# Patient Record
Sex: Male | Born: 1955
Health system: Southern US, Community
[De-identification: ages and names within clinical notes are randomized; demographics above are authoritative.]

## PROBLEM LIST (undated history)

## (undated) DIAGNOSIS — Z720 Tobacco use: Secondary | ICD-10-CM

## (undated) DIAGNOSIS — H409 Unspecified glaucoma: Secondary | ICD-10-CM

## (undated) DIAGNOSIS — K56609 Unspecified intestinal obstruction, unspecified as to partial versus complete obstruction: Secondary | ICD-10-CM

## (undated) DIAGNOSIS — R55 Syncope and collapse: Secondary | ICD-10-CM

## (undated) DIAGNOSIS — E785 Hyperlipidemia, unspecified: Secondary | ICD-10-CM

## (undated) DIAGNOSIS — I2781 Cor pulmonale (chronic): Secondary | ICD-10-CM

## (undated) DIAGNOSIS — F1010 Alcohol abuse, uncomplicated: Secondary | ICD-10-CM

## (undated) DIAGNOSIS — I10 Essential (primary) hypertension: Secondary | ICD-10-CM

## (undated) DIAGNOSIS — T7840XA Allergy, unspecified, initial encounter: Secondary | ICD-10-CM

## (undated) HISTORY — DX: Allergy, unspecified, initial encounter: T78.40XA

## (undated) HISTORY — DX: Essential (primary) hypertension: I10

## (undated) HISTORY — DX: Hyperlipidemia, unspecified: E78.5

## (undated) HISTORY — DX: Syncope and collapse: R55

## (undated) HISTORY — DX: Unspecified glaucoma: H40.9

## (undated) HISTORY — DX: Tobacco use: Z72.0

## (undated) HISTORY — DX: Alcohol abuse, uncomplicated: F10.10

## (undated) HISTORY — DX: Unspecified intestinal obstruction, unspecified as to partial versus complete obstruction: K56.609

## (undated) HISTORY — DX: Cor pulmonale (chronic): I27.81

## (undated) HISTORY — PX: TOOTH EXTRACTION: SUR596

## (undated) HISTORY — PX: ABDOMINAL SURGERY: SHX537

---

## 2001-08-14 ENCOUNTER — Emergency Department (HOSPITAL_COMMUNITY): Admission: EM | Admit: 2001-08-14 | Discharge: 2001-08-14 | Payer: Self-pay | Admitting: Emergency Medicine

## 2003-06-16 ENCOUNTER — Ambulatory Visit (HOSPITAL_COMMUNITY): Admission: RE | Admit: 2003-06-16 | Discharge: 2003-06-16 | Payer: Self-pay | Admitting: General Surgery

## 2003-08-27 HISTORY — PX: PILONIDAL CYST EXCISION: SHX744

## 2003-10-19 ENCOUNTER — Inpatient Hospital Stay (HOSPITAL_COMMUNITY): Admission: AD | Admit: 2003-10-19 | Discharge: 2003-10-24 | Payer: Self-pay | Admitting: General Surgery

## 2004-07-25 ENCOUNTER — Ambulatory Visit: Payer: Self-pay | Admitting: Family Medicine

## 2004-10-23 ENCOUNTER — Ambulatory Visit: Payer: Self-pay | Admitting: Family Medicine

## 2005-03-04 ENCOUNTER — Ambulatory Visit: Payer: Self-pay | Admitting: Family Medicine

## 2005-05-13 ENCOUNTER — Emergency Department (HOSPITAL_COMMUNITY): Admission: EM | Admit: 2005-05-13 | Discharge: 2005-05-13 | Payer: Self-pay | Admitting: Emergency Medicine

## 2005-07-25 ENCOUNTER — Ambulatory Visit: Payer: Self-pay | Admitting: Family Medicine

## 2005-10-03 ENCOUNTER — Ambulatory Visit: Payer: Self-pay | Admitting: Family Medicine

## 2006-02-19 ENCOUNTER — Ambulatory Visit: Payer: Self-pay | Admitting: Family Medicine

## 2006-06-19 ENCOUNTER — Ambulatory Visit: Payer: Self-pay | Admitting: Family Medicine

## 2006-09-02 ENCOUNTER — Emergency Department (HOSPITAL_COMMUNITY): Admission: EM | Admit: 2006-09-02 | Discharge: 2006-09-02 | Payer: Self-pay | Admitting: Emergency Medicine

## 2006-09-05 ENCOUNTER — Emergency Department (HOSPITAL_COMMUNITY): Admission: EM | Admit: 2006-09-05 | Discharge: 2006-09-05 | Payer: Self-pay | Admitting: Emergency Medicine

## 2006-09-25 ENCOUNTER — Ambulatory Visit: Payer: Self-pay | Admitting: Family Medicine

## 2006-10-01 ENCOUNTER — Encounter: Payer: Self-pay | Admitting: Family Medicine

## 2006-10-01 ENCOUNTER — Encounter (INDEPENDENT_AMBULATORY_CARE_PROVIDER_SITE_OTHER): Payer: Self-pay | Admitting: *Deleted

## 2006-10-01 LAB — CONVERTED CEMR LAB
ALT: 13 units/L (ref 0–53)
AST: 17 units/L (ref 0–37)
Albumin: 4.1 g/dL (ref 3.5–5.2)
Alkaline Phosphatase: 101 units/L (ref 39–117)
BUN: 15 mg/dL (ref 6–23)
Basophils Absolute: 0 10*3/uL (ref 0.0–0.1)
Basophils Relative: 1 % (ref 0–1)
Bilirubin, Direct: 0.1 mg/dL (ref 0.0–0.3)
CO2: 26 meq/L (ref 19–32)
Chloride: 102 meq/L (ref 96–112)
Cholesterol: 202 mg/dL — ABNORMAL HIGH (ref 0–200)
Creatinine, Ser: 1.08 mg/dL (ref 0.40–1.50)
Eosinophils Absolute: 0.1 10*3/uL (ref 0.0–0.7)
Eosinophils Relative: 2 % (ref 0–5)
Glucose, Bld: 82 mg/dL (ref 70–99)
HCT: 45 % (ref 39.0–52.0)
HDL: 52 mg/dL (ref >39)
Hemoglobin: 14.5 g/dL (ref 13.0–17.0)
Indirect Bilirubin: 0.5 mg/dL (ref 0.0–0.9)
LDL Cholesterol: 70 mg/dL (ref 0–99)
Lymphocytes Relative: 21 % (ref 12–46)
Lymphs Abs: 1.3 10*3/uL (ref 0.7–3.3)
MCHC: 32.2 g/dL (ref 30.0–36.0)
MCV: 100.9 fL — ABNORMAL HIGH (ref 78.0–100.0)
Monocytes Absolute: 0.7 10*3/uL (ref 0.2–0.7)
Monocytes Relative: 12 % — ABNORMAL HIGH (ref 3–11)
Neutro Abs: 4 10*3/uL (ref 1.7–7.7)
Neutrophils Relative %: 65 % (ref 43–77)
PSA: 1.02 ng/mL (ref 0.10–4.00)
Platelets: 346 10*3/uL (ref 150–400)
Potassium: 4.4 meq/L (ref 3.5–5.3)
RBC: 4.46 M/uL (ref 4.22–5.81)
RDW: 13.4 % (ref 11.5–14.0)
Sodium: 139 meq/L (ref 135–145)
Total Bilirubin: 0.6 mg/dL (ref 0.3–1.2)
Total CHOL/HDL Ratio: 3.9
Total Protein: 7.4 g/dL (ref 6.0–8.3)
Triglycerides: 399 mg/dL — ABNORMAL HIGH (ref <150)
VLDL: 80 mg/dL — ABNORMAL HIGH (ref 0–40)
WBC: 6.1 10*3/uL (ref 4.0–10.5)

## 2006-11-06 ENCOUNTER — Ambulatory Visit: Payer: Self-pay | Admitting: Family Medicine

## 2007-08-19 ENCOUNTER — Ambulatory Visit: Payer: Self-pay | Admitting: Family Medicine

## 2007-08-26 ENCOUNTER — Encounter (INDEPENDENT_AMBULATORY_CARE_PROVIDER_SITE_OTHER): Payer: Self-pay | Admitting: *Deleted

## 2008-01-07 ENCOUNTER — Ambulatory Visit: Payer: Self-pay | Admitting: Family Medicine

## 2008-01-12 DIAGNOSIS — F70 Mild intellectual disabilities: Secondary | ICD-10-CM | POA: Insufficient documentation

## 2008-01-12 DIAGNOSIS — J301 Allergic rhinitis due to pollen: Secondary | ICD-10-CM

## 2008-02-17 ENCOUNTER — Encounter: Payer: Self-pay | Admitting: Family Medicine

## 2008-02-17 LAB — CONVERTED CEMR LAB
BUN: 13 mg/dL (ref 6–23)
CO2: 23 meq/L (ref 19–32)
Calcium: 9.4 mg/dL (ref 8.4–10.5)
Chloride: 107 meq/L (ref 96–112)
Cholesterol: 164 mg/dL (ref 0–200)
Creatinine, Ser: 1.12 mg/dL (ref 0.40–1.50)
Glucose, Bld: 89 mg/dL (ref 70–99)
HDL: 58 mg/dL (ref >39)
LDL Cholesterol: 63 mg/dL (ref 0–99)
PSA: 1.29 ng/mL (ref 0.10–4.00)
Potassium: 4 meq/L (ref 3.5–5.3)
Sodium: 141 meq/L (ref 135–145)
Total CHOL/HDL Ratio: 2.8
Triglycerides: 213 mg/dL — ABNORMAL HIGH (ref <150)
VLDL: 43 mg/dL — ABNORMAL HIGH (ref 0–40)

## 2008-04-01 ENCOUNTER — Encounter: Payer: Self-pay | Admitting: Family Medicine

## 2008-10-19 ENCOUNTER — Ambulatory Visit: Payer: Self-pay | Admitting: Family Medicine

## 2008-10-19 DIAGNOSIS — R079 Chest pain, unspecified: Secondary | ICD-10-CM | POA: Insufficient documentation

## 2009-12-24 DIAGNOSIS — R55 Syncope and collapse: Secondary | ICD-10-CM

## 2009-12-24 HISTORY — DX: Syncope and collapse: R55

## 2010-01-09 ENCOUNTER — Encounter (INDEPENDENT_AMBULATORY_CARE_PROVIDER_SITE_OTHER): Payer: Self-pay | Admitting: Internal Medicine

## 2010-01-09 ENCOUNTER — Inpatient Hospital Stay (HOSPITAL_COMMUNITY): Admission: EM | Admit: 2010-01-09 | Discharge: 2010-01-10 | Payer: Self-pay | Admitting: Emergency Medicine

## 2010-01-12 ENCOUNTER — Encounter: Payer: Self-pay | Admitting: Family Medicine

## 2010-01-15 ENCOUNTER — Ambulatory Visit: Payer: Self-pay | Admitting: Family Medicine

## 2010-01-15 DIAGNOSIS — Z8669 Personal history of other diseases of the nervous system and sense organs: Secondary | ICD-10-CM

## 2010-01-15 DIAGNOSIS — E876 Hypokalemia: Secondary | ICD-10-CM | POA: Insufficient documentation

## 2010-02-01 ENCOUNTER — Encounter: Payer: Self-pay | Admitting: Physician Assistant

## 2010-02-06 ENCOUNTER — Encounter: Payer: Self-pay | Admitting: Physician Assistant

## 2010-02-06 LAB — CONVERTED CEMR LAB
ALT: 11 units/L (ref 0–53)
Albumin: 4 g/dL (ref 3.5–5.2)
Alkaline Phosphatase: 68 units/L (ref 39–117)
BUN: 13 mg/dL (ref 6–23)
Bilirubin, Direct: 0.1 mg/dL (ref 0.0–0.3)
CO2: 19 meq/L (ref 19–32)
Calcium: 9.2 mg/dL (ref 8.4–10.5)
Chloride: 104 meq/L (ref 96–112)
Cholesterol: 199 mg/dL (ref 0–200)
Creatinine, Ser: 1.01 mg/dL (ref 0.40–1.50)
Glucose, Bld: 95 mg/dL (ref 70–99)
HDL: 50 mg/dL (ref >39)
Indirect Bilirubin: 0.6 mg/dL (ref 0.0–0.9)
PSA: 1.46 ng/mL (ref 0.10–4.00)
Potassium: 3.8 meq/L (ref 3.5–5.3)
Sodium: 138 meq/L (ref 135–145)
TSH: 1.549 microintl units/mL (ref 0.350–4.500)
Total Bilirubin: 0.7 mg/dL (ref 0.3–1.2)
Total CHOL/HDL Ratio: 4
Total Protein: 7 g/dL (ref 6.0–8.3)
Triglycerides: 507 mg/dL — ABNORMAL HIGH (ref <150)
Vit D, 25-Hydroxy: 21 ng/mL — ABNORMAL LOW (ref 30–89)

## 2010-02-15 ENCOUNTER — Ambulatory Visit: Payer: Self-pay | Admitting: Family Medicine

## 2010-02-21 DIAGNOSIS — F1020 Alcohol dependence, uncomplicated: Secondary | ICD-10-CM | POA: Insufficient documentation

## 2010-06-05 ENCOUNTER — Ambulatory Visit: Payer: Self-pay | Admitting: Family Medicine

## 2010-06-05 DIAGNOSIS — M545 Low back pain, unspecified: Secondary | ICD-10-CM | POA: Insufficient documentation

## 2010-06-05 LAB — CONVERTED CEMR LAB
Bilirubin Urine: NEGATIVE
Glucose, Urine, Semiquant: NEGATIVE
Ketones, urine, test strip: NEGATIVE
Nitrite: NEGATIVE
Protein, U semiquant: NEGATIVE
Specific Gravity, Urine: >=1.03
Urobilinogen, UA: 0.2
WBC Urine, dipstick: NEGATIVE
pH: 6

## 2010-06-18 ENCOUNTER — Ambulatory Visit: Payer: Self-pay | Admitting: Family Medicine

## 2010-09-25 NOTE — Assessment & Plan Note (Signed)
Summary: BACK   Vital Signs:  Patient profile:   55 year old male Height:      65 inches Weight:      154 pounds BMI:     25.72 O2 Sat:      100 % Pulse rate:   86 / minute Resp:     16 per minute BP sitting:   124 / 80  (left arm)  Vitals Entered By: Everitt Amber LPN (June 05, 2010 1:34 PM) CC: c/o back and both sides hurting, started last week    CC:  c/o back and both sides hurting and started last week .  History of Present Illness: Pt states he noticed lower back and lower abd discomfort since last week.  Started after he was working mowing grass and lifted some tin.  Pain is intermittent.  Pain has improved from last week.  Pt unable to give a number on scale of 1-10. "not that bad" He denies fever, nausea, vomiting, diarrhea, constipaiton, or dysuria.    Current Medications (verified): 1)  None  Allergies (verified): No Known Drug Allergies  Past History:  Past medical history reviewed for relevance to current acute and chronic problems.  Past Medical History: Reviewed history from 01/15/2010 and no changes required. Current Problems:  ALLERGIC RHINITIS, SEASONAL (ICD-477.0) MENTAL RETARDATION, MILD (ICD-317) Syncopal episode 5/11, admitted Alcohol Abuse  Review of Systems General:  Denies chills and fever. ENT:  Denies earache, nasal congestion, and sore throat. CV:  Denies chest pain or discomfort and palpitations. Resp:  Denies cough and shortness of breath. GI:  Complains of abdominal pain; denies bloody stools, change in bowel habits, constipation, dark tarry stools, indigestion, nausea, and vomiting. GU:  Denies dysuria, hematuria, and urinary frequency. MS:  Complains of low back pain; denies joint pain, joint swelling, and mid back pain.  Physical Exam  General:  Well-developed,well-nourished,in no acute distress; alert,appropriate and cooperative throughout examination Head:  Normocephalic and atraumatic without obvious abnormalities. No  apparent alopecia or balding. Lungs:  Normal respiratory effort, chest expands symmetrically. Lungs are clear to auscultation, no crackles or wheezes. Heart:  Normal rate and regular rhythm. S1 and S2 normal without gallop, murmur, click, rub or other extra sounds. Abdomen:  soft, normal bowel sounds, no masses, no guarding, no rigidity, and no rebound tenderness.  Mild TTP across lower abd, but will be tender one time and not the next time that the same area is palpated.  Pt does c/o pain with flexion of abd muscles, and states that this is the same pain he has been getting off & on. Msk:  LS spine:  Bilat lumbar paraspinal muscle TTP, mild.  Nontender spinous processes, and SI joints.  Neg SLR Extremities:  No clubbing, cyanosis, edema, or deformity noted with normal full range of motion of all joints.   Cervical Nodes:  No lymphadenopathy noted Psych:  Cognition and judgment appear intact. Alert and cooperative with normal attention span and concentration. No apparent delusions, illusions, hallucinations   Impression & Recommendations:  Problem # 1:  BACK PAIN, LUMBAR (ICD-724.2) Assessment New  His updated medication list for this problem includes:    Ibuprofen 800 Mg Tabs (Ibuprofen) .Marland Kitchen... Take 1 three times a day as needed back pain  Orders: UA Dipstick w/o Micro (automated)  (81003) Depo- Medrol 80mg  (J1040) Ketorolac-Toradol 15mg  (W2376) Admin of Therapeutic Inj  intramuscular or subcutaneous (28315)  Problem # 2:  MENTAL RETARDATION, MILD (ICD-317) Assessment: Comment Only  Complete Medication List: 1)  Ibuprofen 800 Mg Tabs (Ibuprofen) .... Take 1 three times a day as needed back pain  Other Orders: Influenza Vaccine MCR (27253)  Patient Instructions: 1)  Keep your next scheduled appt. 2)  You have received a flu shot, and 2 shots for your back today. 3)  I have prescribed Ibuprofen to help with your back. 4)  Avoid heavy lifting. 5)  You may use some heat on your back  also as needed for discomfort. Prescriptions: IBUPROFEN 800 MG TABS (IBUPROFEN) take 1 three times a day as needed back pain  #30 x 0   Entered and Authorized by:   Esperanza Sheets PA   Signed by:   Esperanza Sheets PA on 06/05/2010   Method used:   Electronically to        [facility]. [address] [phone]* (retail)       [address] [place], [place]  [address]       Ph: [phone]       Fax: [phone]   RxID:   [id number]     Influenza Vaccine    Vaccine Type: Fluvax MCR    Site: left deltoid    Mfr: [email or web address]    Dose: 0.5 ml    Route: IM    Given by: Everitt Amber LPN    Exp. Date: 12/2010    Lot #: [id number]    Laboratory Results   Urine Tests    Routine Urinalysis   Color: yellow Appearance: Clear Glucose: negative   (Normal Range: Negative) Bilirubin: negative   (Normal Range: Negative) Ketone: negative   (Normal Range: Negative) Spec. Gravity: >=1.030   (Normal Range: 1.003-1.035) Blood: negative   (Normal Range: Negative) pH: 6.0   (Normal Range: 5.0-8.0) Protein: negative   (Normal Range: Negative) Urobilinogen: 0.2   (Normal Range: 0-1) Nitrite: negative   (Normal Range: Negative) Leukocyte Esterace: negative   (Normal Range: Negative)         Medication Administration  Injection # 1:    Medication: Depo- Medrol 80mg     Diagnosis: BACK PAIN, LUMBAR (ICD-724.2)    Route: IM    Site: RUOQ gluteus    Exp Date: 11/2010    Lot #: [id number]     Mfr: Pharmacia    Comments: 80mg  given     Patient tolerated injection without complications    Given by: Everitt Amber LPN (June 05, 2010 2:31 PM)  Injection # 2:    Medication: Ketorolac-Toradol 15mg     Diagnosis: BACK PAIN, LUMBAR (ICD-724.2)    Route: IM    Site: LUOQ gluteus    Exp Date: 06/2011    Lot #: [id number]     Mfr: novaplus    Comments: 60mg  given     Patient tolerated injection without complications    Given by: Everitt Amber LPN (June 05, 2010 2:32 PM)  Orders Added: 1)  Influenza  Vaccine MCR [00025] 2)  UA Dipstick w/o Micro (automated)  [81003] 3)  Depo- Medrol 80mg  [J1040] 4)  Ketorolac-Toradol 15mg  [J1885] 5)  Admin of Therapeutic Inj  intramuscular or subcutaneous [96372] 6)  Est. Patient Level III [57322]

## 2010-09-25 NOTE — Assessment & Plan Note (Signed)
Summary: hospital follow up - room 1   Vital Signs:  Patient profile:   55 year old male Height:      65 inches Weight:      151.75 pounds BMI:     25.34 O2 Sat:      100 % on Room air Pulse rate:   68 / minute Resp:     16 per minute BP sitting:   120 / 28  (left arm)  Vitals Entered By: Adella Hare LPN (Jan 15, 2010 1:48 PM) CC: hospital follow up- room 1 Is Patient Diabetic? No Pain Assessment Patient in pain? no        CC:  hospital follow up- room 1.  History of Present Illness: Pt is here today for hosp f/u.  He was discharged 01/10/10 after a syncopal episode.  It is felt that this occured secondary to his mixing alcohol and vicoden.  He had an EEG which is negative.  He has an appt with the cardiologist on 01-29-10.  He states he has been feeling well since out of the hosp.  No dizziness, lightheadedness or syncope.  No chest pain, palp or difficulty breathing.  He has been having some sneezing and nasal congestion due to his allergies.    Pt states he has resumed drinking ETOH.  He has 3-4 drinks per day of liquer.  I am unable to pinpoint any further with pt or his caregiver exactly how much he is drinking.  When I ask if it is a fifth a day they say no.  When I ask if it's a pint a day, they are uncertain.  Current Medications (verified): 1)  Aspir-Low 81 Mg Tbec (Aspirin) .... One Tab By Mouth Once Daily  Allergies (verified): No Known Drug Allergies  Past History:  Past medical, surgical, family and social histories (including risk factors) reviewed, and no changes noted (except as noted below).  Past Medical History: Current Problems:  ALLERGIC RHINITIS, SEASONAL (ICD-477.0) MENTAL RETARDATION, MILD (ICD-317) Syncopal episode 5/11, admitted Alcohol Abuse  Past Surgical History: Reviewed history from 08/26/2007 and no changes required. Left Inguinal herniorrhaphy I&D rectal abcess 2005  Family History: Reviewed history from 08/26/2007 and no changes  required. FATHER DECEASED - HEART DZ MOTHER DECEASED - DIABETES 0 SIBLINGS  Social History: Reviewed history from 08/26/2007 and no changes required. DISABLED SINGLE 0 CHILDREN Current Smoker Alcohol use-yes,  Drug use-no  Review of Systems CV:  Denies chest pain or discomfort, lightheadness, and palpitations. Resp:  Denies shortness of breath. GI:  Denies abdominal pain, nausea, and vomiting. Neuro:  Denies headaches, numbness, tingling, and weakness.  Physical Exam  General:  Well-developed,well-nourished,in no acute distress; alert,appropriate and cooperative throughout examination Head:  Normocephalic and atraumatic without obvious abnormalities. No apparent alopecia or balding. Ears:  External ear exam shows no significant lesions or deformities.  Otoscopic examination reveals clear canals, tympanic membranes are intact bilaterally without bulging, retraction, inflammation or discharge. Hearing is grossly normal bilaterally. Nose:  External nasal examination shows no deformity or inflammation. Nasal mucosa are pink and moist without lesions or exudates. Mouth:  Oral mucosa and oropharynx without lesions or exudates.  poor dentition and teeth missing.   Neck:  No deformities, masses, or tenderness noted. Lungs:  Normal respiratory effort, chest expands symmetrically. Lungs are clear to auscultation, no crackles or wheezes. Heart:  Normal rate and regular rhythm. S1 and S2 normal without gallop, murmur, click, rub or other extra sounds. Abdomen:  soft, non-tender, no masses, no  hepatomegaly, and no splenomegaly.   Cervical Nodes:  No lymphadenopathy noted Psych:  normally interactive, good eye contact, and not anxious appearing.     Impression & Recommendations:  Problem # 1:  SYNCOPE, HX OF (ICD-V12.49) Assessment Comment Only  Orders: T-Basic Metabolic Panel [id number]) T-TSH [phone])  Problem # 2:  HYPOKALEMIA (ICD-276.8) Assessment: Comment Only Hypokalemic  at admission.  Needs f/u labs.  Orders: T-Electrolytes [id number])  Problem # 3:  ALLERGIC RHINITIS, SEASONAL (ICD-477.0) Assessment: Deteriorated  Complete Medication List: 1)  Aspir-low 81 Mg Tbec (Aspirin) .... One tab by mouth once daily 2)  Fexofenadine Hcl 180 Mg Tabs (Fexofenadine hcl) .... Take 1 daily for allergies  Other Orders: T-Lipid Profile [id number]) T-Vitamin D (25-Hydroxy) [id number]) T-PSA [phone])  Patient Instructions: 1)  Please schedule a follow-up appointment in 1 month wit Dr Lodema Hong. 2)  Keep your appt June 6th with the cardiologist. 3)  I have ordered blood work for you.  This needs to be drawn fasting. 4)  It is not healthy  for men to drink more than 2-3 drinks per day or for women to drink more than 1-2 drinks per day. Prescriptions: FEXOFENADINE HCL 180 MG TABS (FEXOFENADINE HCL) take 1 daily for allergies  #30 x 3   Entered and Authorized by:   Esperanza Sheets PA   Signed by:   Esperanza Sheets PA on 01/15/2010   Method used:   Electronically to        [facility]. [address] [phone]* (retail)       [address] [place], [place]  [address]       Ph: [phone]       Fax: [phone]   RxID:   [id number]

## 2010-09-25 NOTE — Letter (Signed)
Summary: Letter  Letter   Imported By: Lind Guest 01/12/2010 10:18:40  _____________________________________________________________________  External Attachment:    Type:   Image     Comment:   External Document

## 2010-09-25 NOTE — Assessment & Plan Note (Signed)
Summary: F UP   Vital Signs:  Patient profile:   55 year old male Height:      65 inches Weight:      149 pounds BMI:     24.88 O2 Sat:      98 % Pulse rate:   77 / minute Pulse rhythm:   irregular Resp:     16 per minute BP sitting:   124 / 82  (left arm) Cuff size:   regular  Vitals Entered By: Everitt Amber LPN (February 15, 2010 3:25 PM) CC: Follow up chronic problems   CC:  Follow up chronic problems.  History of Present Illness: Pt in for f/u from recent hospitalisation for alcohol intoxication. He remains in denia about this problem unfoertunately, however i stroongly encourged him to join an AA program.  Denies recent fever or chills. Denies sinus pressure, nasal congestion , ear pain or sore throat. Denies chest congestion, or cough productive of sputum. Denies chest pain, palpitations, PND, orthopnea or leg swelling. Denies abdominal pain, nausea, vomitting, diarrhea or constipation. Denies change in bowel movements or bloody stool. Denies dysuria , frequency, incontinence or hesitancy. Denies  joint pain, swelling, or reduced mobility. Denies headaches, vertigo, seizures. Denies depression, anxiety or insomnia. Denies  rash, lesions, or itch.     Preventive Screening-Counseling & Management  Alcohol-Tobacco     Smoking Cessation Counseling: yes  Current Medications (verified): 1)  None  Allergies (verified): No Known Drug Allergies  Review of Systems      See HPI Eyes:  Denies discharge and red eye. Neuro:  Complains of memory loss; mild mntal retardation. Endo:  Denies cold intolerance, excessive hunger, excessive thirst, excessive urination, heat intolerance, polyuria, and weight change. Heme:  Denies abnormal bruising and bleeding. Allergy:  Complains of seasonal allergies; mild.  Physical Exam  General:  Well-developed,well-nourished,in no acute distress; alert,appropriate and cooperative throughout examination HEENT: No facial asymmetry,  EOMI,  No sinus tenderness, TM's Clear, oropharynx  pink and moist. Poor dentition  Chest: Clear to auscultation bilaterally.  CVS: S1, S2, No murmurs, No S3.   Abd: Soft, Nontender.  MS: Adequate ROM spine, hips, shoulders and knees.  Ext: No edema.   CNS: CN 2-12 intact, power tone and sensation normal throughout.   Skin: Intact, no visible lesions or rashes.  Psych: Good eye contact, normal affect.  Memory impairment, not anxious or depressed appearing.    Impression & Recommendations:  Problem # 1:  ALLERGIC RHINITIS, SEASONAL (ICD-477.0) Assessment Improved  Problem # 2:  ALCOHOLISM (ICD-303.90) Assessment: Comment Only counselled re need to quit and join a saupport gp, insight seems to be poor  Problem # 3:  HYPERLIPIDEMIA (ICD-272.4) Assessment: Deteriorated  Labs Reviewed: SGOT: 21 (02/06/2010)   SGPT: 11 (02/06/2010)   HDL:50 (02/01/2010), 58 (02/17/2008)  LDL:See Comment mg/dL (16/05/9603), 63 (54/04/8118)  Chol:199 (02/01/2010), 164 (02/17/2008)  Trig:507 (02/01/2010), 213 (02/17/2008) low fat diet discussed and encouraged  Problem # 4:  NICOTINE ADDICTION (ICD-305.1) Assessment: Unchanged  Encouraged smoking cessation and discussed different methods for smoking cessation.   Patient Instructions: 1)  Please schedule a follow-up appointment in 4 months. 2)  It is not healthy  for men to drink more than 2-3 drinks per day or for women to drink more than 1-2 drinks per day. 3)  Tobacco is very bad for your health and your loved ones! You Should stop smoking!. 4)  Stop Smoking Tips: Choose a Quit date. Cut down before the Quit date. decide what  you will do as a substitute when you feel the urge to smoke(gum,toothpick,exercise). 5)  continue your allergy pills. 6)  You need to go to AA.

## 2010-11-12 LAB — RAPID URINE DRUG SCREEN, HOSP PERFORMED
Amphetamines: NOT DETECTED
Barbiturates: NOT DETECTED
Benzodiazepines: NOT DETECTED
Opiates: POSITIVE — AB
Tetrahydrocannabinol: NOT DETECTED

## 2010-11-12 LAB — BASIC METABOLIC PANEL
BUN: 7 mg/dL (ref 6–23)
BUN: 7 mg/dL (ref 6–23)
CO2: 27 mEq/L (ref 19–32)
Calcium: 9.1 mg/dL (ref 8.4–10.5)
Chloride: 108 mEq/L (ref 96–112)
GFR calc non Af Amer: >60 mL/min (ref >60)
Glucose, Bld: 113 mg/dL — ABNORMAL HIGH (ref 70–99)
Potassium: 3.5 mEq/L (ref 3.5–5.1)
Sodium: 138 mEq/L (ref 135–145)
Sodium: 139 mEq/L (ref 135–145)

## 2010-11-12 LAB — CBC
HCT: 32.4 % — ABNORMAL LOW (ref 39.0–52.0)
Hemoglobin: 11.6 g/dL — ABNORMAL LOW (ref 13.0–17.0)
MCHC: 35.3 g/dL (ref 30.0–36.0)
MCV: 98.1 fL (ref 78.0–100.0)
MCV: 99.7 fL (ref 78.0–100.0)
Platelets: 248 10*3/uL (ref 150–400)
Platelets: 259 10*3/uL (ref 150–400)
RBC: 3.3 MIL/uL — ABNORMAL LOW (ref 4.22–5.81)
RBC: 3.8 MIL/uL — ABNORMAL LOW (ref 4.22–5.81)
RDW: 14.8 % (ref 11.5–15.5)
WBC: 6.6 10*3/uL (ref 4.0–10.5)

## 2010-11-12 LAB — COMPREHENSIVE METABOLIC PANEL
AST: 19 U/L (ref 0–37)
CO2: 25 mEq/L (ref 19–32)
Calcium: 9 mg/dL (ref 8.4–10.5)
Creatinine, Ser: 1.47 mg/dL (ref 0.4–1.5)
GFR calc Af Amer: >60 mL/min (ref >60)
GFR calc non Af Amer: 50 mL/min — ABNORMAL LOW (ref >60)
Sodium: 138 mEq/L (ref 135–145)
Total Protein: 6.8 g/dL (ref 6.0–8.3)

## 2010-11-12 LAB — DIFFERENTIAL
Eosinophils Absolute: 0.1 10*3/uL (ref 0.0–0.7)
Eosinophils Relative: 1 % (ref 0–5)
Eosinophils Relative: 1 % (ref 0–5)
Lymphocytes Relative: 15 % (ref 12–46)
Lymphocytes Relative: 18 % (ref 12–46)
Lymphs Abs: 1.2 10*3/uL (ref 0.7–4.0)
Lymphs Abs: 1.3 10*3/uL (ref 0.7–4.0)
Monocytes Absolute: 0.6 10*3/uL (ref 0.1–1.0)
Monocytes Relative: 5 % (ref 3–12)
Monocytes Relative: 9 % (ref 3–12)

## 2010-11-12 LAB — CARDIAC PANEL(CRET KIN+CKTOT+MB+TROPI)
CK, MB: 1.1 ng/mL (ref 0.3–4.0)
CK, MB: 1.2 ng/mL (ref 0.3–4.0)
Relative Index: 1 (ref 0.0–2.5)
Total CK: 107 U/L (ref 7–232)
Total CK: 113 U/L (ref 7–232)

## 2010-11-12 LAB — TSH: TSH: 2.584 u[IU]/mL (ref 0.350–4.500)

## 2010-11-12 LAB — CK TOTAL AND CKMB (NOT AT ARMC): Total CK: 141 U/L (ref 7–232)

## 2010-11-12 LAB — POCT CARDIAC MARKERS
CKMB, poc: <1 ng/mL — ABNORMAL LOW (ref 1.0–8.0)
Myoglobin, poc: 77.2 ng/mL (ref 12–200)

## 2010-11-12 LAB — ACETAMINOPHEN LEVEL: Acetaminophen (Tylenol), Serum: 10.5 ug/mL (ref 10–30)

## 2010-11-12 LAB — GLUCOSE, CAPILLARY: Glucose-Capillary: 190 mg/dL — ABNORMAL HIGH (ref 70–99)

## 2010-11-12 LAB — HEMOGLOBIN A1C: Hgb A1c MFr Bld: 5.9 % — ABNORMAL HIGH (ref <5.7)

## 2010-11-12 LAB — MAGNESIUM: Magnesium: 1.8 mg/dL (ref 1.5–2.5)

## 2011-01-11 NOTE — H&P (Signed)
NAME:  Alexander Simmons, Alexander Simmons                          ACCOUNT NO.:  [id number]   MEDICAL RECORD NO.:  [id number]                   PATIENT TYPE:  INP   LOCATION:  A325                                 FACILITY:  [facility]   PHYSICIAN:  Dirk Dress. Katrinka Blazing, M.D.                DATE OF BIRTH:  Aug 25, 1956   DATE OF ADMISSION:  10/19/2003  DATE OF DISCHARGE:                                HISTORY & PHYSICAL   This is a 55 year old male with a history of a painful mass of his left  buttock for 2 weeks.  The mass is becoming increasingly painful.  He noticed  some drainage on the evening prior to admission.  He was seen in the office  where he was noted to have a large bulging abscess of the left buttock with  extension down to the perirectal space.  It is felt to probably be a large  pilonidal, but it may be a primary perirectal abscess with extension to the  buttock.  The patient is admitted.  Will have IV antibiotics and will be  scheduled for wide excision under anesthesia.   PAST HISTORY:  He has no history of similar inflammatory disease of the  perianal area.  He had a colonoscopy in October 2004, with a single polyp  noted.  He uses no medications except for eyedrops for glaucoma.  He does  not admit to any medical problems though there is a prolonged history of  alcohol and drug abuse.   SURGERY:  None.   SOCIAL HISTORY:  He is single, disabled, with a history of alcohol and  tobacco abuse.  He drinks a 6-pack of beer a day.  Smokes about 3 cigars per  day.  He chews tobacco daily.  The source of his disability is not known.  He does not know why he has been declared disabled.   FAMILY HISTORY:  Not known.   ALLERGIES:  He has no known allergies.   REVIEW OF SYSTEMS:  Very difficult to obtain.  He does not admit to any  problems.   PHYSICAL EXAMINATION:  GENERAL:  Middle-aged male who appears in no acute  distress though he walks bent over.  VITAL SIGNS:  Blood pressure 126/84, pulse 84,  respirations 20.  Weight 140-  1/2 pounds.  HEENT:  Unremarkable except for inflammation of the sclerae and  conjunctivae.  NECK:  Supple.  No JVD or bruit.  No adenopathy or thyromegaly.  CHEST:  Clear to auscultation.  No rales, rubs, rhonchi, or wheezes.  HEART:  Regular rate and rhythm.  Without murmur, gallop, or rub.  ABDOMEN:  Soft and nontender.  No masses.  Normoactive bowel sounds.  RECTAL:  In the perirectal area he has some inflammation extending from the  medial aspect of the left buttock down to the posterior perirectal space  with a pointing draining mass with surrounding inflammation along the medial  aspect of the buttock.  EXTREMITIES:  No cyanosis, clubbing, or edema.  NEUROLOGIC:  No focal motor, sensory, or cerebellar deficit.   IMPRESSION:  Perianal inflammatory disease, probably due to pilonidal  abscess but possibly a perirectal abscess with posterior extension.   PLAN:  The patient will receive IV antibiotics.  Will get baseline  laboratories.  He will receive IV analgesics, and he will have wide excision  of the area under anesthesia on the morning of October 20, 2003.     ___________________________________________                                         Dirk Dress. Katrinka Blazing, M.D.   LCS/MEDQ  D:  10/19/2003  T:  10/20/2003  Job:  [id number]

## 2011-01-11 NOTE — Discharge Summary (Signed)
NAME:  Alexander Simmons, Alexander Simmons                          ACCOUNT NO.:  [id number]   MEDICAL RECORD NO.:  [id number]                   PATIENT TYPE:  INP   LOCATION:  A325                                 FACILITY:  [facility]   PHYSICIAN:  Dirk Dress. Katrinka Blazing, M.D.                DATE OF BIRTH:  Feb 04, 1956   DATE OF ADMISSION:  10/19/2003  DATE OF DISCHARGE:  10/24/2003                                 DISCHARGE SUMMARY   DISCHARGE DIAGNOSES:  1. Pilonidal abscess.  2. Chronic alcohol abuse.   SPECIAL PROCEDURE:  Wide excision of pilonidal abscess on February 24.   DISPOSITION:  The patient is discharged home in stable, satisfactory  condition.   DISCHARGE MEDICATIONS:  Doxycycline 100 mg b.i.d.   DISCHARGE INSTRUCTIONS:  1. He was instructed in wound care.  2. He was scheduled to be followed up in the office in two weeks.  3. He is to continue the pain medicine that he had preoperatively.   SUMMARY:  A 55 year old male with a history of a painful mass of his left  buttocks for two weeks.  He noted draining on the evening prior to  admission.  He was seen in the office and was noted to have a large bulging  abscess of the left buttocks with extension down to the perirectal space.  It was felt that he had a large pilonidal with perirectal extension.  It was  felt this needed to be approached in the OR.  He was admitted and started on  IV antibiotics.  He was scheduled for Wide excision.   General exam was otherwise unremarkable.  The patient underwent Wide  excision of a pilonidal abscess on February 24.  The wound was left open,  and local wound care was carried out.  He was given daily wound care.  He  had no problems while hospitalized.  The wound showed evidence of healing.   DISCHARGE CONDITION:  He was discharged home in satisfactory condition on  February 28.     ___________________________________________                                         Dirk Dress. Katrinka Blazing, M.D.   LCS/MEDQ  D:   11/19/2003  T:  11/20/2003  Job:  [id number]

## 2011-01-11 NOTE — Op Note (Signed)
NAME:  Alexander Simmons, Alexander Simmons                          ACCOUNT NO.:  [id number]   MEDICAL RECORD NO.:  [id number]                   PATIENT TYPE:  INP   LOCATION:  A325                                 FACILITY:  [facility]   PHYSICIAN:  Dirk Dress. Katrinka Blazing, M.D.                DATE OF BIRTH:  06/19/1956   DATE OF PROCEDURE:  10/20/2003  DATE OF DISCHARGE:                                 OPERATIVE REPORT   PREOPERATIVE DIAGNOSIS:  Pilonidal abscess.   POSTOPERATIVE DIAGNOSIS:  Pilonidal abscess.   PROCEDURE:  Wide excision of pilonidal abscess.   SURGEON:  Dirk Dress. Katrinka Blazing, M.D.   DESCRIPTION OF PROCEDURE:  Under spinal anesthesia, the presacral left  medial buttock was prepped and draped in a sterile field.   Wide excision of the roof of the bulging cavity was carried out.  Cultures  of the large volume of purulent material was removed.  Irrigation was  carried out.  Fractionation of loculations of pus were carried out.  Further  irrigation was carried out.  The walls of the abscess cavity were curetted  with a standard medium-size curette.  Hemostasis was achieved.  Further  irrigation was carried out.  The pocket was dissected down to the midline,  but the actual opening from the midline was never found.  The widely opened  area was packed with iodoform gauze.  It was covered with an ABD pad and 4 x  4s.   The patient tolerated the procedure well.  He was transferred to a bed and  taken to the postanesthetic care unit for further monitoring.      ___________________________________________                                            Dirk Dress. Katrinka Blazing, M.D.   LCS/MEDQ  D:  10/20/2003  T:  10/20/2003  Job:  [id number]

## 2011-01-11 NOTE — H&P (Signed)
   NAME:  Alexander Simmons, Alexander Simmons.                         ACCOUNT NO.:  [id number]   MEDICAL RECORD NO.:  [id number]                  PATIENT TYPE:   LOCATION:                                       FACILITY:   PHYSICIAN:  Jerolyn Shin C. Katrinka Blazing, M.D.                DATE OF BIRTH:   DATE OF ADMISSION:  DATE OF DISCHARGE:                                HISTORY & PHYSICAL   HISTORY OF PRESENT ILLNESS:  A 55 year old __________ referred by Dr.  Lodema Hong for screen colonoscopy. He had some weight loss but there has been  no history of rectal bleeding, no abdominal pain, and there is a negative  family history of colon cancer.   PAST MEDICAL HISTORY:  He has no known medical illness, though he is  disabled.   MEDICATIONS:  He takes no chronic medications.   PAST SURGICAL HISTORY:  None.   SOCIAL HISTORY:  He smokes cigarettes and uses alcohol.   PHYSICAL EXAMINATION:  VITAL SIGNS:  Blood pressure 108/68, pulse 76,  respiratory rate 20. Weight 144 pounds.  HEENT:  Unremarkable.  NECK:  Supple.  CHEST:  Clear.  HEART:  Regular rate and rhythm. Without murmur, rub, or gallop.  ABDOMEN:  Soft, nontender, no masses.  RECTAL:  Normal. Stool guaiac negative.  EXTREMITIES:  No clubbing, cyanosis, or edema.  NEUROLOGIC:  No focal motor, sensory, or cerebellar deficits.   IMPRESSION:  Anorexia and weight loss with need for a screening colonoscopy.   PLAN:  Colonoscopy.     ___________________________________________                                         Dirk Dress. Katrinka Blazing, M.D.   LCS/MEDQ  D:  06/15/2003  T:  06/16/2003  Job:  [id number]

## 2013-01-05 ENCOUNTER — Encounter: Payer: Self-pay | Admitting: Family Medicine

## 2013-01-05 ENCOUNTER — Encounter (INDEPENDENT_AMBULATORY_CARE_PROVIDER_SITE_OTHER): Payer: Self-pay | Admitting: *Deleted

## 2013-01-05 ENCOUNTER — Ambulatory Visit (INDEPENDENT_AMBULATORY_CARE_PROVIDER_SITE_OTHER): Payer: Medicare Other | Admitting: Family Medicine

## 2013-01-05 VITALS — BP 140/82 | HR 86 | Resp 18 | Ht 64.0 in | Wt 154.0 lb

## 2013-01-05 DIAGNOSIS — E785 Hyperlipidemia, unspecified: Secondary | ICD-10-CM

## 2013-01-05 DIAGNOSIS — R0789 Other chest pain: Secondary | ICD-10-CM

## 2013-01-05 DIAGNOSIS — J301 Allergic rhinitis due to pollen: Secondary | ICD-10-CM

## 2013-01-05 DIAGNOSIS — R9431 Abnormal electrocardiogram [ECG] [EKG]: Secondary | ICD-10-CM

## 2013-01-05 DIAGNOSIS — Z1211 Encounter for screening for malignant neoplasm of colon: Secondary | ICD-10-CM

## 2013-01-05 DIAGNOSIS — Z139 Encounter for screening, unspecified: Secondary | ICD-10-CM

## 2013-01-05 DIAGNOSIS — Z720 Tobacco use: Secondary | ICD-10-CM

## 2013-01-05 DIAGNOSIS — F1020 Alcohol dependence, uncomplicated: Secondary | ICD-10-CM

## 2013-01-05 DIAGNOSIS — F17200 Nicotine dependence, unspecified, uncomplicated: Secondary | ICD-10-CM

## 2013-01-05 DIAGNOSIS — R002 Palpitations: Secondary | ICD-10-CM | POA: Insufficient documentation

## 2013-01-05 DIAGNOSIS — Z125 Encounter for screening for malignant neoplasm of prostate: Secondary | ICD-10-CM

## 2013-01-05 MED ORDER — FLUTICASONE PROPIONATE 50 MCG/ACT NA SUSP: 1.0000 | Freq: Every day | NASAL | Status: DC

## 2013-01-05 MED ORDER — ASPIRIN EC 81 MG PO TBEC: 81.0000 mg | DELAYED_RELEASE_TABLET | Freq: Every day | ORAL | Status: AC

## 2013-01-05 NOTE — Patient Instructions (Addendum)
Annual wellness in October.  EKG today due to c/o palpitations at times You need to stop snuff and reduce alcohol.  Keep active, weight loss needed   You are referred for colonoscopy  Start aspirin 81 mg one daily  Fasting lipid, cmp, cbc. TSH, PSA as soon as possible  You are referred for cardiology evaluation due to abnormal EKG and your symptoms

## 2013-01-06 DIAGNOSIS — R9431 Abnormal electrocardiogram [ECG] [EKG]: Secondary | ICD-10-CM | POA: Insufficient documentation

## 2013-01-08 LAB — CBC
HCT: 41.9 % (ref 39.0–52.0)
Hemoglobin: 14.2 g/dL (ref 13.0–17.0)
MCH: 32.4 pg (ref 26.0–34.0)
MCV: 95.7 fL (ref 78.0–100.0)
Platelets: 294 10*3/uL (ref 150–400)
WBC: 3.5 10*3/uL — ABNORMAL LOW (ref 4.0–10.5)

## 2013-01-08 LAB — PSA, MEDICARE: PSA: 1.18 ng/mL (ref <=4.00)

## 2013-01-08 LAB — LIPID PANEL
HDL: 43 mg/dL (ref >39)
LDL Cholesterol: 86 mg/dL (ref 0–99)
Total CHOL/HDL Ratio: 4.8 Ratio

## 2013-01-13 ENCOUNTER — Encounter (INDEPENDENT_AMBULATORY_CARE_PROVIDER_SITE_OTHER): Payer: Self-pay | Admitting: *Deleted

## 2013-01-13 ENCOUNTER — Ambulatory Visit (INDEPENDENT_AMBULATORY_CARE_PROVIDER_SITE_OTHER): Payer: Medicare Other | Admitting: Internal Medicine

## 2013-01-13 ENCOUNTER — Other Ambulatory Visit (INDEPENDENT_AMBULATORY_CARE_PROVIDER_SITE_OTHER): Payer: Self-pay | Admitting: *Deleted

## 2013-01-13 ENCOUNTER — Encounter (INDEPENDENT_AMBULATORY_CARE_PROVIDER_SITE_OTHER): Payer: Self-pay | Admitting: Internal Medicine

## 2013-01-13 VITALS — BP 124/78 | HR 76 | Ht 64.0 in | Wt 155.9 lb

## 2013-01-13 DIAGNOSIS — Z1211 Encounter for screening for malignant neoplasm of colon: Secondary | ICD-10-CM

## 2013-01-13 NOTE — Patient Instructions (Addendum)
Screening colonoscopy.The risks and benefits such as perforation, bleeding, and infection were reviewed with the patient and is agreeable. 

## 2013-01-13 NOTE — Telephone Encounter (Signed)
This encounter was created in error - please disregard.

## 2013-01-13 NOTE — Progress Notes (Signed)
Subjective:     Patient ID: Alexander Simmons, male   DOB: 1956-08-03, 57 y.o.   MRN: [id number]  HPI Referred to our office for a colonoscopy. He has never undergone a colonoscopy in the past. Appetite is good. No dysphagia. No acid reflux.  NO abdominal pain. He has a BM daily. No melena or bright red rectal bleeding.  No hematemesis.  No GI problems. He feels good. He is unemployed. He has been  disabled since age 14. 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   CBC    Component Value Date/Time   WBC 3.5* 01/08/2013 1045   RBC 4.38 01/08/2013 1045   HGB 14.2 01/08/2013 1045   HCT 41.9 01/08/2013 1045   PLT 294 01/08/2013 1045   MCV 95.7 01/08/2013 1045   MCH 32.4 01/08/2013 1045   MCHC 33.9 01/08/2013 1045   RDW 14.0 01/08/2013 1045   LYMPHSABS 1.2 01/09/2010 0558   MONOABS 0.6 01/09/2010 0558   EOSABS 0.1 01/09/2010 0558   BASOSABS 0.1 01/09/2010 0558     Review of Systems see hpi Current Outpatient Prescriptions  Medication Sig Dispense Refill  . aspirin EC 81 MG tablet Take 1 tablet (81 mg total) by mouth daily.  150 tablet  2   No current facility-administered medications for this visit.   Past Medical History  Diagnosis Date  . Hyperlipidemia   . Allergy    Past Surgical History  Procedure Laterality Date  . Tooth extraction    . Cyst excision      buttocks   No Known Allergies       Objective:   Physical Exam  Filed Vitals:   01/13/13 1521  BP: 124/78  Pulse: 76  Height: 5\' 4"  (1.626 m)  Weight: 155 lb 14.4 oz (70.716 kg)   Alert and oriented. Skin warm and dry. Oral mucosa is moist.   . Sclera anicteric, conjunctivae is pink. Thyroid not enlarged. No cervical lymphadenopathy. Lungs clear. Heart regular rate and rhythm.  Abdomen is soft. Bowel sounds are positive. No hepatomegaly. No abdominal masses felt. No tenderness.  No edema to lower extremities.       Assessment:    Normal exam. Screening colonoscopy Alcohol abuse    Plan:    Screening  colonoscopy. The risks and benefits such as perforation, bleeding, and infection were reviewed with the patient and is agreeable.   Patient advised he needs to stop drinking.

## 2013-01-20 ENCOUNTER — Encounter: Payer: Self-pay | Admitting: Cardiology

## 2013-01-20 ENCOUNTER — Ambulatory Visit (INDEPENDENT_AMBULATORY_CARE_PROVIDER_SITE_OTHER): Payer: Medicare Other | Admitting: Cardiology

## 2013-01-20 ENCOUNTER — Encounter: Payer: Self-pay | Admitting: *Deleted

## 2013-01-20 VITALS — BP 150/93 | HR 75 | Ht 64.0 in | Wt 153.8 lb

## 2013-01-20 DIAGNOSIS — Z9289 Personal history of other medical treatment: Secondary | ICD-10-CM | POA: Insufficient documentation

## 2013-01-20 DIAGNOSIS — E785 Hyperlipidemia, unspecified: Secondary | ICD-10-CM | POA: Insufficient documentation

## 2013-01-20 DIAGNOSIS — Z720 Tobacco use: Secondary | ICD-10-CM | POA: Insufficient documentation

## 2013-01-20 DIAGNOSIS — R9431 Abnormal electrocardiogram [ECG] [EKG]: Secondary | ICD-10-CM

## 2013-01-20 DIAGNOSIS — R079 Chest pain, unspecified: Secondary | ICD-10-CM

## 2013-01-20 DIAGNOSIS — R002 Palpitations: Secondary | ICD-10-CM

## 2013-01-20 DIAGNOSIS — Z9189 Other specified personal risk factors, not elsewhere classified: Secondary | ICD-10-CM

## 2013-01-20 NOTE — Progress Notes (Deleted)
Name: Alexander Simmons    DOB: 14-Nov-1955  Age: 57 y.o.  MR#: [id number]       PCP:  Syliva Overman, MD      Insurance: Payor: MEDICARE / Plan: MEDICARE PART A AND B / Product Type: *No Product type* /   CC:   No chief complaint on file. NO LIST  VS Filed Vitals:   01/20/13 1443  BP: 150/93  Pulse: 75  Height: 5\' 4"  (1.626 m)  Weight: 153 lb 12 oz (69.741 kg)    Weights Current Weight  01/20/13 153 lb 12 oz (69.741 kg)  01/13/13 155 lb 14.4 oz (70.716 kg)  01/05/13 154 lb 0.6 oz (69.872 kg)    Blood Pressure  BP Readings from Last 3 Encounters:  01/20/13 150/93  01/13/13 124/78  01/05/13 140/82     Admit date:  (Not on file) Last encounter with RMR:  Visit date not found   Allergy Review of patient's allergies indicates no known allergies.  Current Outpatient Prescriptions  Medication Sig Dispense Refill  . aspirin EC 81 MG tablet Take 1 tablet (81 mg total) by mouth daily.  150 tablet  2   No current facility-administered medications for this visit.    Discontinued Meds:   There are no discontinued medications.  Patient Active Problem List   Diagnosis Date Noted  . Hyperlipidemia   . Tobacco abuse   . Palpitations 01/05/2013  . BACK PAIN, LUMBAR 06/05/2010  . ALCOHOLISM 02/21/2010  . MENTAL RETARDATION, MILD 01/12/2008  . ALLERGIC RHINITIS, SEASONAL 01/12/2008    LABS    Component Value Date/Time   NA 138 02/01/2010 1845   NA 139 01/10/2010 0420   NA 138 01/09/2010 0558   K 3.8 02/01/2010 1845   K 4.6 DELTA CHECK NOTED 01/10/2010 0420   K 3.5 01/09/2010 0558   CL 104 02/01/2010 1845   CL 108 01/10/2010 0420   CL 108 01/09/2010 0558   CO2 19 02/01/2010 1845   CO2 27 01/10/2010 0420   CO2 24 01/09/2010 0558   GLUCOSE 95 02/01/2010 1845   GLUCOSE 113* 01/10/2010 0420   GLUCOSE 109* 01/09/2010 0558   BUN 13 02/01/2010 1845   BUN 7 01/10/2010 0420   BUN 7 01/09/2010 0558   CREATININE 1.01 02/01/2010 1845   CREATININE 1.06 01/10/2010 0420   CREATININE 1.07 01/09/2010 0558   CALCIUM 9.2 02/01/2010 1845   CALCIUM 9.1 01/10/2010 0420   CALCIUM 8.4 01/09/2010 0558   GFRNONAA >60 01/10/2010 0420   GFRNONAA >60 01/09/2010 0558   GFRNONAA 50* 01/08/2010 1934   GFRAA  Value: >60        The eGFR has been calculated using the MDRD equation. This calculation has not been validated in all clinical situations. eGFR's persistently <60 mL/min signify possible Chronic Kidney Disease. 01/10/2010 0420   GFRAA  Value: >60        The eGFR has been calculated using the MDRD equation. This calculation has not been validated in all clinical situations. eGFR's persistently <60 mL/min signify possible Chronic Kidney Disease. 01/09/2010 0558   GFRAA  Value: >60        The eGFR has been calculated using the MDRD equation. This calculation has not been validated in all clinical situations. eGFR's persistently <60 mL/min signify possible Chronic Kidney Disease. 01/08/2010 1934   CMP     Component Value Date/Time   NA 138 02/01/2010 1845   K 3.8 02/01/2010 1845   CL 104 02/01/2010 1845  CO2 19 02/01/2010 1845   GLUCOSE 95 02/01/2010 1845   BUN 13 02/01/2010 1845   CREATININE 1.01 02/01/2010 1845   CALCIUM 9.2 02/01/2010 1845   PROT 7.0 02/06/2010 1305   ALBUMIN 4.0 02/06/2010 1305   AST 21 02/06/2010 1305   ALT 11 02/06/2010 1305   ALKPHOS 68 02/06/2010 1305   BILITOT 0.7 02/06/2010 1305   GFRNONAA >60 01/10/2010 0420   GFRAA  Value: >60        The eGFR has been calculated using the MDRD equation. This calculation has not been validated in all clinical situations. eGFR's persistently <60 mL/min signify possible Chronic Kidney Disease. 01/10/2010 0420       Component Value Date/Time   WBC 3.5* 01/08/2013 1045   WBC 6.6 01/09/2010 0558   WBC 8.9 01/08/2010 1934   HGB 14.2 01/08/2013 1045   HGB 11.6* 01/09/2010 0558   HGB 13.4 01/08/2010 1934   HCT 41.9 01/08/2013 1045   HCT 32.4* 01/09/2010 0558   HCT 37.8* 01/08/2010 1934   MCV 95.7 01/08/2013 1045   MCV 98.1 01/09/2010 0558   MCV 99.7 01/08/2010 1934    Lipid  Panel     Component Value Date/Time   CHOL 208* 01/08/2013 1045   TRIG 394* 01/08/2013 1045   HDL 43 01/08/2013 1045   CHOLHDL 4.8 01/08/2013 1045   VLDL 79* 01/08/2013 1045   LDLCALC 86 01/08/2013 1045    ABG No results found for this basename: phart, pco2, pco2art, po2, po2art, hco3, tco2, acidbasedef, o2sat     Lab Results  Component Value Date   TSH 1.454 01/08/2013   BNP (last 3 results) No results found for this basename: PROBNP,  in the last 8760 hours Cardiac Panel (last 3 results) No results found for this basename: CKTOTAL, CKMB, TROPONINI, RELINDX,  in the last 72 hours  Iron/TIBC/Ferritin No results found for this basename: iron, tibc, ferritin     EKG No orders found for this or any previous visit.   Prior Assessment and Plan Problem List as of 01/20/2013   ALCOHOLISM   MENTAL RETARDATION, MILD   ALLERGIC RHINITIS, SEASONAL   BACK PAIN, LUMBAR   Palpitations   Hyperlipidemia   Tobacco abuse       Imaging: No results found.

## 2013-01-20 NOTE — Progress Notes (Signed)
Patient ID: Alexander Simmons, male   DOB: [birth year], 57 y.o.   MRN: [id number] HPI: Initial Cardiology evaluation for this very pleasant gentleman referred by Kerri Perches, MD for assessment of palpitations.  Patient has a history of intellectual impairment and he is a man of few words. He describes intermittent palpitations, the time course of which is difficult to define. He denies chest discomfort. He notes minimal dyspnea, probably related to moderate to strenuous activity. Lifestyle is relatively sedentary, but he mows grass without difficulty.  Current Outpatient Prescriptions on File Prior to Visit  Medication Sig Dispense Refill  . aspirin EC 81 MG tablet Take 1 tablet (81 mg total) by mouth daily.  150 tablet  2   No current facility-administered medications on file prior to visit.   No Known Allergies  Past Medical History  Diagnosis Date  . Hyperlipidemia   . Allergy   . Syncope 12/2009    Attributed to use of narcotics plus alcohol  . Cor pulmonale     By echocardiography  . Alcohol abuse   . Small bowel obstruction   . Glaucoma   . Tobacco abuse     Past Surgical History  Procedure Laterality Date  . Tooth extraction    . Pilonidal cyst excision  2005    Dr. Elpidio Anis    Family History  Problem Relation Age of Onset  . Other      FH unknown-patient raised in foster care system    History   Social History  . Marital Status: Single    Spouse Name: N/A    Number of Children: N/A  . Years of Education: N/A   Occupational History  . Not on file.   Social History Main Topics  . Smoking status: Current Every Day Smoker -- 53 years  . Smokeless tobacco: Current User    Types: Snuff, Chew     Comment: chews tobacco since age 27.  . Alcohol Use: 12.0 oz/week    20 Shots of liquor per week     Comment: 1 pint of gin daily (4cups daily)  . Drug Use: No  . Sexually Active: Not on file   Other Topics Concern  . Not on file   Social History Narrative  . No  narrative on file    ROS: Denies chest pain, pedal edema, orthopnea, PND, lightheadedness or syncope..  All other systems reviewed and are negative.  PHYSICAL EXAM: BP 150/93  Pulse 75  Ht 5\' 4"  (1.626 m)  Wt 69.741 kg (153 lb 12 oz)  BMI 26.38 kg/m2;  Body mass index is 26.38 kg/(m^2).   General-Well-developed; no acute distress Body Habitus-mildly overweight HEENT-[place]/AT; PERRL; EOM intact; conjunctiva and lids nl; multiple dental extractions Neck-No JVD; no carotid bruits Endocrine-No thyromegaly Lungs-Clear lung fields; resonant percussion; normal I-to-E ratio Cardiovascular- normal PMI; split S1 and normal S2 Abdomen-BS normal; soft and non-tender without masses or organomegaly Musculoskeletal-No deformities, cyanosis or clubbing Neurologic-Nl cranial nerves; symmetric strength and tone Skin- Warm, no significant lesions Extremities-Nl distal pulses; no edema  EKG:  Normal sinus rhythm with frequent PVCs; left atrial abnormality; diffuse T-wave inversions suggesting apical ischemia or LVH. No previous tracing for comparison.  [place] Bing, MD 01/20/2013  3:01 PM  ASSESSMENT AND PLAN

## 2013-01-20 NOTE — Patient Instructions (Addendum)
Your physician recommends that you schedule a follow-up appointment in: POST TEST  Your physician has requested that you have a stress echocardiogram. For further information please visit [email or web address] Please follow instruction sheet as given.  Your physician has recommended that you wear a holter monitor. Holter monitors are medical devices that record the heart's electrical activity. Doctors most often use these monitors to diagnose arrhythmias. Arrhythmias are problems with the speed or rhythm of the heartbeat. The monitor is a small, portable device. You can wear one while you do your normal daily activities. This is usually used to diagnose what is causing palpitations/syncope (passing out).FOR 48 HOURS A staff member from our office will alert you the with appointment date and time, once available  Your physician recommends that you return for lab work in: TODAY (SLIPS GIVEN-CMET,MAG)

## 2013-01-21 ENCOUNTER — Encounter: Payer: Self-pay | Admitting: Cardiology

## 2013-01-21 ENCOUNTER — Encounter (HOSPITAL_COMMUNITY): Payer: Self-pay | Admitting: Pharmacy Technician

## 2013-01-21 LAB — COMPREHENSIVE METABOLIC PANEL
AST: 20 U/L (ref 0–37)
Albumin: 4.1 g/dL (ref 3.5–5.2)
BUN: 9 mg/dL (ref 6–23)
CO2: 25 mEq/L (ref 19–32)
Calcium: 9.8 mg/dL (ref 8.4–10.5)
Chloride: 102 mEq/L (ref 96–112)
Creat: 0.9 mg/dL (ref 0.50–1.35)
Glucose, Bld: 100 mg/dL — ABNORMAL HIGH (ref 70–99)

## 2013-01-21 LAB — MAGNESIUM: Magnesium: 1.8 mg/dL (ref 1.5–2.5)

## 2013-01-22 ENCOUNTER — Encounter: Payer: Self-pay | Admitting: Cardiology

## 2013-01-22 ENCOUNTER — Encounter: Payer: Self-pay | Admitting: *Deleted

## 2013-01-26 NOTE — Assessment & Plan Note (Addendum)
Since symptoms are frequent, our initial study will be a 48 hour Holter monitor.  A stress echocardiogram has also been requested to assess exercise tolerance, LV systolic function and to exclude evidence for myocardial ischemia.

## 2013-01-28 ENCOUNTER — Encounter (HOSPITAL_COMMUNITY)
Admission: RE | Disposition: A | Discharge status: Home / Self Care | Payer: Self-pay | Source: Ambulatory Visit | Attending: Internal Medicine

## 2013-01-28 ENCOUNTER — Encounter (HOSPITAL_COMMUNITY): Payer: Self-pay | Admitting: *Deleted

## 2013-01-28 ENCOUNTER — Ambulatory Visit (HOSPITAL_COMMUNITY)
Admission: RE | Admit: 2013-01-28 | Discharge: 2013-01-28 | Disposition: A | Discharge status: Home / Self Care | Payer: Medicare Other | Source: Ambulatory Visit | Attending: Internal Medicine | Admitting: Internal Medicine

## 2013-01-28 DIAGNOSIS — Z1211 Encounter for screening for malignant neoplasm of colon: Secondary | ICD-10-CM

## 2013-01-28 DIAGNOSIS — K644 Residual hemorrhoidal skin tags: Secondary | ICD-10-CM | POA: Insufficient documentation

## 2013-01-28 DIAGNOSIS — D126 Benign neoplasm of colon, unspecified: Secondary | ICD-10-CM

## 2013-01-28 HISTORY — PX: COLONOSCOPY: SHX5424

## 2013-01-28 SURGERY — COLONOSCOPY
Anesthesia: Moderate Sedation

## 2013-01-28 MED ORDER — MEPERIDINE HCL 50 MG/ML IJ SOLN
INTRAMUSCULAR | Status: AC
  Filled 2013-01-28: qty 1

## 2013-01-28 MED ORDER — STERILE WATER FOR IRRIGATION IR SOLN
Status: DC | PRN
  Administered 2013-01-28: 11:00:00

## 2013-01-28 MED ORDER — SODIUM CHLORIDE 0.9 % IV SOLN
INTRAVENOUS | Status: DC
  Administered 2013-01-28: 11:00:00 via INTRAVENOUS

## 2013-01-28 MED ORDER — MEPERIDINE HCL 50 MG/ML IJ SOLN
INTRAMUSCULAR | Status: DC | PRN
  Administered 2013-01-28 (×2): 25 mg via INTRAVENOUS

## 2013-01-28 MED ORDER — MIDAZOLAM HCL 5 MG/5ML IJ SOLN
INTRAMUSCULAR | Status: DC | PRN
  Administered 2013-01-28 (×3): 2 mg via INTRAVENOUS

## 2013-01-28 MED ORDER — MIDAZOLAM HCL 5 MG/5ML IJ SOLN
INTRAMUSCULAR | Status: AC
  Filled 2013-01-28: qty 10

## 2013-01-28 NOTE — H&P (Signed)
Alexander Simmons is an 57 y.o. male.   Chief Complaint: Patient's here for colonoscopy. HPI: Patient is 57 year old African male who is here for screening colonoscopy. He denies abdominal pain change in his bowel habits or rectal bleeding. This is patient's first exam. Family history is negative for colorectal carcinoma.  Past Medical History  Diagnosis Date  . Hyperlipidemia   . Allergy   . Syncope 12/2009    Attributed to use of narcotics plus alcohol  . Cor pulmonale     By echocardiography  . Alcohol abuse   . Small bowel obstruction   . Glaucoma   . Tobacco abuse     Past Surgical History  Procedure Laterality Date  . Tooth extraction    . Pilonidal cyst excision  2005    Dr. Elpidio Anis    Family History  Problem Relation Age of Onset  . Other      FH unknown-patient raised in foster care system  . Colon cancer Neg Hx    Social History:  reports that he has been smoking.  His smokeless tobacco use includes Snuff and Chew. He reports that he drinks about 12.0 ounces of alcohol per week. He reports that he does not use illicit drugs.  Allergies: No Known Allergies  Medications Prior to Admission  Medication Sig Dispense Refill  . aspirin EC 81 MG tablet Take 1 tablet (81 mg total) by mouth daily.  150 tablet  2    No results found for this or any previous visit (from the past 48 hour(s)). No results found.  ROS  Blood pressure 150/91, temperature 97.3 F (36.3 C), temperature source Oral, resp. rate 25, height 5\' 4"  (1.626 m), weight 156 lb (70.761 kg), SpO2 96.00%. Physical Exam  Constitutional: He appears well-developed and well-nourished.  HENT:  Mouth/Throat: Oropharynx is clear and moist.  Eyes: Conjunctivae are normal. No scleral icterus.  Neck: No thyromegaly present.  Cardiovascular: Normal rate, regular rhythm and normal heart sounds.   No murmur heard. Respiratory: Effort normal and breath sounds normal.  GI: He exhibits no distension and no mass.  There is no tenderness.  Musculoskeletal: He exhibits no edema.  Lymphadenopathy:    He has no cervical adenopathy.  Neurological: He is alert.  Skin: Skin is warm and dry.     Assessment/Plan Average risk screening colonoscopy.  Alexander Simmons U 01/28/2013, 11:08 AM

## 2013-01-28 NOTE — Op Note (Signed)
COLONOSCOPY PROCEDURE REPORT  PATIENT:  Alexander Simmons  MR#:  [id number] Birthdate:  11-Jun-1956, 57 y.o., male Endoscopist:  Dr. Malissa Hippo, MD Referred By:  Dr. Syliva Overman, MD Procedure Date: 01/28/2013  Procedure:   Colonoscopy  Indications:  Patient is 57 year old African male who is undergoing average risk screening colonoscopy.  Informed Consent:  The procedure and risks were reviewed with the patient and informed consent was obtained.  Medications:  Demerol 50 mg IV Versed 6 mg IV  Description of procedure:  After a digital rectal exam was performed, that colonoscope was advanced from the anus through the rectum and colon to the area of the cecum, ileocecal valve and appendiceal orifice. The cecum was deeply intubated. These structures were well-seen and photographed for the record. From the level of the cecum and ileocecal valve, the scope was slowly and cautiously withdrawn. The mucosal surfaces were carefully surveyed utilizing scope tip to flexion to facilitate fold flattening as needed. The scope was pulled down into the rectum where a thorough exam including retroflexion was performed.  Findings:   Prep satisfactory. 3 mm polyp ablated via cold biopsy from transverse colon. Normal rectal mucosa. Small hemorrhoids below the dentate line.   Therapeutic/Diagnostic Maneuvers Performed:  See above  Complications:  None  Cecal Withdrawal Time:  15 minutes  Impression:  Examination performed to cecum. 3 mm polyp ablated via cold biopsy from transverse colon. Small external hemorrhoids.  Recommendations:  Standard instructions given. I will contact patient with biopsy results and further recommendations.  Tyeson Tanimoto U  01/28/2013 11:44 AM  CC: Dr. Syliva Overman, MD & Dr. Bonnetta Barry ref. provider found

## 2013-01-29 ENCOUNTER — Encounter (HOSPITAL_COMMUNITY): Payer: Self-pay | Admitting: Internal Medicine

## 2013-01-31 NOTE — Assessment & Plan Note (Signed)
Abnormal EKG referred to cardiology for further eval

## 2013-01-31 NOTE — Assessment & Plan Note (Signed)

## 2013-01-31 NOTE — Progress Notes (Signed)
  Subjective:    Patient ID: Alexander Simmons, male    DOB: [birth year], 57 y.o.   MRN: [id number]  HPI Pt here to re establish care. Till smokes and drinks regularly, no commitment to changing either at this time Denies recent fever or chills. Denies head or chest congestion. Denies depression or anxiety C/o occasional palpitations and intermittent chest discomfort. No aggravating or relieving factor, no radiation of pain, no associated nausea or diaphoresis   Review of Systems See HPI Denies recent fever or chills. Denies sinus pressure, nasal congestion, ear pain or sore throat. Denies chest congestion, productive cough or wheezing.  Denies abdominal pain, nausea, vomiting,diarrhea or constipation.   Denies dysuria, frequency, hesitancy or incontinence. Denies joint pain, swelling and limitation in mobility. Denies headaches, seizures, numbness, or tingling. Denies depression, anxiety or insomnia. Denies skin break down or rash.        Objective:   Physical Exam Patient alert and oriented and in no cardiopulmonary distress.Mild to moderate MR  HEENT: No facial asymmetry, EOMI, no sinus tenderness,  oropharynx pink and moist.  Neck supple no adenopathy.Poor dentition, TM clear  Chest: Clear to auscultation bilaterally.decreased air entry, no reproducible chest wall tenderness  CVS: S1, S2 no murmurs, no S3.  ABD: Soft non tender. Bowel sounds normal.  Ext: No edema  MS: Adequate ROM spine, shoulders, hips and knees.  Skin: Intact, no ulcerations or rash noted.  Psych: Good eye contact, normal affect. Memory impaired not anxious or depressed appearing.  CNS: CN 2-12 intact, power, tone and sensation normal throughout.        Assessment & Plan:

## 2013-01-31 NOTE — Assessment & Plan Note (Signed)
-   asymptomatic currently

## 2013-01-31 NOTE — Assessment & Plan Note (Signed)
Hyperlipidemia:Low fat diet discussed and encouraged.  Currently on no med, will re address at f/u

## 2013-01-31 NOTE — Assessment & Plan Note (Signed)
Importance of reducing alcohol intake discussed, pt unwilling to commit at this time

## 2013-02-02 ENCOUNTER — Ambulatory Visit (HOSPITAL_COMMUNITY): Payer: Medicare Other | Attending: Cardiology

## 2013-02-02 ENCOUNTER — Ambulatory Visit (HOSPITAL_COMMUNITY): Payer: Medicare Other

## 2013-02-09 ENCOUNTER — Ambulatory Visit: Payer: Medicare Other | Admitting: Cardiology

## 2013-02-17 ENCOUNTER — Encounter (HOSPITAL_COMMUNITY): Payer: Medicare Other

## 2013-02-18 ENCOUNTER — Encounter (HOSPITAL_COMMUNITY): Payer: Self-pay

## 2013-02-18 ENCOUNTER — Ambulatory Visit (HOSPITAL_COMMUNITY)
Admission: RE | Admit: 2013-02-18 | Discharge: 2013-02-18 | Disposition: A | Discharge status: Home / Self Care | Payer: Medicare Other | Source: Ambulatory Visit | Attending: Cardiology | Admitting: Cardiology

## 2013-02-18 DIAGNOSIS — R072 Precordial pain: Secondary | ICD-10-CM

## 2013-02-18 DIAGNOSIS — R002 Palpitations: Secondary | ICD-10-CM | POA: Insufficient documentation

## 2013-02-18 DIAGNOSIS — R079 Chest pain, unspecified: Secondary | ICD-10-CM | POA: Insufficient documentation

## 2013-02-18 NOTE — Progress Notes (Signed)
*  PRELIMINARY RESULTS* Echocardiogram 48H Holter monitor has been performed.  Alexander Simmons 02/18/2013, 11:26 AM

## 2013-02-18 NOTE — Progress Notes (Signed)
*  PRELIMINARY RESULTS* Echocardiogram Echocardiogram Stress Test has been performed.  Conrad Albion 02/18/2013, 11:12 AM

## 2013-02-18 NOTE — Progress Notes (Signed)
Stress Lab Nurses Notes - Alexander Simmons  Alexander Simmons 02/18/2013 Reason for doing test: Chest Pain and Palpitations Type of test: Stress Echo Nurse performing test: Parke Poisson, RN Nuclear Medicine Tech: Not Applicable Echo Tech: Karrie Doffing MD performing test: R. Dietrich Pates Family MD: Lodema Hong Test explained and consent signed: yes IV started: No IV started Symptoms: SOB & Fatigue Treatment/Intervention: None Reason test stopped: fatigue and SOB After recovery IV was: NA Patient to return to Nuc. Med at :NA Patient discharged: Home Patient's Condition upon discharge was: stable Comments: During test peak BP 187/86 & HR 151.  Recovery BP 137/93 & HR 92.  Symptoms resolved in recovery. Erskine Speed T

## 2013-02-21 ENCOUNTER — Encounter (HOSPITAL_COMMUNITY): Payer: Self-pay | Admitting: *Deleted

## 2013-02-21 ENCOUNTER — Emergency Department (HOSPITAL_COMMUNITY)
Admission: EM | Admit: 2013-02-21 | Discharge: 2013-02-21 | Disposition: A | Discharge status: Home / Self Care | Payer: Medicare Other | Attending: Emergency Medicine | Admitting: Emergency Medicine

## 2013-02-21 ENCOUNTER — Emergency Department (HOSPITAL_COMMUNITY): Payer: Medicare Other

## 2013-02-21 DIAGNOSIS — Z8659 Personal history of other mental and behavioral disorders: Secondary | ICD-10-CM | POA: Insufficient documentation

## 2013-02-21 DIAGNOSIS — Z8679 Personal history of other diseases of the circulatory system: Secondary | ICD-10-CM | POA: Insufficient documentation

## 2013-02-21 DIAGNOSIS — Y929 Unspecified place or not applicable: Secondary | ICD-10-CM | POA: Insufficient documentation

## 2013-02-21 DIAGNOSIS — W010XXA Fall on same level from slipping, tripping and stumbling without subsequent striking against object, initial encounter: Secondary | ICD-10-CM | POA: Insufficient documentation

## 2013-02-21 DIAGNOSIS — Z8639 Personal history of other endocrine, nutritional and metabolic disease: Secondary | ICD-10-CM | POA: Insufficient documentation

## 2013-02-21 DIAGNOSIS — Z7982 Long term (current) use of aspirin: Secondary | ICD-10-CM | POA: Insufficient documentation

## 2013-02-21 DIAGNOSIS — Z862 Personal history of diseases of the blood and blood-forming organs and certain disorders involving the immune mechanism: Secondary | ICD-10-CM | POA: Insufficient documentation

## 2013-02-21 DIAGNOSIS — Y9389 Activity, other specified: Secondary | ICD-10-CM | POA: Insufficient documentation

## 2013-02-21 DIAGNOSIS — Z79899 Other long term (current) drug therapy: Secondary | ICD-10-CM | POA: Insufficient documentation

## 2013-02-21 DIAGNOSIS — S7001XA Contusion of right hip, initial encounter: Secondary | ICD-10-CM

## 2013-02-21 DIAGNOSIS — S7000XA Contusion of unspecified hip, initial encounter: Secondary | ICD-10-CM | POA: Insufficient documentation

## 2013-02-21 DIAGNOSIS — Z8669 Personal history of other diseases of the nervous system and sense organs: Secondary | ICD-10-CM | POA: Insufficient documentation

## 2013-02-21 DIAGNOSIS — F17200 Nicotine dependence, unspecified, uncomplicated: Secondary | ICD-10-CM | POA: Insufficient documentation

## 2013-02-21 DIAGNOSIS — Z8719 Personal history of other diseases of the digestive system: Secondary | ICD-10-CM | POA: Insufficient documentation

## 2013-02-21 MED ORDER — HYDROCODONE-ACETAMINOPHEN 5-325 MG PO TABS: ORAL_TABLET | ORAL | Status: DC

## 2013-02-21 NOTE — ED Notes (Signed)
Pt was weed eating slipped causing him to fall on ground on right hip and right arm area. Pt has abrasion noted to right elbow, c/o pain to right hip area that is worse with walking.

## 2013-02-21 NOTE — ED Notes (Signed)
Pt states fell while using a weed eater on Thursday, injuring his rt hip. Pain increases with weight bearing. Pt also has a moderate size abrasion to his rt elbow. Bleeding controlled, no s/s of infection at this time. NAD noted.

## 2013-02-22 NOTE — ED Provider Notes (Signed)
History    CSN: [id number] Arrival date & time 02/21/13  1156  First MD Initiated Contact with Patient 02/21/13 1239     Chief Complaint  Patient presents with  . Hip Pain   (Consider location/radiation/quality/duration/timing/severity/associated sxs/prior Treatment) HPI Comments: Alexander Simmons is a 57 y.o. male who presents to the Emergency Department complaining of right hip pain after a fall onto dirt.  states the pain to the hip is worse with weight bearing and improves with rest.  He denies swelling, abrasion, numbness, back pain, head injury or neck pain.  He also denies pain into his groin or scrotum  Patient is a 57 y.o. male presenting with hip pain. The history is provided by the patient.  Hip Pain This is a new problem. The current episode started today. The problem occurs constantly. The problem has been unchanged. Associated symptoms include arthralgias. Pertinent negatives include no abdominal pain, chest pain, chills, congestion, fever, headaches, joint swelling, myalgias, nausea, neck pain, numbness, urinary symptoms, vertigo, visual change, vomiting or weakness. The symptoms are aggravated by standing, twisting, bending and walking. He has tried nothing for the symptoms. The treatment provided no relief.   Past Medical History  Diagnosis Date  . Hyperlipidemia   . Allergy   . Syncope 12/2009    Attributed to use of narcotics plus alcohol  . Cor pulmonale     By echocardiography  . Alcohol abuse   . Small bowel obstruction   . Glaucoma   . Tobacco abuse    Past Surgical History  Procedure Laterality Date  . Tooth extraction    . Pilonidal cyst excision  2005    Dr. Elpidio Anis  . Colonoscopy N/A 01/28/2013    Procedure: COLONOSCOPY;  Surgeon: Malissa Hippo, MD;  Location: [facility];  Service: Endoscopy;  Laterality: N/A;  1225   Family History  Problem Relation Age of Onset  . Other      FH unknown-patient raised in foster care system  . Colon cancer  Neg Hx    History  Substance Use Topics  . Smoking status: Current Every Day Smoker -- 53 years  . Smokeless tobacco: Current User    Types: Snuff, Chew     Comment: chews tobacco since age 53.  . Alcohol Use: 12.0 oz/week    20 Shots of liquor per week     Comment: 1 pint of gin daily (4cups daily)    Review of Systems  Constitutional: Negative for fever and chills.  HENT: Negative for congestion and neck pain.   Cardiovascular: Negative for chest pain.  Gastrointestinal: Negative for nausea, vomiting and abdominal pain.  Genitourinary: Negative for dysuria and difficulty urinating.  Musculoskeletal: Positive for arthralgias. Negative for myalgias and joint swelling.  Skin: Negative for color change and wound.       Abrasion elbow  Neurological: Negative for vertigo, syncope, weakness, numbness and headaches.  All other systems reviewed and are negative.    Allergies  Review of patient's allergies indicates no known allergies.  Home Medications   Current Outpatient Rx  Name  Route  Sig  Dispense  Refill  . aspirin EC 81 MG tablet   Oral   Take 1 tablet (81 mg total) by mouth daily.   150 tablet   2   . oxymetazoline (AFRIN) 0.05 % nasal spray   Nasal   Place 2 sprays into the nose 2 (two) times daily.         Marland Kitchen  HYDROcodone-acetaminophen (NORCO/VICODIN) 5-325 MG per tablet      Take one-two tabs po q 4-6 hrs prn pain   15 tablet   0    BP 150/80  Pulse 80  Temp(Src) 98.2 F (36.8 C) (Oral)  Resp 20  SpO2 100% Physical Exam  Nursing note and vitals reviewed. Constitutional: He is oriented to person, place, and time. He appears well-developed and well-nourished. No distress.  HENT:  Head: Normocephalic and atraumatic.  Neck: Normal range of motion. Neck supple.  Cardiovascular: Normal rate, regular rhythm, normal heart sounds and intact distal pulses.   No murmur heard. Pulmonary/Chest: Effort normal and breath sounds normal. No respiratory distress. He  exhibits no tenderness.  Abdominal: Soft. He exhibits no distension. There is no tenderness. There is no rebound and no guarding.  Musculoskeletal: He exhibits tenderness. He exhibits no edema.       Lumbar back: He exhibits tenderness and pain. He exhibits normal range of motion, no swelling, no deformity, no laceration and normal pulse.  ttp of the right lateral hip.  No spinal tenderness.  DP pulses are brisk and symmetrical.  Distal sensation intact.  Hip Flexors/Extensors are intact with minimal discomfort on internal rotation of the hip.  No bruising or edema  Neurological: He is alert and oriented to person, place, and time. No cranial nerve deficit or sensory deficit. He exhibits normal muscle tone. Coordination and gait normal.  Reflex Scores:      Patellar reflexes are 2+ on the right side and 2+ on the left side.      Achilles reflexes are 2+ on the right side and 2+ on the left side. Skin: Skin is warm and dry.    ED Course  Procedures (including critical care time) Labs Reviewed - No data to display Dg Hip Complete Right  02/21/2013   *RADIOLOGY REPORT*  Clinical Data: Injury  RIGHT HIP - COMPLETE 2+ VIEW  Comparison: None.  Findings: No acute fracture and no dislocation.  IMPRESSION: No acute bony pathology.   Original Report Authenticated By: Jolaine Click, M.D.   1. Contusion, hip, right, initial encounter     MDM    Patient has ttp of the left lateral hip.  No focal neuro deficits on exam, no bruising or edema.  Ambulates with a steady gait.  Likely musculoskeletal injury although discussed possibility of occult fx and pt understands that close f/u with his PMD is needed if pain is not improving or worsens.  Pt verbalized understanding and agrees to care plan.   Elaine Middleton L. Trisha Mangle, PA-C 02/22/13 2154

## 2013-02-22 NOTE — ED Provider Notes (Signed)
Medical screening examination/treatment/procedure(s) were performed by non-physician practitioner and as supervising physician I was immediately available for consultation/collaboration.   Tiyana Galla L Cia Garretson, MD 02/22/13 2247 

## 2013-02-26 ENCOUNTER — Encounter: Payer: Self-pay | Admitting: Cardiology

## 2013-03-01 ENCOUNTER — Other Ambulatory Visit: Payer: Self-pay | Admitting: *Deleted

## 2013-03-01 MED ORDER — METOPROLOL TARTRATE 25 MG PO TABS: 25.0000 mg | ORAL_TABLET | Freq: Two times a day (BID) | ORAL | Status: DC

## 2013-03-02 ENCOUNTER — Telehealth: Payer: Self-pay | Admitting: *Deleted

## 2013-03-02 NOTE — Telephone Encounter (Signed)
PT is calling to let us know that he had a drink of alcohol on his last day of wearing monitor, plus he wants to know if it is okay to drink while being on metoprolol

## 2013-03-02 NOTE — Telephone Encounter (Signed)
Please advise 

## 2013-03-09 NOTE — Telephone Encounter (Signed)
Pt made aware of results/instructions.

## 2013-03-09 NOTE — Telephone Encounter (Signed)
No problem if he consumed alcohol while wearing the monitor.  Continuing metoprolol during monitoring is appropriate. There is no problem with small amounts of alcohol under treatment with metoprolol.

## 2013-03-11 ENCOUNTER — Ambulatory Visit (INDEPENDENT_AMBULATORY_CARE_PROVIDER_SITE_OTHER): Payer: Medicare Other | Admitting: Adult Health

## 2013-03-11 ENCOUNTER — Encounter: Payer: Self-pay | Admitting: Adult Health

## 2013-03-11 VITALS — BP 118/82 | HR 86 | Ht 64.0 in | Wt 153.0 lb

## 2013-03-11 DIAGNOSIS — I426 Alcoholic cardiomyopathy: Secondary | ICD-10-CM

## 2013-03-11 DIAGNOSIS — R002 Palpitations: Secondary | ICD-10-CM

## 2013-03-11 MED ORDER — LISINOPRIL 2.5 MG PO TABS: 2.5000 mg | ORAL_TABLET | Freq: Every day | ORAL | Status: DC

## 2013-03-11 MED ORDER — METOPROLOL TARTRATE 25 MG PO TABS: 25.0000 mg | ORAL_TABLET | Freq: Two times a day (BID) | ORAL | Status: DC

## 2013-03-11 NOTE — Progress Notes (Signed)
Name: Alexander Simmons    DOB: [birth year]  Age: 57 y.o.  MR#: [id number]       PCP:  Syliva Overman, MD      Insurance: Payor: MEDICARE / Plan: MEDICARE PART A AND B / Product Type: *No Product type* /   CC:    Chief Complaint  Patient presents with  . Palpitations    VS Filed Vitals:   03/11/13 1405  BP: 118/82  Pulse: 86  Height: 5\' 4"  (1.626 m)  Weight: 153 lb (69.4 kg)  SpO2: 98%    Weights Current Weight  03/11/13 153 lb (69.4 kg)  01/28/13 156 lb (70.761 kg)  01/28/13 156 lb (70.761 kg)    Blood Pressure  BP Readings from Last 3 Encounters:  03/11/13 118/82  02/21/13 150/80  01/28/13 125/82     Admit date:  (Not on file) Last encounter with RMR:  Visit date not found   Allergy Review of patient's allergies indicates no known allergies.  Current Outpatient Prescriptions  Medication Sig Dispense Refill  . aspirin EC 81 MG tablet Take 1 tablet (81 mg total) by mouth daily.  150 tablet  2  . HYDROcodone-acetaminophen (NORCO/VICODIN) 5-325 MG per tablet Take one-two tabs po q 4-6 hrs prn pain  15 tablet  0  . metoprolol tartrate (LOPRESSOR) 25 MG tablet Take 1 tablet (25 mg total) by mouth 2 (two) times daily.  60 tablet  6  . oxymetazoline (AFRIN) 0.05 % nasal spray Place 2 sprays into the nose 2 (two) times daily.       No current facility-administered medications for this visit.    Discontinued Meds:   There are no discontinued medications.  Patient Active Problem List   Diagnosis Date Noted  . History of diagnostic tests 01/20/2013  . Hyperlipidemia   . Tobacco abuse   . Palpitations 01/05/2013  . BACK PAIN, LUMBAR 06/05/2010  . ALCOHOLISM 02/21/2010  . MENTAL RETARDATION, MILD 01/12/2008  . ALLERGIC RHINITIS, SEASONAL 01/12/2008    LABS    Component Value Date/Time   NA 137 01/20/2013 1507   NA 138 02/01/2010 1845   NA 139 01/10/2010 0420   K 3.8 01/20/2013 1507   K 3.8 02/01/2010 1845   K 4.6 DELTA CHECK NOTED 01/10/2010 0420   CL 102 01/20/2013 1507    CL 104 02/01/2010 1845   CL 108 01/10/2010 0420   CO2 25 01/20/2013 1507   CO2 19 02/01/2010 1845   CO2 27 01/10/2010 0420   GLUCOSE 100* 01/20/2013 1507   GLUCOSE 95 02/01/2010 1845   GLUCOSE 113* 01/10/2010 0420   BUN 9 01/20/2013 1507   BUN 13 02/01/2010 1845   BUN 7 01/10/2010 0420   CREATININE 0.90 01/20/2013 1507   CREATININE 1.01 02/01/2010 1845   CREATININE 1.06 01/10/2010 0420   CREATININE 1.07 01/09/2010 0558   CALCIUM 9.8 01/20/2013 1507   CALCIUM 9.2 02/01/2010 1845   CALCIUM 9.1 01/10/2010 0420   GFRNONAA >60 01/10/2010 0420   GFRNONAA >60 01/09/2010 0558   GFRNONAA 50* 01/08/2010 1934   GFRAA  Value: >60        The eGFR has been calculated using the MDRD equation. This calculation has not been validated in all clinical situations. eGFR's persistently <60 mL/min signify possible Chronic Kidney Disease. 01/10/2010 0420   GFRAA  Value: >60        The eGFR has been calculated using the MDRD equation. This calculation has not been validated in all clinical situations. eGFR's  persistently <60 mL/min signify possible Chronic Kidney Disease. 01/09/2010 0558   GFRAA  Value: >60        The eGFR has been calculated using the MDRD equation. This calculation has not been validated in all clinical situations. eGFR's persistently <60 mL/min signify possible Chronic Kidney Disease. 01/08/2010 1934   CMP     Component Value Date/Time   NA 137 01/20/2013 1507   K 3.8 01/20/2013 1507   CL 102 01/20/2013 1507   CO2 25 01/20/2013 1507   GLUCOSE 100* 01/20/2013 1507   BUN 9 01/20/2013 1507   CREATININE 0.90 01/20/2013 1507   CREATININE 1.01 02/01/2010 1845   CALCIUM 9.8 01/20/2013 1507   PROT 7.1 01/20/2013 1507   ALBUMIN 4.1 01/20/2013 1507   AST 20 01/20/2013 1507   ALT 11 01/20/2013 1507   ALKPHOS 72 01/20/2013 1507   BILITOT 0.5 01/20/2013 1507   GFRNONAA >60 01/10/2010 0420   GFRAA  Value: >60        The eGFR has been calculated using the MDRD equation. This calculation has not been validated in all clinical  situations. eGFR's persistently <60 mL/min signify possible Chronic Kidney Disease. 01/10/2010 0420       Component Value Date/Time   WBC 3.5* 01/08/2013 1045   WBC 6.6 01/09/2010 0558   WBC 8.9 01/08/2010 1934   HGB 14.2 01/08/2013 1045   HGB 11.6* 01/09/2010 0558   HGB 13.4 01/08/2010 1934   HCT 41.9 01/08/2013 1045   HCT 32.4* 01/09/2010 0558   HCT 37.8* 01/08/2010 1934   MCV 95.7 01/08/2013 1045   MCV 98.1 01/09/2010 0558   MCV 99.7 01/08/2010 1934    Lipid Panel     Component Value Date/Time   CHOL 208* 01/08/2013 1045   TRIG 394* 01/08/2013 1045   HDL 43 01/08/2013 1045   CHOLHDL 4.8 01/08/2013 1045   VLDL 79* 01/08/2013 1045   LDLCALC 86 01/08/2013 1045    ABG No results found for this basename: phart, pco2, pco2art, po2, po2art, hco3, tco2, acidbasedef, o2sat     Lab Results  Component Value Date   TSH 1.454 01/08/2013   BNP (last 3 results) No results found for this basename: PROBNP,  in the last 8760 hours Cardiac Panel (last 3 results) No results found for this basename: CKTOTAL, CKMB, TROPONINI, RELINDX,  in the last 72 hours  Iron/TIBC/Ferritin No results found for this basename: iron, tibc, ferritin     EKG Orders placed during the hospital encounter of 02/18/13  . HOLTER MONITOR - 48 HOUR  . HOLTER MONITOR - 48 HOUR     Prior Assessment and Plan Problem List as of 03/11/2013     Respiratory   ALLERGIC RHINITIS, SEASONAL   Last Assessment & Plan   01/05/2013 Office Visit Written 01/31/2013 11:58 PM by Kerri Perches, MD     asymptomatic currently      Other   ALCOHOLISM   Last Assessment & Plan   01/05/2013 Office Visit Written 01/31/2013 11:57 PM by Kerri Perches, MD     Importance of reducing alcohol intake discussed, pt unwilling to commit at this time    MENTAL RETARDATION, MILD   BACK PAIN, LUMBAR   Palpitations   Last Assessment & Plan   01/05/2013 Office Visit Written 01/31/2013 11:58 PM by Kerri Perches, MD     Abnormal EKG referred to  cardiology for further eval    Hyperlipidemia   Last Assessment & Plan   01/05/2013 Office  Visit Written 01/31/2013 11:59 PM by Kerri Perches, MD     Hyperlipidemia:Low fat diet discussed and encouraged.  Currently on no med, will re address at f/u    Tobacco abuse   Last Assessment & Plan   01/05/2013 Office Visit Written 01/31/2013 11:57 PM by Kerri Perches, MD     Patient counseled for approximately 5 minutes regarding the health risks of ongoing nicotine use, specifically all types of cancer, heart disease, stroke and respiratory failure. The options available for help with cessation ,the behavioral changes to assist the process, and the option to either gradully reduce usage  Or abruptly stop.is also discussed. Pt is also encouraged to set specific goals in number of cigarettes used daily, as well as to set a quit date.     History of diagnostic tests       Imaging: Dg Hip Complete Right  02/21/2013   *RADIOLOGY REPORT*  Clinical Data: Injury  RIGHT HIP - COMPLETE 2+ VIEW  Comparison: None.  Findings: No acute fracture and no dislocation.  IMPRESSION: No acute bony pathology.   Original Report Authenticated By: Jolaine Click, M.D.

## 2013-03-11 NOTE — Patient Instructions (Addendum)
Your physician recommends that you schedule a follow-up appointment in: 1 month  Your physician has requested that you have an echocardiogram. Echocardiography is a painless test that uses sound waves to create images of your heart. It provides your doctor with information about the size and shape of your heart and how well your heart's chambers and valves are working. This procedure takes approximately one hour. There are no restrictions for this procedure.   Your physician has recommended you make the following change in your medication:  1. Lopressor 25 mg Twice a day 2.Lisinopril 2.5 mg daily.

## 2013-03-11 NOTE — Progress Notes (Signed)
   HPI: [name] is a 57 year old patient of Dr. Dietrich Pates we are following for ongoing assessment and management of palpitations. He was originally seen by Dr. Dietrich Pates on 01/26/2013. The patient was scheduled for 48 hour Holter monitor, and a stress echocardiogram. He is here for followup to discuss test results.    Echocardiogram revealed no evidence of stress-induced ischemia, LVEF 65-70% with no wall motion abnormalities. Holter monitor room we will predominantly rhythm is sinus rhythm, occasional PVCs less than 1% of the time with PACs. He did have an episode of sustained SVT with a heart rate greater than 139 and 60 beats per minute. 1 and then on 626 at 4:50 PM looked like possible atrial flutter. Other documented SVT that could be atypical atrial flutter or ectopy. There is no definite atrial fibrillation. This was read by Dr. Simona Huh on 02/23/2013.  No Known Allergies  Current Outpatient Prescriptions  Medication Sig Dispense Refill  . aspirin EC 81 MG tablet Take 1 tablet (81 mg total) by mouth daily.  150 tablet  2  . HYDROcodone-acetaminophen (NORCO/VICODIN) 5-325 MG per tablet Take one-two tabs po q 4-6 hrs prn pain  15 tablet  0  . metoprolol tartrate (LOPRESSOR) 25 MG tablet Take 1 tablet (25 mg total) by mouth 2 (two) times daily.  60 tablet  6  . oxymetazoline (AFRIN) 0.05 % nasal spray Place 2 sprays into the nose 2 (two) times daily.       No current facility-administered medications for this visit.    Past Medical History  Diagnosis Date  . Hyperlipidemia   . Allergy   . Syncope 12/2009    Attributed to use of narcotics plus alcohol  . Cor pulmonale     By echocardiography  . Alcohol abuse   . Small bowel obstruction   . Glaucoma   . Tobacco abuse     Past Surgical History  Procedure Laterality Date  . Tooth extraction    . Pilonidal cyst excision  2005    Dr. Elpidio Anis  . Colonoscopy N/A 01/28/2013    Procedure: COLONOSCOPY;  Surgeon: Malissa Hippo,  MD;  Location: [facility];  Service: Endoscopy;  Laterality: N/A;  1225    ROS: PHYSICAL EXAM There were no vitals taken for this visit.  EKG:  ASSESSMENT AND PLAN

## 2013-03-12 ENCOUNTER — Telehealth: Payer: Self-pay | Admitting: *Deleted

## 2013-03-12 NOTE — Telephone Encounter (Signed)
Pt is calling to see what is wrong with his heart. Pt was just seen 03/11/13. He also wants to know about them medicine changes that were done yesterday.

## 2013-03-12 NOTE — Telephone Encounter (Signed)
Called pt back and he said he had no questions.

## 2013-03-22 ENCOUNTER — Ambulatory Visit (HOSPITAL_COMMUNITY)
Admission: RE | Admit: 2013-03-22 | Discharge: 2013-03-22 | Disposition: A | Discharge status: Home / Self Care | Payer: Medicare Other | Source: Ambulatory Visit | Attending: Adult Health | Admitting: Adult Health

## 2013-03-22 DIAGNOSIS — F1010 Alcohol abuse, uncomplicated: Secondary | ICD-10-CM | POA: Insufficient documentation

## 2013-03-22 DIAGNOSIS — I369 Nonrheumatic tricuspid valve disorder, unspecified: Secondary | ICD-10-CM

## 2013-03-22 DIAGNOSIS — R079 Chest pain, unspecified: Secondary | ICD-10-CM | POA: Insufficient documentation

## 2013-03-22 DIAGNOSIS — I426 Alcoholic cardiomyopathy: Secondary | ICD-10-CM

## 2013-03-22 DIAGNOSIS — R002 Palpitations: Secondary | ICD-10-CM | POA: Insufficient documentation

## 2013-03-22 DIAGNOSIS — F17200 Nicotine dependence, unspecified, uncomplicated: Secondary | ICD-10-CM | POA: Insufficient documentation

## 2013-03-22 NOTE — Progress Notes (Signed)
*  PRELIMINARY RESULTS* Echocardiogram 2D Echocardiogram has been performed.  Alexander Simmons 03/22/2013, 2:21 PM 

## 2013-04-13 ENCOUNTER — Ambulatory Visit (INDEPENDENT_AMBULATORY_CARE_PROVIDER_SITE_OTHER): Payer: Medicare Other | Admitting: Adult Health

## 2013-04-13 ENCOUNTER — Encounter: Payer: Self-pay | Admitting: Adult Health

## 2013-04-13 VITALS — BP 122/76 | HR 79 | Ht 64.0 in | Wt 146.4 lb

## 2013-04-13 DIAGNOSIS — I426 Alcoholic cardiomyopathy: Secondary | ICD-10-CM

## 2013-04-13 DIAGNOSIS — F1020 Alcohol dependence, uncomplicated: Secondary | ICD-10-CM

## 2013-04-13 DIAGNOSIS — R002 Palpitations: Secondary | ICD-10-CM

## 2013-04-13 MED ORDER — LISINOPRIL 2.5 MG PO TABS: 2.5000 mg | ORAL_TABLET | Freq: Every day | ORAL | Status: DC

## 2013-04-13 MED ORDER — METOPROLOL TARTRATE 25 MG PO TABS: 25.0000 mg | ORAL_TABLET | Freq: Two times a day (BID) | ORAL | Status: DC

## 2013-04-13 NOTE — Assessment & Plan Note (Signed)
They have improved with use of metoprolol. He is advised to take the medication daily as directed at the same time of day. He verbalizes understanding. Refills are provided.

## 2013-04-13 NOTE — Progress Notes (Signed)
   HPI: Alexander Simmons is a 57 year old patient of Dr. Dietrich Pates we are following for ongoing assessment and management of palpitations. Has a history of alcoholic cardiomyopathy. Most recent echocardiogram in June, demonstrated LVEF of 65-70% with no wall motion abnormalities. He had complaints of palpitations, with a Holter monitor completed in June demonstrating commonly sinus rhythm. He did have one episode of sustained SVT with heart rate greater than 139 beats per minute. This looked like possible atrial flutter. There is no definite atrial fibrillation.   He is medically compliant, but doesn't normally take his medication at the same time everyday. He has had some complaints of palpitations, but nothing sustained. He unfortunately continues to drink, but states he has cut "way down."  No Known Allergies  Current Outpatient Prescriptions  Medication Sig Dispense Refill  . aspirin EC 81 MG tablet Take 1 tablet (81 mg total) by mouth daily.  150 tablet  2  . HYDROcodone-acetaminophen (NORCO/VICODIN) 5-325 MG per tablet Take one-two tabs po q 4-6 hrs prn pain  15 tablet  0  . lisinopril (PRINIVIL,ZESTRIL) 2.5 MG tablet Take 1 tablet (2.5 mg total) by mouth daily.  90 tablet  3  . metoprolol tartrate (LOPRESSOR) 25 MG tablet Take 1 tablet (25 mg total) by mouth 2 (two) times daily.  60 tablet  6  . oxymetazoline (AFRIN) 0.05 % nasal spray Place 2 sprays into the nose 2 (two) times daily.       No current facility-administered medications for this visit.    Past Medical History  Diagnosis Date  . Hyperlipidemia   . Allergy   . Syncope 12/2009    Attributed to use of narcotics plus alcohol  . Cor pulmonale     By echocardiography  . Alcohol abuse   . Small bowel obstruction   . Glaucoma   . Tobacco abuse     Past Surgical History  Procedure Laterality Date  . Tooth extraction    . Pilonidal cyst excision  2005    Dr. Elpidio Anis  . Colonoscopy N/A 01/28/2013    Procedure: COLONOSCOPY;   Surgeon: Malissa Hippo, MD;  Location: [facility];  Service: Endoscopy;  Laterality: N/A;  1225    ZOX:WRUEAV of systems complete and found to be negative unless listed above  PHYSICAL EXAM BP 122/76  Pulse 79  Ht 5\' 4"  (1.626 m)  Wt 146 lb 6.4 oz (66.407 kg)  BMI 25.12 kg/m2  General: Well developed, well nourished, in no acute distress, poor dentation Head: Eyes PERRLA, No xanthomas.   Normal cephalic and atramatic  Lungs: Clear bilaterally to auscultation, no wheezes or rhonchi.  Heart: HRRR S1 S2, without MRG.  Pulses are 2+ & equal.            No carotid bruit. No JVD.  No abdominal bruits. Abdomen: Bowel sounds are positive, abdomen soft and non-tender without masses or                  Hernia's noted. Msk:  Back normal, normal gait. Normal strength and tone for age. Extremities: No clubbing, cyanosis or edema.  DP +1 Neuro: Alert and oriented X 3. Psych:  Good affect, responds appropriately    ASSESSMENT AND PLAN

## 2013-04-13 NOTE — Assessment & Plan Note (Signed)
He continues to drink. I have discussed the risks of continuing to do so. He verbalizes understanding.  I doubt he will stop on his own, but is not willing to go to AA or to rehab at this time.

## 2013-04-13 NOTE — Patient Instructions (Addendum)
Your physician recommends that you schedule a follow-up appointment in: 6 months You will receive a reminder letter two months in advance reminding you to call and schedule your appointment. If you don't receive this letter, please contact our office.  Your physician recommends that you continue on your current medications as directed. Please refer to the Current Medication list given to you today.     

## 2013-04-13 NOTE — Progress Notes (Deleted)
Name: Alexander Simmons    DOB: 1956-02-04  Age: 57 y.o.  MR#: [id number]       PCP:  Syliva Overman, MD      Insurance: Payor: MEDICARE / Plan: MEDICARE PART A AND B / Product Type: *No Product type* /   CC:    Chief Complaint  Patient presents with  . Palpitations  . Cardiomyopathy    ETOH    VS Filed Vitals:   04/13/13 1400  BP: 122/76  Pulse: 79  Height: 5\' 4"  (1.626 m)  Weight: 146 lb 6.4 oz (66.407 kg)    Weights Current Weight  04/13/13 146 lb 6.4 oz (66.407 kg)  03/11/13 153 lb (69.4 kg)  01/28/13 156 lb (70.761 kg)    Blood Pressure  BP Readings from Last 3 Encounters:  04/13/13 122/76  03/11/13 118/82  02/21/13 150/80     Admit date:  (Not on file) Last encounter with RMR:  03/11/2013   Allergy Review of patient's allergies indicates no known allergies.  Current Outpatient Prescriptions  Medication Sig Dispense Refill  . aspirin EC 81 MG tablet Take 1 tablet (81 mg total) by mouth daily.  150 tablet  2  . HYDROcodone-acetaminophen (NORCO/VICODIN) 5-325 MG per tablet Take one-two tabs po q 4-6 hrs prn pain  15 tablet  0  . lisinopril (PRINIVIL,ZESTRIL) 2.5 MG tablet Take 1 tablet (2.5 mg total) by mouth daily.  90 tablet  3  . metoprolol tartrate (LOPRESSOR) 25 MG tablet Take 1 tablet (25 mg total) by mouth 2 (two) times daily.  60 tablet  6  . oxymetazoline (AFRIN) 0.05 % nasal spray Place 2 sprays into the nose 2 (two) times daily.       No current facility-administered medications for this visit.    Discontinued Meds:   There are no discontinued medications.  Patient Active Problem List   Diagnosis Date Noted  . History of diagnostic tests 01/20/2013  . Hyperlipidemia   . Tobacco abuse   . Palpitations 01/05/2013  . BACK PAIN, LUMBAR 06/05/2010  . ALCOHOLISM 02/21/2010  . MENTAL RETARDATION, MILD 01/12/2008  . ALLERGIC RHINITIS, SEASONAL 01/12/2008    LABS    Component Value Date/Time   NA 137 01/20/2013 1507   NA 138 02/01/2010 1845   NA 139  01/10/2010 0420   K 3.8 01/20/2013 1507   K 3.8 02/01/2010 1845   K 4.6 DELTA CHECK NOTED 01/10/2010 0420   CL 102 01/20/2013 1507   CL 104 02/01/2010 1845   CL 108 01/10/2010 0420   CO2 25 01/20/2013 1507   CO2 19 02/01/2010 1845   CO2 27 01/10/2010 0420   GLUCOSE 100* 01/20/2013 1507   GLUCOSE 95 02/01/2010 1845   GLUCOSE 113* 01/10/2010 0420   BUN 9 01/20/2013 1507   BUN 13 02/01/2010 1845   BUN 7 01/10/2010 0420   CREATININE 0.90 01/20/2013 1507   CREATININE 1.01 02/01/2010 1845   CREATININE 1.06 01/10/2010 0420   CREATININE 1.07 01/09/2010 0558   CALCIUM 9.8 01/20/2013 1507   CALCIUM 9.2 02/01/2010 1845   CALCIUM 9.1 01/10/2010 0420   GFRNONAA >60 01/10/2010 0420   GFRNONAA >60 01/09/2010 0558   GFRNONAA 50* 01/08/2010 1934   GFRAA  Value: >60        The eGFR has been calculated using the MDRD equation. This calculation has not been validated in all clinical situations. eGFR's persistently <60 mL/min signify possible Chronic Kidney Disease. 01/10/2010 0420   GFRAA  Value: >60  The eGFR has been calculated using the MDRD equation. This calculation has not been validated in all clinical situations. eGFR's persistently <60 mL/min signify possible Chronic Kidney Disease. 01/09/2010 0558   GFRAA  Value: >60        The eGFR has been calculated using the MDRD equation. This calculation has not been validated in all clinical situations. eGFR's persistently <60 mL/min signify possible Chronic Kidney Disease. 01/08/2010 1934   CMP     Component Value Date/Time   NA 137 01/20/2013 1507   K 3.8 01/20/2013 1507   CL 102 01/20/2013 1507   CO2 25 01/20/2013 1507   GLUCOSE 100* 01/20/2013 1507   BUN 9 01/20/2013 1507   CREATININE 0.90 01/20/2013 1507   CREATININE 1.01 02/01/2010 1845   CALCIUM 9.8 01/20/2013 1507   PROT 7.1 01/20/2013 1507   ALBUMIN 4.1 01/20/2013 1507   AST 20 01/20/2013 1507   ALT 11 01/20/2013 1507   ALKPHOS 72 01/20/2013 1507   BILITOT 0.5 01/20/2013 1507   GFRNONAA >60 01/10/2010 0420   GFRAA  Value:  >60        The eGFR has been calculated using the MDRD equation. This calculation has not been validated in all clinical situations. eGFR's persistently <60 mL/min signify possible Chronic Kidney Disease. 01/10/2010 0420       Component Value Date/Time   WBC 3.5* 01/08/2013 1045   WBC 6.6 01/09/2010 0558   WBC 8.9 01/08/2010 1934   HGB 14.2 01/08/2013 1045   HGB 11.6* 01/09/2010 0558   HGB 13.4 01/08/2010 1934   HCT 41.9 01/08/2013 1045   HCT 32.4* 01/09/2010 0558   HCT 37.8* 01/08/2010 1934   MCV 95.7 01/08/2013 1045   MCV 98.1 01/09/2010 0558   MCV 99.7 01/08/2010 1934    Lipid Panel     Component Value Date/Time   CHOL 208* 01/08/2013 1045   TRIG 394* 01/08/2013 1045   HDL 43 01/08/2013 1045   CHOLHDL 4.8 01/08/2013 1045   VLDL 79* 01/08/2013 1045   LDLCALC 86 01/08/2013 1045    ABG No results found for this basename: phart, pco2, pco2art, po2, po2art, hco3, tco2, acidbasedef, o2sat     Lab Results  Component Value Date   TSH 1.454 01/08/2013   BNP (last 3 results) No results found for this basename: PROBNP,  in the last 8760 hours Cardiac Panel (last 3 results) No results found for this basename: CKTOTAL, CKMB, TROPONINI, RELINDX,  in the last 72 hours  Iron/TIBC/Ferritin No results found for this basename: iron, tibc, ferritin     EKG Orders placed during the hospital encounter of 02/18/13  . HOLTER MONITOR - 48 HOUR  . HOLTER MONITOR - 48 HOUR     Prior Assessment and Plan Problem List as of 04/13/2013     Respiratory   ALLERGIC RHINITIS, SEASONAL   Last Assessment & Plan   01/05/2013 Office Visit Written 01/31/2013 11:58 PM by Kerri Perches, MD     asymptomatic currently      Other   ALCOHOLISM   Last Assessment & Plan   01/05/2013 Office Visit Written 01/31/2013 11:57 PM by Kerri Perches, MD     Importance of reducing alcohol intake discussed, pt unwilling to commit at this time    MENTAL RETARDATION, MILD   BACK PAIN, LUMBAR   Palpitations   Last  Assessment & Plan   01/05/2013 Office Visit Written 01/31/2013 11:58 PM by Kerri Perches, MD     Abnormal EKG  referred to cardiology for further eval    Hyperlipidemia   Last Assessment & Plan   01/05/2013 Office Visit Written 01/31/2013 11:59 PM by Kerri Perches, MD     Hyperlipidemia:Low fat diet discussed and encouraged.  Currently on no med, will re address at f/u    Tobacco abuse   Last Assessment & Plan   01/05/2013 Office Visit Written 01/31/2013 11:57 PM by Kerri Perches, MD     Patient counseled for approximately 5 minutes regarding the health risks of ongoing nicotine use, specifically all types of cancer, heart disease, stroke and respiratory failure. The options available for help with cessation ,the behavioral changes to assist the process, and the option to either gradully reduce usage  Or abruptly stop.is also discussed. Pt is also encouraged to set specific goals in number of cigarettes used daily, as well as to set a quit date.     History of diagnostic tests       Imaging: No results found.

## 2013-06-15 ENCOUNTER — Encounter: Payer: Medicare Other | Admitting: Family Medicine

## 2013-10-09 IMAGING — CR DG HIP COMPLETE 2+V*R*
3 series · 3 of 3 positions shown · non-contrast
Comparison: None.

CLINICAL DATA: Injury

RIGHT HIP - COMPLETE 2+ VIEW

[view not recorded (1 of 3)]
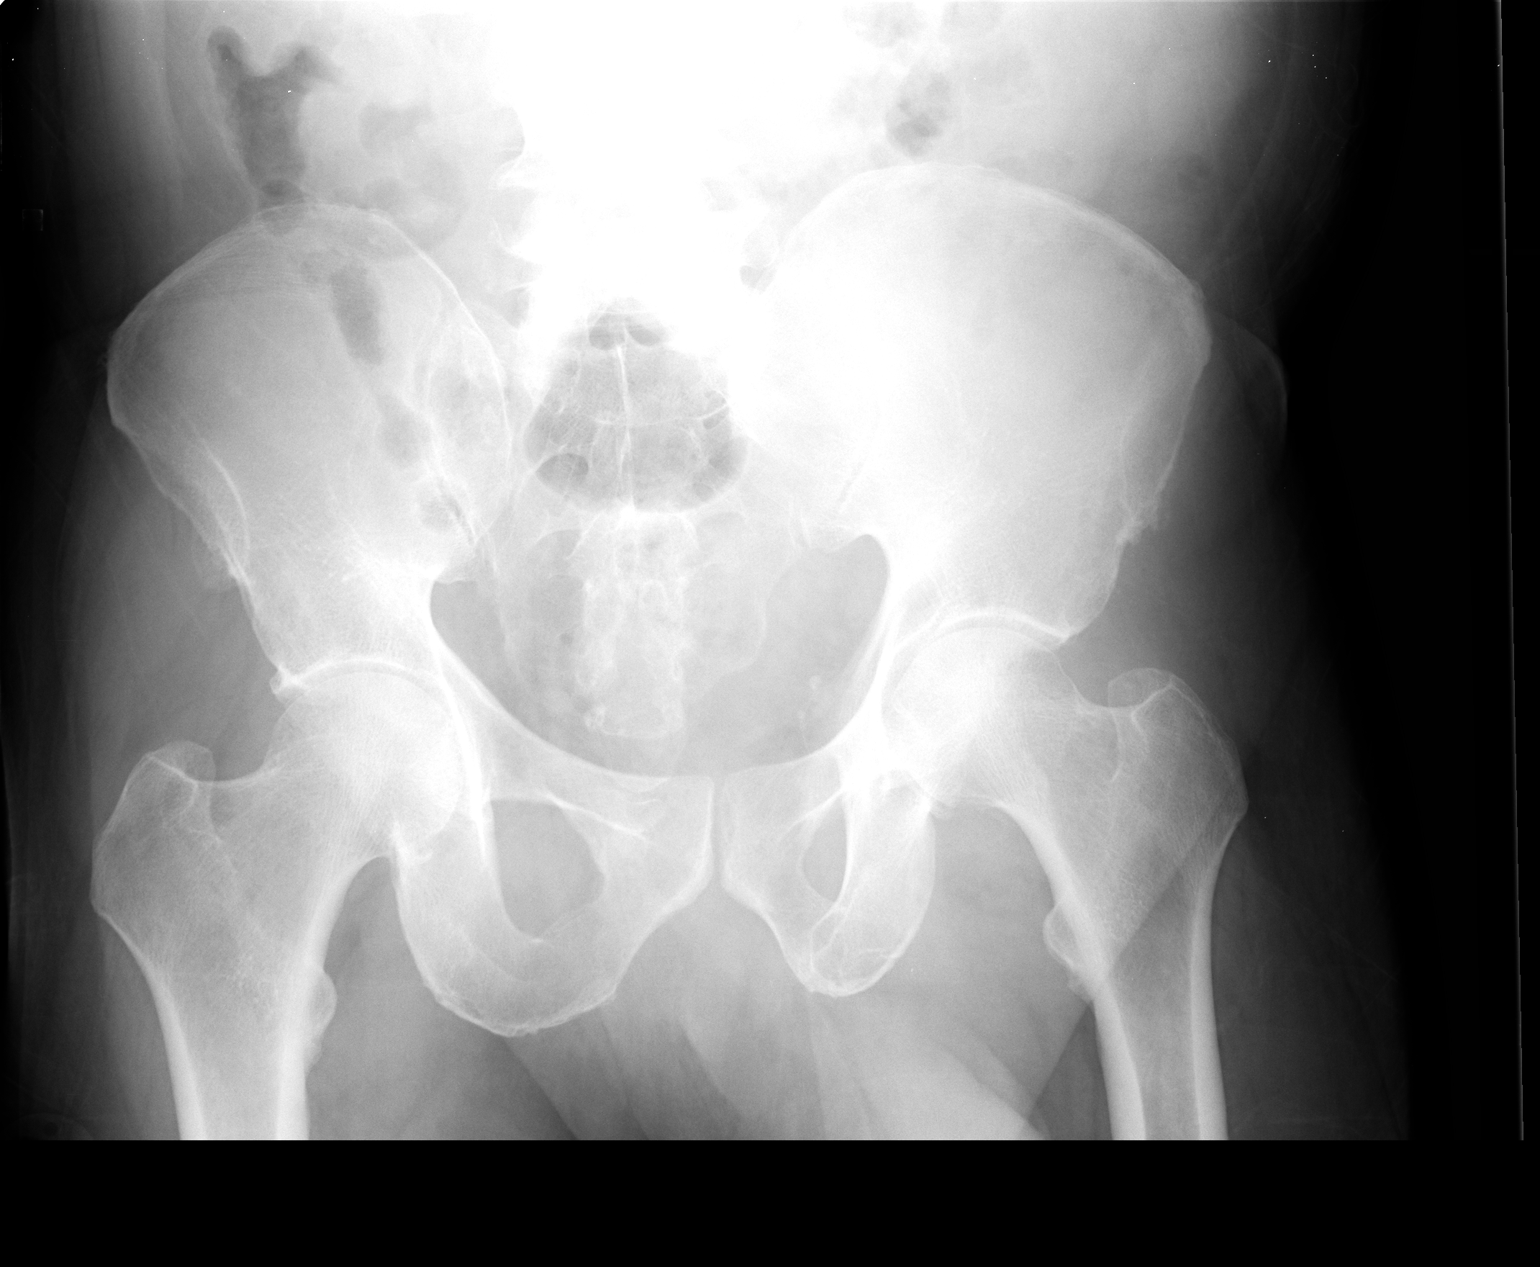

[view not recorded (2 of 3)]
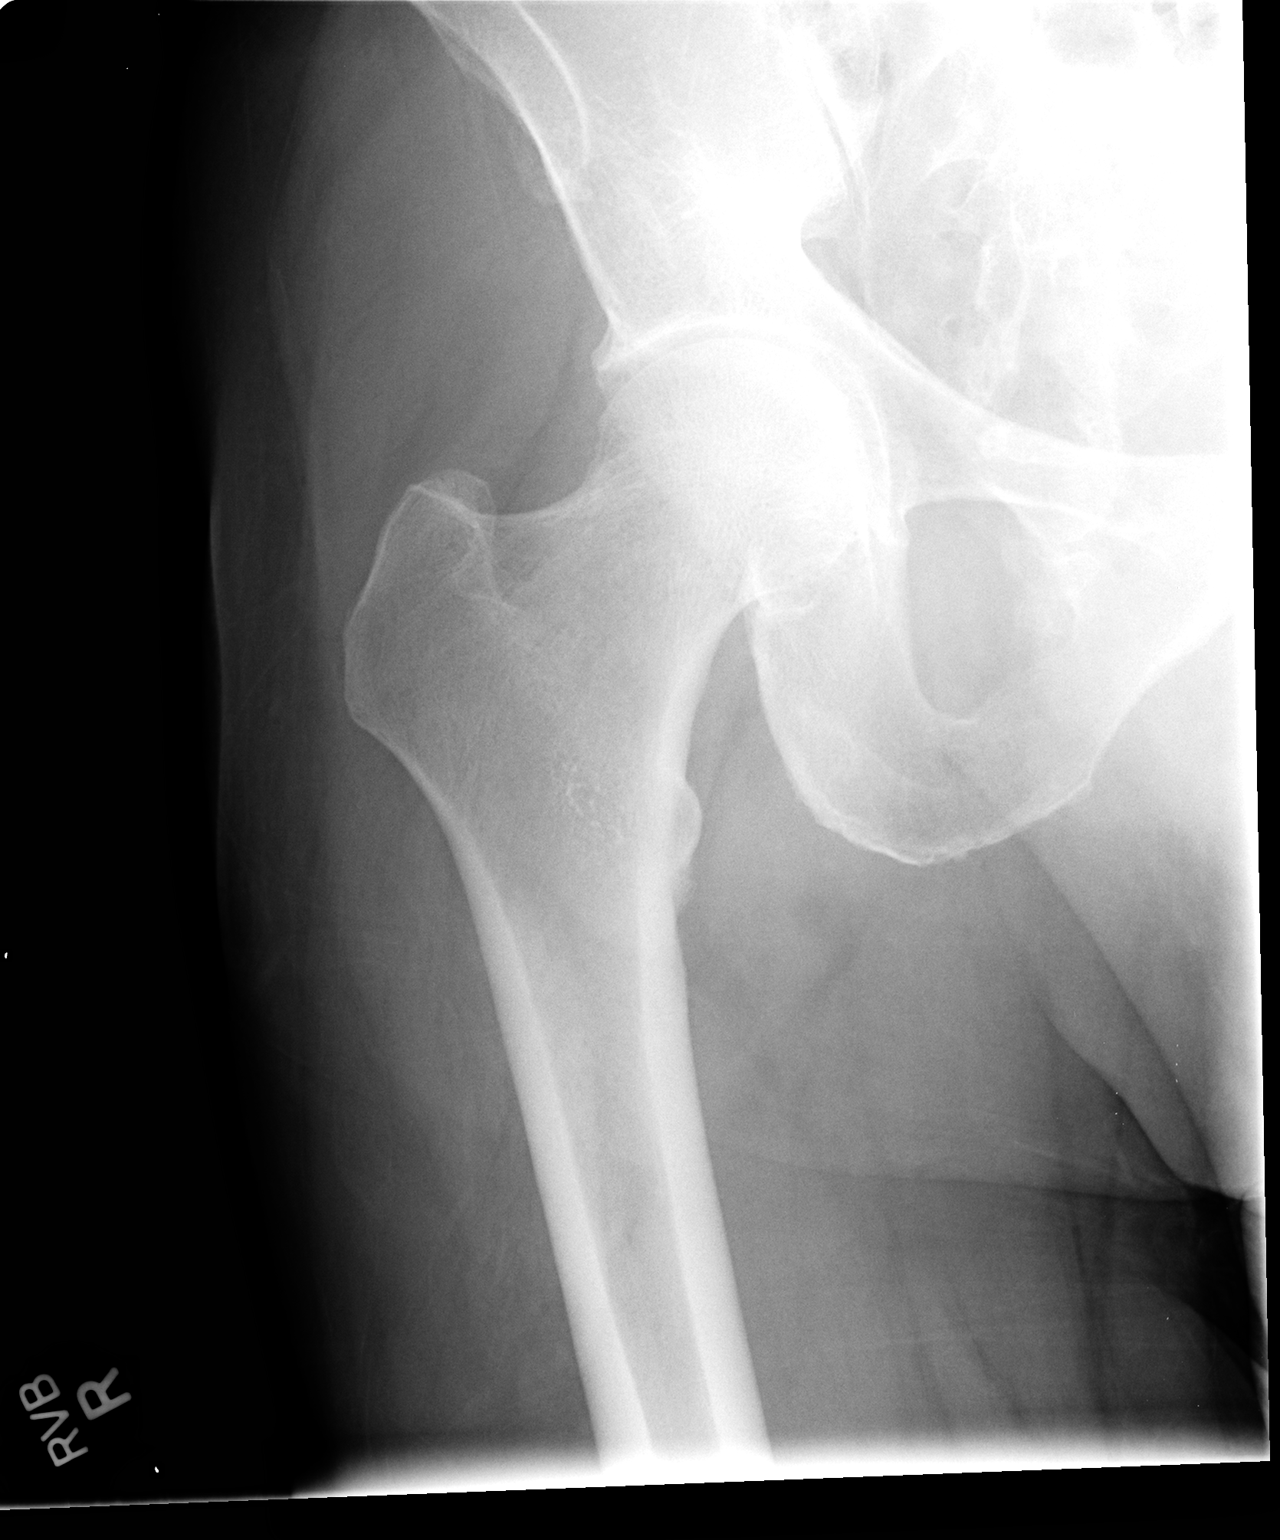

[view not recorded (3 of 3)]
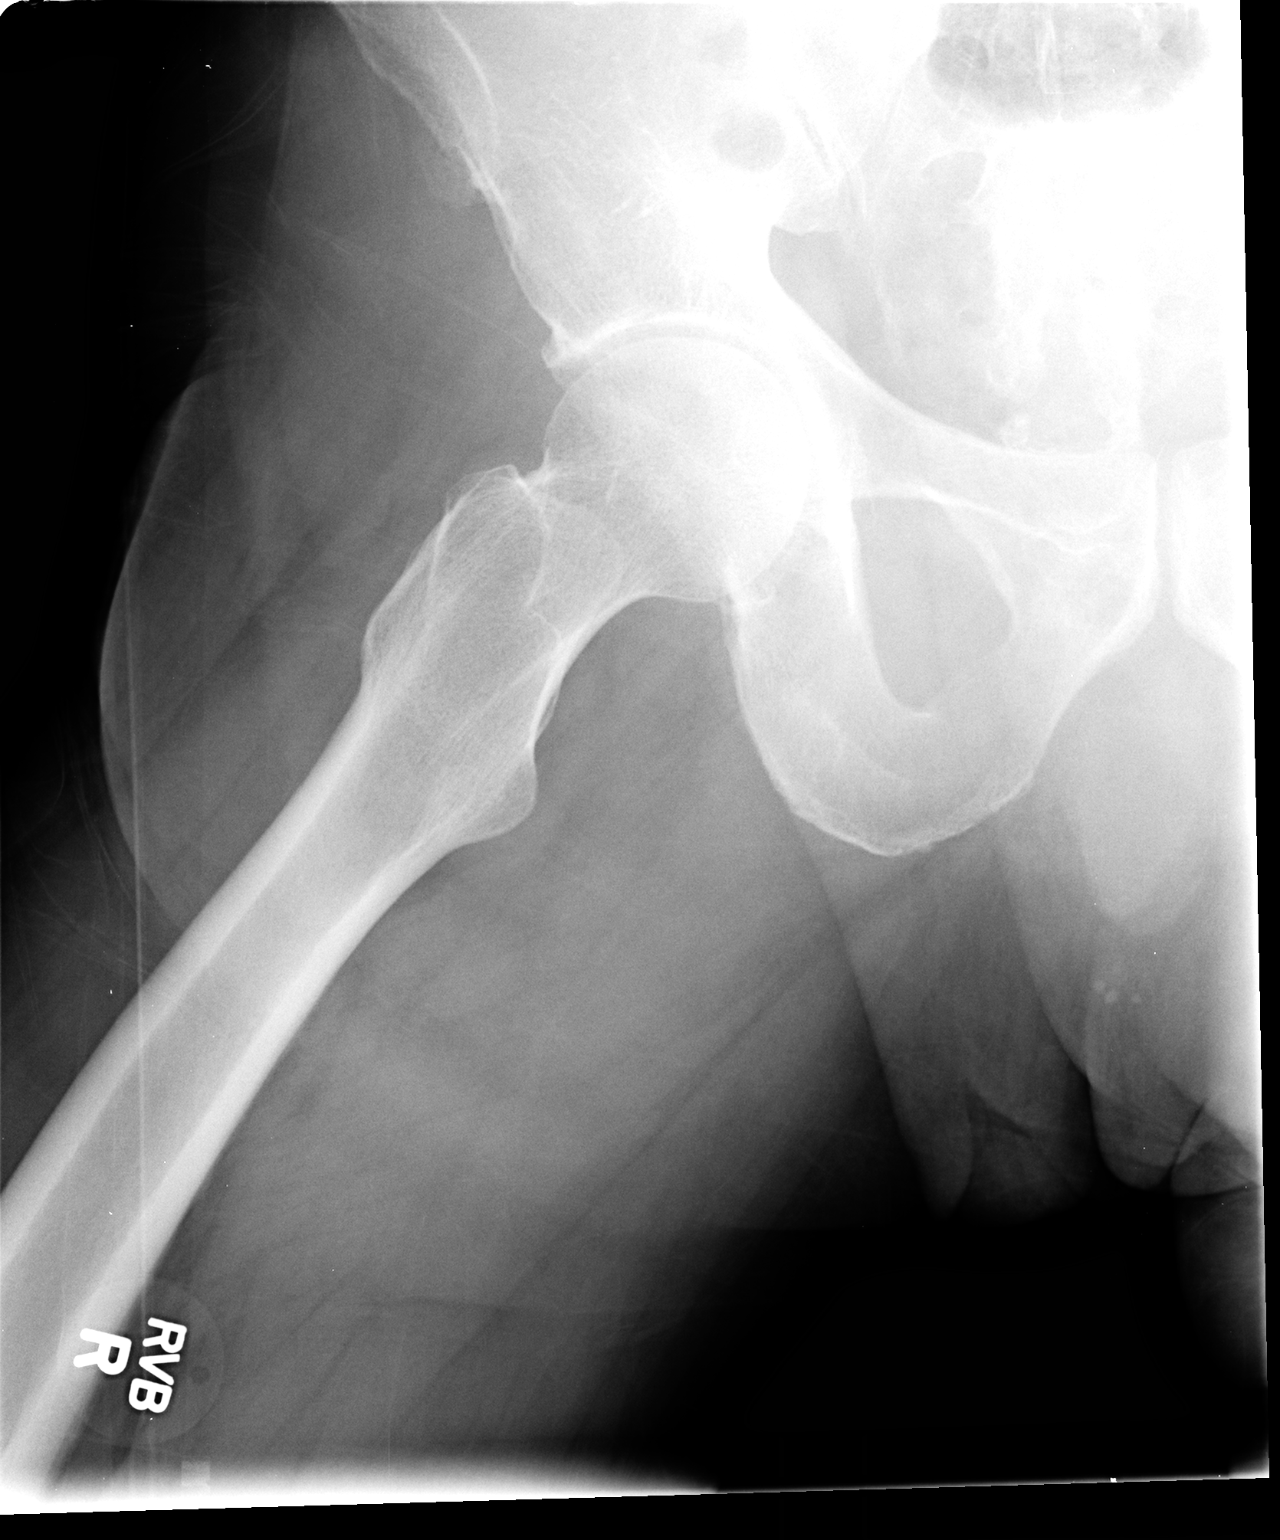

[3 of 3 positions shown; findings below may reference images not displayed]

FINDINGS: No acute fracture and no dislocation.
IMPRESSION: No acute bony pathology.

## 2014-03-11 ENCOUNTER — Telehealth: Payer: Self-pay | Admitting: Family Medicine

## 2014-03-15 NOTE — Telephone Encounter (Signed)
Patient has collected

## 2014-04-06 ENCOUNTER — Encounter: Payer: Self-pay | Admitting: Family Medicine

## 2014-04-06 ENCOUNTER — Ambulatory Visit (INDEPENDENT_AMBULATORY_CARE_PROVIDER_SITE_OTHER): Payer: PRIVATE HEALTH INSURANCE | Admitting: Family Medicine

## 2014-04-06 ENCOUNTER — Encounter (INDEPENDENT_AMBULATORY_CARE_PROVIDER_SITE_OTHER): Payer: Self-pay

## 2014-04-06 VITALS — BP 130/82 | HR 82 | Resp 18 | Ht 64.0 in | Wt 156.0 lb

## 2014-04-06 DIAGNOSIS — R002 Palpitations: Secondary | ICD-10-CM

## 2014-04-06 DIAGNOSIS — I426 Alcoholic cardiomyopathy: Secondary | ICD-10-CM

## 2014-04-06 DIAGNOSIS — Z125 Encounter for screening for malignant neoplasm of prostate: Secondary | ICD-10-CM

## 2014-04-06 DIAGNOSIS — E785 Hyperlipidemia, unspecified: Secondary | ICD-10-CM

## 2014-04-06 DIAGNOSIS — F17200 Nicotine dependence, unspecified, uncomplicated: Secondary | ICD-10-CM

## 2014-04-06 DIAGNOSIS — Z720 Tobacco use: Secondary | ICD-10-CM

## 2014-04-06 DIAGNOSIS — F70 Mild intellectual disabilities: Secondary | ICD-10-CM

## 2014-04-06 DIAGNOSIS — R1013 Epigastric pain: Secondary | ICD-10-CM | POA: Insufficient documentation

## 2014-04-06 DIAGNOSIS — I10 Essential (primary) hypertension: Secondary | ICD-10-CM

## 2014-04-06 DIAGNOSIS — F1020 Alcohol dependence, uncomplicated: Secondary | ICD-10-CM

## 2014-04-06 DIAGNOSIS — Z23 Encounter for immunization: Secondary | ICD-10-CM | POA: Insufficient documentation

## 2014-04-06 LAB — CBC
HEMATOCRIT: 40.4 % (ref 39.0–52.0)
Hemoglobin: 14 g/dL (ref 13.0–17.0)
MCH: 32.3 pg (ref 26.0–34.0)
MCHC: 34.7 g/dL (ref 30.0–36.0)
MCV: 93.3 fL (ref 78.0–100.0)
PLATELETS: 322 10*3/uL (ref 150–400)
RBC: 4.33 MIL/uL (ref 4.22–5.81)
RDW: 13.6 % (ref 11.5–15.5)
WBC: 5 10*3/uL (ref 4.0–10.5)

## 2014-04-06 NOTE — Patient Instructions (Addendum)
Annual wellness in October, call if you need me before  Your caregiver needs to come to the next visit, this is very important  Prevnar today.  Blood pressure medication is refilled  Labs today CBc, lipid, cmp, TSH , pSA  You need to stop both gin and snuff , this will improve your health

## 2014-04-07 LAB — COMPREHENSIVE METABOLIC PANEL
ALBUMIN: 4 g/dL (ref 3.5–5.2)
ALT: 13 U/L (ref 0–53)
AST: 15 U/L (ref 0–37)
Alkaline Phosphatase: 61 U/L (ref 39–117)
BUN: 13 mg/dL (ref 6–23)
CALCIUM: 8.9 mg/dL (ref 8.4–10.5)
CHLORIDE: 103 meq/L (ref 96–112)
CO2: 25 meq/L (ref 19–32)
Creat: 1.04 mg/dL (ref 0.50–1.35)
Glucose, Bld: 91 mg/dL (ref 70–99)
POTASSIUM: 4.2 meq/L (ref 3.5–5.3)
Sodium: 137 mEq/L (ref 135–145)
Total Bilirubin: 0.6 mg/dL (ref 0.2–1.2)
Total Protein: 6.6 g/dL (ref 6.0–8.3)

## 2014-04-07 LAB — LIPID PANEL
Cholesterol: 163 mg/dL (ref 0–200)
HDL: 43 mg/dL (ref >39)
LDL CALC: 73 mg/dL (ref 0–99)
Total CHOL/HDL Ratio: 3.8 Ratio
Triglycerides: 235 mg/dL — ABNORMAL HIGH (ref <150)
VLDL: 47 mg/dL — ABNORMAL HIGH (ref 0–40)

## 2014-04-07 LAB — PSA, MEDICARE: PSA: 1 ng/mL (ref <=4.00)

## 2014-04-07 LAB — TSH: TSH: 1.005 u[IU]/mL (ref 0.350–4.500)

## 2014-04-08 DIAGNOSIS — I426 Alcoholic cardiomyopathy: Secondary | ICD-10-CM | POA: Insufficient documentation

## 2014-04-08 DIAGNOSIS — I10 Essential (primary) hypertension: Secondary | ICD-10-CM | POA: Insufficient documentation

## 2014-04-08 NOTE — Assessment & Plan Note (Signed)
Administered at visit 

## 2014-04-08 NOTE — Assessment & Plan Note (Signed)
Controlled, no change in medication  

## 2014-04-08 NOTE — Assessment & Plan Note (Signed)
Controlled, no change in medication DASH diet and commitment to daily physical activity for a minimum of 30 minutes discussed and encouraged, as a part of hypertension management. The importance of attaining a healthy weight is also discussed.  

## 2014-04-08 NOTE — Assessment & Plan Note (Signed)
Still dipping snuff, states less, encouraged him to discontinue entirely to reduce risk of diseases associated with nicotine use

## 2014-04-08 NOTE — Progress Notes (Signed)
Subjective:    Patient ID: Alexander Simmons, male    DOB: Nov 08, 1955, 58 y.o.   MRN: [id number]  HPI The PT is here for follow up and re-evaluation of chronic medical conditions, medication management and review of any available recent lab and radiology data.  Preventive health is updated, specifically  Cancer screening and Immunization.   The PT denies any adverse reactions to current medications since the last visit.  Requests help for ongoing PCS services , which he does need , based on his inability to carry out some of the independent activities of living, he has mental retardation, and needs help keeping up with finances as well as medication and transport.  Note sent from caregiver mentioned bxck pain however, pt states this is not an issue, concern also reported re abdominal pain again pt does not see this as a big problem He still dips snuff and drinks gin, unwilling to quit at this time, but states doing less of each. No broken bones or accidents since last seen      Review of Systems See HPI Denies recent fever or chills. Denies sinus pressure, nasal congestion, ear pain or sore throat. Denies chest congestion, productive cough or wheezing. Denies chest pains, palpitations and leg swelling C/o intermittent mild abdominal pain depending on what he has eaten, denies nausea, vomiting,diarrhea or constipation.   Denies dysuria, frequency, hesitancy or incontinence. Denies joint pain, swelling and limitation in mobility. Denies headaches, seizures, numbness, or tingling. Denies depression, anxiety or insomnia. Denies skin break down or rash.        Objective:   Physical Exam BP 130/82  Pulse 82  Resp 18  Ht 5\' 4"  (1.626 m)  Wt 156 lb 0.6 oz (70.779 kg)  BMI 26.77 kg/m2  SpO2 100% Patient alert and oriented and in no cardiopulmonary distress.Mild MR  HEENT: No facial asymmetry, EOMI,   oropharynx pink and moist.  Neck supple no JVD, no mass.  Chest: Clear to  auscultation bilaterally.  CVS: S1, S2 no murmurs, no S3.Regular rate.  ABD: Soft milo epigastric tenderness, no organomegaly or mass, bowel sounds normal  Ext: No edema  MS: Adequate ROM spine, shoulders, hips and knees.  Skin: Intact, no ulcerations or rash noted.  Psych: Good eye contact, normal affect. Memory impaired not anxious or depressed appearing.  CNS: CN 2-12 intact, power,  normal throughout.no focal deficits noted.        Assessment & Plan:  Hypertension, essential Controlled, no change in medication DASH diet and commitment to daily physical activity for a minimum of 30 minutes discussed and encouraged, as a part of hypertension management. The importance of attaining a healthy weight is also discussed.   Palpitations Controlled, no change in medication   ALCOHOLISM Counseled re need to continue to reduce alcohol intake, states he limits gin to weekends only now, encouraged discontinuing, he already has cadiomyopathy from alcohol  Tobacco abuse Still dipping snuff, states less, encouraged him to discontinue entirely to reduce risk of diseases associated with nicotine use  Need for vaccination with 13-polyvalent pneumococcal conjugate vaccine Administered at visit  [place], MILD Pt incapable of managing his finances, was unable to do simple math calculation as far as appropriate change he should get if went to grocery store. He needs assistance with money and medication management as well as with transport. Request for [facility] services is appropriate  Alcoholic cardiomyopathy Currently denies symptoms of heart failure and is clinicaly stable. Counseled re need to d/c  alcohol and nicotine for cardiac protection

## 2014-04-08 NOTE — Assessment & Plan Note (Signed)
Counseled re need to continue to reduce alcohol intake, states he limits gin to weekends only now, encouraged discontinuing, he already has cadiomyopathy from alcohol

## 2014-04-08 NOTE — Assessment & Plan Note (Signed)
Pt incapable of managing his finances, was unable to do simple math calculation as far as appropriate change he should get if went to grocery store. He needs assistance with money and medication management as well as with transport. Request for [facility] services is appropriate

## 2014-04-08 NOTE — Assessment & Plan Note (Addendum)
Currently denies symptoms of heart failure and is clinicaly stable. Counseled re need to d/c alcohol and nicotine for cardiac protection

## 2014-06-01 ENCOUNTER — Encounter: Payer: Self-pay | Admitting: Family Medicine

## 2014-06-01 ENCOUNTER — Ambulatory Visit (INDEPENDENT_AMBULATORY_CARE_PROVIDER_SITE_OTHER): Payer: PRIVATE HEALTH INSURANCE | Admitting: Family Medicine

## 2014-06-01 ENCOUNTER — Encounter (INDEPENDENT_AMBULATORY_CARE_PROVIDER_SITE_OTHER): Payer: Self-pay

## 2014-06-01 VITALS — BP 140/82 | HR 72 | Resp 16 | Ht 64.0 in | Wt 158.0 lb

## 2014-06-01 DIAGNOSIS — Z720 Tobacco use: Secondary | ICD-10-CM

## 2014-06-01 DIAGNOSIS — Z0000 Encounter for general adult medical examination without abnormal findings: Secondary | ICD-10-CM

## 2014-06-01 DIAGNOSIS — I426 Alcoholic cardiomyopathy: Secondary | ICD-10-CM

## 2014-06-01 DIAGNOSIS — Z23 Encounter for immunization: Secondary | ICD-10-CM | POA: Insufficient documentation

## 2014-06-01 DIAGNOSIS — R002 Palpitations: Secondary | ICD-10-CM

## 2014-06-01 MED ORDER — METOPROLOL TARTRATE 25 MG PO TABS: 25.0000 mg | ORAL_TABLET | Freq: Two times a day (BID) | ORAL | Status: DC

## 2014-06-01 MED ORDER — LISINOPRIL 2.5 MG PO TABS: 2.5000 mg | ORAL_TABLET | Freq: Every day | ORAL | Status: DC

## 2014-06-01 NOTE — Patient Instructions (Signed)
Annual physical exam in 4.5 month, call if you need me before  Flu vaccine today  PLEASE FILL medicatiions for your heart and your blood pressure TODAY, you NEED BOTH,  Start aspirin 81mg  every day for your heart  STOP DRINKING ALCOHOL and STOP SNUFF  You DO NEED SOMEONE to supervise your money

## 2014-06-01 NOTE — Assessment & Plan Note (Signed)
Vaccine administered at visit.  

## 2014-06-01 NOTE — Progress Notes (Signed)
Preventive Screening-Counseling & Management   Patient present here today for a Medicare annual wellness visit.   Current Problems (verified)   Medications Prior to Visit Allergies (verified)   PAST HISTORY  Family History (verified)   Social History Never been married, 1 daughter born in 2009, lives alone. Currently unemployed    Risk Factors  Current exercise habits: States he exercises everyday at home for about an hour- push ups, jumping jacks  Dietary issues discussed: Heart healthy diet discussed. Encouraged to limit red meat and eat more chicken fish and [place] and more fruits and vegetables    Cardiac risk factors: cardiomyopathy, nicotine, excessive ETOH  Depression Screen  (Note: if answer to either of the following is "Yes", a more complete depression screening is indicated)   Over the past two weeks, have you felt down, depressed or hopeless? No  Over the past two weeks, have you felt little interest or pleasure in doing things? No  Have you lost interest or pleasure in daily life? No  Do you often feel hopeless? No  Do you cry easily over simple problems? No   Activities of Daily Living  In your present state of health, do you have any difficulty performing the following activities?  Driving?: doesn't drive , never Managing money?: yes , needs supervision, incapable of simple math Feeding yourself?:No Getting from bed to chair?:No Climbing a flight of stairs?:No Preparing food and eating?:No Bathing or showering?:No Getting dressed?:No Getting to the toilet?:No Using the toilet?:No Moving around from place to place?: No  Fall Risk Assessment In the past year have you fallen or had a near fall?:No Are you currently taking any medications that make you dizzy?:No   Hearing Difficulties: No Do you often ask people to speak up or repeat themselves?:No Do you experience ringing or noises in your ears?:No Do you have difficulty understanding soft or  whispered voices?:No  Cognitive Testing  Alert? Yes Normal Appearance?Yes  Oriented to person? Yes Place? Yes  Time? Yes  Displays appropriate judgment?Yes  Can read the correct time from a watch face? yes Are you having problems remembering things?No  Advanced Directives have been discussed with the patient?Yes.  He is a full code  List the Names of Other Physician/Practitioners you currently use: has seen cardiology   Indicate any recent Medical Services you may have received from other than [facility] providers in the past year (date may be approximate). none  Assessment:    Annual Wellness Exam   Plan:    Medicare Attestation  I have personally reviewed:  The patient's medical and social history  Their use of alcohol, tobacco or illicit drugs  Their current medications and supplements  The patient's functional ability including ADLs,fall risks, home safety risks, cognitive, and hearing and visual impairment  Diet and physical activities  Evidence for depression or mood disorders  The patient's weight, height, BMI, and visual acuity have been recorded in the chart. I have made referrals, counseling, and provided education to the patient based on review of the above and I have provided the patient with a written personalized care plan for preventive services.   Initial Medicare annual wellness visit Annual exam as documented. Counseling done  re healthy lifestyle involving commitment to 150 minutes exercise per week, heart healthy diet, and attaining healthy weight.The importance of adequate sleep also discussed.  Changes in health habits are decided on by the patient with goals and time frames  set for achieving them. Immunization and cancer screening needs  are specifically addressed at this visit.   Need for prophylactic vaccination and inoculation against influenza Vaccine administered at visit.

## 2014-06-01 NOTE — Assessment & Plan Note (Signed)
Annual exam as documented. Counseling done  re healthy lifestyle involving commitment to 150 minutes exercise per week, heart healthy diet, and attaining healthy weight.The importance of adequate sleep also discussed. Changes in health habits are decided on by the patient with goals and time frames  set for achieving them. Immunization and cancer screening needs are specifically addressed at this visit. 

## 2014-10-12 ENCOUNTER — Ambulatory Visit (INDEPENDENT_AMBULATORY_CARE_PROVIDER_SITE_OTHER): Payer: 59 | Admitting: Family Medicine

## 2014-10-12 ENCOUNTER — Encounter: Payer: Self-pay | Admitting: Family Medicine

## 2014-10-12 VITALS — BP 104/70 | HR 70 | Resp 18 | Ht 64.0 in | Wt 163.0 lb

## 2014-10-12 DIAGNOSIS — Z0000 Encounter for general adult medical examination without abnormal findings: Secondary | ICD-10-CM | POA: Insufficient documentation

## 2014-10-12 DIAGNOSIS — Z1211 Encounter for screening for malignant neoplasm of colon: Secondary | ICD-10-CM

## 2014-10-12 DIAGNOSIS — I426 Alcoholic cardiomyopathy: Secondary | ICD-10-CM

## 2014-10-12 LAB — HEMOCCULT GUIAC POC 1CARD (OFFICE): Fecal Occult Blood, POC: NEGATIVE

## 2014-10-12 NOTE — Assessment & Plan Note (Signed)

## 2014-10-12 NOTE — Patient Instructions (Addendum)
Annual wellness October  8 or after, call if yopu need me before  You are referred to cardiology, important that you go.  Pls work on stopping snuff and alcohol top protect your heart and health  Pls get the shingles vaccine a your pharmacy topday, take your ins cards, they will let you know the cost , I think less than $10

## 2014-10-12 NOTE — Progress Notes (Signed)
   Subjective:    Patient ID: Alexander Simmons, male    DOB: February 03, 1956, 59 y.o.   MRN: [id number]  HPI Patient is in for annual physical  exam. No other health concerns are expressed or addressed at the visit.    Review of Systems See HPI     Objective:   Physical Exam BP 104/70 mmHg  Pulse 70  Resp 18  Ht 5\' 4"  (1.626 m)  Wt 163 lb (73.936 kg)  BMI 27.97 kg/m2  SpO2 98% Pleasant well nourished male, alert and oriented x 3, in no cardio-pulmonary distress. Afebrile. HEENT No facial trauma or asymetry. Sinuses non tender. EOMI, PERTL, fundoscopic exam is negative for hemorhages or exudates. External ears normal, tympanic membranes clear. Oropharynx moist, no exudate, poor  dentition. Neck: supple, no adenopathy,JVD or thyromegaly.No bruits.  Chest: Clear to ascultation bilaterally.No crackles or wheezes. Non tender to palpation  Breast: No asymetry,no masses. No nipple discharge or inversion. No axillary or supraclavicular adenopathy  Cardiovascular system; Heart sounds normal,  S1 and  S2 ,no S3.  No murmur, or thrill. Apical beat not displaced Peripheral pulses normal.  Abdomen: Soft, non tender, no organomegaly or masses. No bruits. Bowel sounds normal. No guarding, tenderness or rebound.  Rectal:  Normal sphincter tone. No hemorrhoids or  masses. guaiac negative stool. Prostate smooth and firm  GU: No penile lesion or discharge. No testicular mass or tenderness.  Musculoskeletal exam: Full ROM of spine, hips , shoulders and knees. No deformity ,swelling or crepitus noted. No muscle wasting or atrophy.   Neurologic: Cranial nerves 2 to 12 intact. Power, tone ,sensation and reflexes normal throughout. No disturbance in gait. No tremor.  Skin: Intact, no ulceration, erythema , scaling or rash noted. Pigmentation normal throughout  Psych; Normal mood and affect. Judgement and concentration mild MR       Assessment & Plan:  Annual physical  exam Annual exam as documented. Counseling done  re healthy lifestyle involving commitment to 150 minutes exercise per week, heart healthy diet, and attaining healthy weight.The importance of adequate sleep also discussed. Regular seat belt use and home safety, is also discussed. Changes in health habits are decided on by the patient with goals and time frames  set for achieving them. Immunization and cancer screening needs are specifically addressed at this visit.

## 2014-10-20 ENCOUNTER — Encounter: Payer: Self-pay | Admitting: Cardiovascular Disease

## 2014-10-20 ENCOUNTER — Ambulatory Visit (INDEPENDENT_AMBULATORY_CARE_PROVIDER_SITE_OTHER): Payer: 59 | Admitting: Cardiovascular Disease

## 2014-10-20 VITALS — BP 152/90 | HR 81 | Ht 62.0 in | Wt 162.8 lb

## 2014-10-20 DIAGNOSIS — R9431 Abnormal electrocardiogram [ECG] [EKG]: Secondary | ICD-10-CM | POA: Diagnosis not present

## 2014-10-20 DIAGNOSIS — I426 Alcoholic cardiomyopathy: Secondary | ICD-10-CM | POA: Diagnosis not present

## 2014-10-20 DIAGNOSIS — I10 Essential (primary) hypertension: Secondary | ICD-10-CM

## 2014-10-20 DIAGNOSIS — I493 Ventricular premature depolarization: Secondary | ICD-10-CM | POA: Diagnosis not present

## 2014-10-20 DIAGNOSIS — R002 Palpitations: Secondary | ICD-10-CM

## 2014-10-20 MED ORDER — LISINOPRIL 10 MG PO TABS: 10.0000 mg | ORAL_TABLET | Freq: Every day | ORAL | Status: DC

## 2014-10-20 NOTE — Patient Instructions (Signed)
Your physician wants you to follow-up in: 1 year with Dr Virgina Jock will receive a reminder letter in the mail two months in advance. If you don't receive a letter, please call our office to schedule the follow-up appointment.      INCREASE Lisinopril to 10 mg daily    Your physician recommends that you return for lab work  For CMET, FASTING     Thank you for choosing [facility] !

## 2014-10-20 NOTE — Progress Notes (Signed)
Patient ID: Alexander Simmons, male   DOB: [birth year], 59 y.o.   MRN: [id number]      SUBJECTIVE: The patient is a 59 year old male who I am evaluating for the first time. He was previously seen by K. Lawrence NP and Dr. Lattie Haw in the past. He has a history of alcoholism and alcoholic cardiomyopathy as well as palpitations and paroxysmal SVT. He also reportedly has a history of intellectual impairment. Echocardiogram in July 2014 demonstrated normal left ventricular systolic function, LVEF 09-73%, mild LVH, and grade 2 diastolic dysfunction.  He denies exertional chest pain. He occasionally has heartburn when he is lying down at night. He sometimes feels winded after walking to the store and back but this is not consistent. He denies orthopnea and leg swelling. He also denies palpitations and dizziness. He denies smoking.  ECG performed in the office today demonstrates sinus rhythm with frequent PVCs and mild T-wave inversions in leads V3 through V6 suggestive of anterior and lateral ischemia.  Review of Systems: As per "subjective", otherwise negative.  No Known Allergies  Current Outpatient Prescriptions  Medication Sig Dispense Refill  . aspirin 81 MG tablet Take 1 tablet (81 mg total) by mouth daily. 30 tablet 11  . lisinopril (PRINIVIL,ZESTRIL) 2.5 MG tablet Take 1 tablet (2.5 mg total) by mouth daily. 90 tablet 3  . metoprolol tartrate (LOPRESSOR) 25 MG tablet Take 1 tablet (25 mg total) by mouth 2 (two) times daily. 180 tablet 3   No current facility-administered medications for this visit.    Past Medical History  Diagnosis Date  . Hyperlipidemia   . Allergy   . Syncope 12/2009    Attributed to use of narcotics plus alcohol  . Cor pulmonale     By echocardiography  . Alcohol abuse   . Small bowel obstruction   . Glaucoma   . Tobacco abuse   . Hypertension     Past Surgical History  Procedure Laterality Date  . Tooth extraction    . Pilonidal cyst excision  2005    Dr.  Irving Shows  . Colonoscopy N/A 01/28/2013    Procedure: COLONOSCOPY;  Surgeon: Rogene Houston, MD;  Location: [facility];  Service: Endoscopy;  Laterality: N/A;  1225    History   Social History  . Marital Status: Single    Spouse Name: N/A  . Number of Children: N/A  . Years of Education: N/A   Occupational History  . Not on file.   Social History Main Topics  . Smoking status: Never Smoker   . Smokeless tobacco: Current User    Types: Snuff, Chew     Comment: chews tobacco since age 26.  . Alcohol Use: 12.0 oz/week    20 Shots of liquor per week     Comment: 1 pint of gin daily (4cups daily)  . Drug Use: No  . Sexual Activity: Not Currently   Other Topics Concern  . Not on file   Social History Narrative    BP 152/90  Pulse 81  SpO2 99% Weight 162 lb 12.8 oz (73.846 kg) Height 5\' 2"  (1.575 m)    PHYSICAL EXAM General: NAD HEENT: Normal. Neck: No JVD, no thyromegaly. Lungs: Clear to auscultation bilaterally with normal respiratory effort. CV: Nondisplaced PMI.  Regular rate and rhythm with frequent premature contractions, normal S1/S2, no S3/+S4, no murmur. No pretibial or periankle edema.  No carotid bruit.   Abdomen: Soft, nontender,no distention.  Neurologic: Alert and oriented.  Psych: Normal  affect. Skin: Normal. Musculoskeletal: No gross deformities. Extremities: No clubbing or cyanosis.   ECG: Most recent ECG reviewed.      ASSESSMENT AND PLAN: 1. Alcohol-related cardiomyopathy: LV systolic function normal in 7/14. Continue metoprolol and lisinopril. 2. Essential hypertension with grade 2 diastolic dysfunction: No evidence of decompensation although he has a positive S4, indicative of elevated intracardiac pressures. BP is also elevated, thus will increase lisinopril to 10 mg daily. Will obtain a complete metabolic panel. 3. Palpitations/paroxysmal SVT: Symptomatically stable. Continue metoprolol. 4. Abnormal ECG with T wave inversions and  frequent PVC's: He denies exertional chest pain and only occasionally has exertional dyspnea. I will not pursue stress testing at this time but would do so if he were to develop significant symptoms.  Dispo: f/u 1 year.  Kate Sable, M.D., F.A.C.C.

## 2014-10-21 DIAGNOSIS — I10 Essential (primary) hypertension: Secondary | ICD-10-CM | POA: Diagnosis not present

## 2014-10-21 LAB — COMPREHENSIVE METABOLIC PANEL
ALK PHOS: 60 U/L (ref 39–117)
ALT: 10 U/L (ref 0–53)
AST: 14 U/L (ref 0–37)
Albumin: 3.9 g/dL (ref 3.5–5.2)
BUN: 13 mg/dL (ref 6–23)
CO2: 26 mEq/L (ref 19–32)
CREATININE: 1.21 mg/dL (ref 0.50–1.35)
Calcium: 9.4 mg/dL (ref 8.4–10.5)
Chloride: 103 mEq/L (ref 96–112)
Glucose, Bld: 96 mg/dL (ref 70–99)
Potassium: 3.9 mEq/L (ref 3.5–5.3)
Sodium: 139 mEq/L (ref 135–145)
Total Bilirubin: 0.7 mg/dL (ref 0.2–1.2)
Total Protein: 6.9 g/dL (ref 6.0–8.3)

## 2014-11-08 ENCOUNTER — Ambulatory Visit: Payer: 59 | Admitting: Cardiovascular Disease

## 2015-02-14 ENCOUNTER — Ambulatory Visit (INDEPENDENT_AMBULATORY_CARE_PROVIDER_SITE_OTHER): Payer: Medicare Other | Admitting: Family Medicine

## 2015-02-14 ENCOUNTER — Encounter: Payer: Self-pay | Admitting: Family Medicine

## 2015-02-14 VITALS — BP 120/80 | HR 96 | Resp 18 | Ht 64.0 in | Wt 153.8 lb

## 2015-02-14 DIAGNOSIS — R002 Palpitations: Secondary | ICD-10-CM

## 2015-02-14 DIAGNOSIS — Z125 Encounter for screening for malignant neoplasm of prostate: Secondary | ICD-10-CM

## 2015-02-14 DIAGNOSIS — I426 Alcoholic cardiomyopathy: Secondary | ICD-10-CM

## 2015-02-14 DIAGNOSIS — Z720 Tobacco use: Secondary | ICD-10-CM

## 2015-02-14 DIAGNOSIS — I10 Essential (primary) hypertension: Secondary | ICD-10-CM

## 2015-02-14 DIAGNOSIS — E785 Hyperlipidemia, unspecified: Secondary | ICD-10-CM

## 2015-02-14 DIAGNOSIS — F17200 Nicotine dependence, unspecified, uncomplicated: Secondary | ICD-10-CM

## 2015-02-14 DIAGNOSIS — F10288 Alcohol dependence with other alcohol-induced disorder: Secondary | ICD-10-CM | POA: Diagnosis not present

## 2015-02-14 NOTE — Patient Instructions (Signed)
Keep appt in October  Fasting lipid, cmp, PSA, HIV, CBc and TSH October 16 or after, 1 week before follow up  PLEASE work on quitting Snuff and alcohol, also on weight loss, this willo improve your health.  Be SAFE!  Thanks for choosing [facility], we consider it a privelige to serve you.

## 2015-02-14 NOTE — Assessment & Plan Note (Addendum)
Patient counseled for approximately 5 minutes regarding the health risks of ongoing nicotine use, specifically all types of cancer, heart disease, stroke and respiratory failure. The options available for help with cessation ,the behavioral changes to assist the process, and the option to either gradully reduce usage  Or abruptly stop.is also discussed. Pt is also encouraged to set specific goals in nicotine  used daily, as well as to set a quit date.  '

## 2015-02-14 NOTE — Progress Notes (Signed)
Subjective:    Patient ID: Alexander Simmons, male    DOB: [birth year], 59 y.o.   MRN: [id number]  HPI    Alexander Simmons     MRN: [id number]      DOB: 07/14/1956   HPI Alexander Simmons is here for follow up and re-evaluation of chronic medical conditions, medication management and review of any available recent lab and radiology data.  Preventive health is updated, specifically  Cancer screening and Immunization.   Questions or concerns regarding consultations or procedures which the PT has had in the interim are  addressed. The PT denies any adverse reactions to current medications since the last visit.  There are no new concerns.  There are no specific complaints   ROS Denies recent fever or chills. Denies sinus pressure, nasal congestion, ear pain or sore throat. Denies chest congestion, productive cough or wheezing. Denies chest pains, palpitations and leg swelling Denies abdominal pain, nausea, vomiting,diarrhea or constipation.   Denies dysuria, frequency, hesitancy or incontinence. Denies joint pain, swelling and limitation in mobility. Denies headaches, seizures, numbness, or tingling. Denies depression, anxiety or insomnia. Denies skin break down or rash.   PE  BP 120/80 mmHg  Pulse 96  Resp 18  Ht 5\' 4"  (1.626 m)  Wt 153 lb 12 oz (69.741 kg)  BMI 26.38 kg/m2  Patient alert and oriented and in no cardiopulmonary distress.  HEENT: No facial asymmetry, EOMI,   oropharynx pink and moist.  Neck supple no JVD, no mass.  Chest: Clear to auscultation bilaterally.Decreased though adequate  CVS: S1, S2 no murmurs, no S3.Regular rate.  ABD: Soft non tender.   Ext: No edema  MS: Adequate ROM spine, shoulders, hips and knees.  Skin: Intact, no ulcerations or rash noted.  Psych: Good eye contact, normal affect. Memory intact not anxious or depressed appearing.  CNS: CN 2-12 intact, power,  normal throughout.no focal deficits noted.   Assessment & Plan   Tobacco  abuse Patient counseled for approximately 5 minutes regarding the health risks of ongoing nicotine use, specifically all types of cancer, heart disease, stroke and respiratory failure. The options available for help with cessation ,the behavioral changes to assist the process, and the option to either gradully reduce usage  Or abruptly stop.is also discussed. Pt is also encouraged to set specific goals in nicotine  used daily, as well as to set a quit date.  '  Hypertension, essential Controlled, no change in medication DASH diet and commitment to daily physical activity for a minimum of 30 minutes discussed and encouraged, as a part of hypertension management. The importance of attaining a healthy weight is also discussed.  BP/Weight 02/14/2015 10/20/2014 10/12/2014 06/01/2014 04/06/2014 04/13/2013 10/05/4707  Systolic BP 628 366 294 765 465 035 465  Diastolic BP 80 90 70 82 82 76 82  Wt. (Lbs) 153.75 162.8 163 158 156.04 146.4 153  BMI 26.38 29.77 27.97 27.11 26.77 25.12 68.12        Alcoholic cardiomyopathy Currently asymptomatic, however , due to ongoing alcohol use , damage continues to  Take place, encouraged again to join AA to help with quitting  Alcohol dependence Reports less alcohol use, however need to quit , already has cardiomyopathy from alcohol, unwilling to set a quit date but will continue  to ry to reduce use  Hyperlipidemia Hyperlipidemia:Low fat diet discussed and encouraged.   Lipid Panel  Lab Results  Component Value Date   CHOL 163 04/06/2014   HDL 43 04/06/2014  LDLCALC 73 04/06/2014   TRIG 235* 04/06/2014   CHOLHDL 3.8 04/06/2014    Reduced fried and fatty food intake needed, as TG are high         Review of Systems     Objective:   Physical Exam        Assessment & Plan:

## 2015-02-27 NOTE — Assessment & Plan Note (Signed)
Controlled, no change in medication DASH diet and commitment to daily physical activity for a minimum of 30 minutes discussed and encouraged, as a part of hypertension management. The importance of attaining a healthy weight is also discussed.  BP/Weight 02/14/2015 10/20/2014 10/12/2014 06/01/2014 04/06/2014 04/13/2013 1/75/1025  Systolic BP 852 778 242 353 614 431 540  Diastolic BP 80 90 70 82 82 76 82  Wt. (Lbs) 153.75 162.8 163 158 156.04 146.4 153  BMI 26.38 29.77 27.97 27.11 26.77 25.12 26.25

## 2015-02-27 NOTE — Assessment & Plan Note (Signed)
Currently asymptomatic, however , due to ongoing alcohol use , damage continues to  Take place, encouraged again to join AA to help with quitting

## 2015-02-27 NOTE — Assessment & Plan Note (Signed)
Hyperlipidemia:Low fat diet discussed and encouraged.   Lipid Panel  Lab Results  Component Value Date   CHOL 163 04/06/2014   HDL 43 04/06/2014   LDLCALC 73 04/06/2014   TRIG 235* 04/06/2014   CHOLHDL 3.8 04/06/2014    Reduced fried and fatty food intake needed, as TG are high

## 2015-02-27 NOTE — Assessment & Plan Note (Signed)
Reports less alcohol use, however need to quit , already has cardiomyopathy from alcohol, unwilling to set a quit date but will continue  to ry to reduce use

## 2015-02-27 NOTE — Assessment & Plan Note (Signed)
Denies any recent palapitrations

## 2015-06-06 ENCOUNTER — Encounter: Payer: 59 | Admitting: Family Medicine

## 2015-06-22 ENCOUNTER — Encounter: Payer: 59 | Admitting: Family Medicine

## 2015-06-22 ENCOUNTER — Encounter: Payer: Self-pay | Admitting: *Deleted

## 2016-01-08 ENCOUNTER — Encounter: Payer: Self-pay | Admitting: Family Medicine

## 2016-01-08 ENCOUNTER — Ambulatory Visit (INDEPENDENT_AMBULATORY_CARE_PROVIDER_SITE_OTHER): Payer: Medicare Other | Admitting: Family Medicine

## 2016-01-08 VITALS — BP 126/82 | HR 76 | Resp 18 | Ht 64.0 in | Wt 148.0 lb

## 2016-01-08 DIAGNOSIS — Z0000 Encounter for general adult medical examination without abnormal findings: Secondary | ICD-10-CM

## 2016-01-08 DIAGNOSIS — E663 Overweight: Secondary | ICD-10-CM

## 2016-01-08 DIAGNOSIS — I10 Essential (primary) hypertension: Secondary | ICD-10-CM | POA: Diagnosis not present

## 2016-01-08 DIAGNOSIS — E785 Hyperlipidemia, unspecified: Secondary | ICD-10-CM | POA: Diagnosis not present

## 2016-01-08 DIAGNOSIS — Z720 Tobacco use: Secondary | ICD-10-CM

## 2016-01-08 DIAGNOSIS — Z1159 Encounter for screening for other viral diseases: Secondary | ICD-10-CM | POA: Diagnosis not present

## 2016-01-08 DIAGNOSIS — Z125 Encounter for screening for malignant neoplasm of prostate: Secondary | ICD-10-CM

## 2016-01-08 NOTE — Assessment & Plan Note (Signed)

## 2016-01-08 NOTE — Progress Notes (Signed)
Subjective:    Patient ID: Alexander Simmons, male    DOB: [birth year], 60 y.o.   MRN: [id number]  HPI  Preventive Screening-Counseling & Management   Patient present here today for a Medicare annual wellness visit.   Current Problems (verified)   Medications Prior to Visit Allergies (verified)   PAST HISTORY  Family History  Social History Snuff and alcohol   Risk Factors  Current exercise habits:  Cuts yards Dietary issues discussed:heart healthy diet rih in vegetables   Cardiac risk factors:   Depression Screen  (Note: if answer to either of the following is "Yes", a more complete depression screening is indicated)   Over the past two weeks, have you felt down, depressed or hopeless? No  Over the past two weeks, have you felt little interest or pleasure in doing things? No  Have you lost interest or pleasure in daily life? No  Do you often feel hopeless? No  Do you cry easily over simple problems? No   Activities of Daily Living  In your present state of health, do you have any difficulty performing the following activities?  Driving?: never drove Managing money?: yes .Cousin manages his money Feeding yourself?:No Getting from bed to chair?:No Climbing a flight of stairs?:No Preparing food and eating?:No Bathing or showering?:No Getting dressed?:No Getting to the toilet?:No Using the toilet?:No Moving around from place to place?: No  Fall Risk Assessment In the past year have you fallen or had a near fall?:No Are you currently taking any medications that make you dizzy?:No   Hearing Difficulties: No Do you often ask people to speak up or repeat themselves?:No Do you experience ringing or noises in your ears?:No Do you have difficulty understanding soft or whispered voices?:No  Cognitive Testing  Alert? Yes Normal Appearance?Yes  Oriented to person? Yes Place? Yes  Time? Yes  Displays appropriate judgment?Yes  Can read the correct time from a watch  face? yes Are you having problems remembering things?No  Advanced Directives have been discussed with the patient?Yes , full code   List the Names of Other Physician/Practitioners you currently use:    Indicate any recent Medical Services you may have received from other than [facility] providers in the past year (date may be approximate).   Assessment:    Annual Wellness Exam   Plan:    .  Medicare Attestation  I have personally reviewed:  The patient's medical and social history  Their use of alcohol, tobacco or illicit drugs  Their current medications and supplements  The patient's functional ability including ADLs,fall risks, home safety risks, cognitive, and hearing and visual impairment  Diet and physical activities  Evidence for depression or mood disorders  The patient's weight, height, BMI, and visual acuity have been recorded in the chart. I have made referrals, counseling, and provided education to the patient based on review of the above and I have provided the patient with a written personalized care plan for preventive services.     Review of Systems     Objective:   Physical Exam BP 126/82 mmHg  Pulse 76  Resp 18  Ht 5\' 4"  (1.626 m)  Wt 148 lb (67.132 kg)  BMI 25.39 kg/m2  SpO2 98%       Assessment & Plan:  Medicare annual wellness visit, subsequent Annual exam as documented. Counseling done  re healthy lifestyle involving commitment to 150 minutes exercise per week, heart healthy diet, and attaining healthy weight.The importance of adequate sleep also discussed.  Regular seat belt use and home safety, is also discussed. Changes in health habits are decided on by the patient with goals and time frames  set for achieving them. Immunization and cancer screening needs are specifically addressed at this visit.   Hypertension, essential Controlled, no change in medication DASH diet and commitment to daily physical activity for a minimum of 30 minutes  discussed and encouraged, as a part of hypertension management. The importance of attaining a healthy weight is also discussed.  BP/Weight 01/08/2016 02/14/2015 10/20/2014 10/12/2014 06/01/2014 04/06/2014 [id number]  Systolic BP 123XX123 [id number]  Diastolic BP 82 80 90 70 82 82 76  Wt. (Lbs) 148 153.75 162.8 163 158 156.04 146.4  BMI 25.39 26.38 29.77 27.97 27.11 26.77 25.12        Tobacco abuse Patient counseled for approximately 5 minutes regarding the health risks of ongoing nicotine use, specifically all types of cancer, heart disease, stroke and respiratory failure. The options available for help with cessation ,the behavioral changes to assist the process, and the option to either gradully reduce usage  Or abruptly stop.is also discussed. Pt is also encouraged to set specific goals in number of cigarettes used daily, as well as to set a quit date.     Hyperlipidemia Hyperlipidemia:Low fat diet discussed and encouraged.   Lipid Panel  Lab Results  Component Value Date   CHOL 115* 01/15/2016   HDL 45 01/15/2016   LDLCALC 23 01/15/2016   TRIG 237* 01/15/2016   CHOLHDL 2.6 01/15/2016    Needs to reduce fried and fatty foods    Overweight (BMI 25.0-29.9) Deteriorated. Patient re-educated about  the importance of commitment to a  minimum of 150 minutes of exercise per week.  The importance of healthy food choices with portion control discussed. Encouraged to start a food diary, count calories and to consider  joining a support group. Sample diet sheets offered. Goals set by the patient for the next several months.   Weight /BMI 01/08/2016 02/14/2015 10/20/2014  WEIGHT 148 lb 153 lb 12 oz 162 lb 12.8 oz  HEIGHT 5\' 4"  5\' 4"  5\' 2"   BMI 25.39 kg/m2 26.38 kg/m2 29.77 kg/m2    Current exercise per week 90 minutes.

## 2016-01-08 NOTE — Patient Instructions (Signed)
Physical exam in 4.5 month, call if you need me sooner  PLEASE work on stopping snuff and alcohol use so health improves  Fasting labs this week, TOMORROW please !  Thank you  for choosing [facility]. We consider it a privelige to serve you.  Delivering excellent health care in a caring and  compassionate way is our goal.  Partnering with you,  so that together we can achieve this goal is our strategy.

## 2016-01-15 DIAGNOSIS — E785 Hyperlipidemia, unspecified: Secondary | ICD-10-CM | POA: Diagnosis not present

## 2016-01-15 DIAGNOSIS — Z0000 Encounter for general adult medical examination without abnormal findings: Secondary | ICD-10-CM | POA: Diagnosis not present

## 2016-01-15 DIAGNOSIS — Z125 Encounter for screening for malignant neoplasm of prostate: Secondary | ICD-10-CM | POA: Diagnosis not present

## 2016-01-15 DIAGNOSIS — I10 Essential (primary) hypertension: Secondary | ICD-10-CM | POA: Diagnosis not present

## 2016-01-16 LAB — CBC
HCT: 40.3 % (ref 38.5–50.0)
Hemoglobin: 13.3 g/dL (ref 13.2–17.1)
MCH: 33.8 pg — AB (ref 27.0–33.0)
MCHC: 33 g/dL (ref 32.0–36.0)
MCV: 102.3 fL — AB (ref 80.0–100.0)
MPV: 9.2 fL (ref 7.5–12.5)
PLATELETS: 309 10*3/uL (ref 140–400)
RBC: 3.94 MIL/uL — ABNORMAL LOW (ref 4.20–5.80)
RDW: 14.8 % (ref 11.0–15.0)
WBC: 4.5 10*3/uL (ref 3.8–10.8)

## 2016-01-16 LAB — HIV ANTIBODY (ROUTINE TESTING W REFLEX): HIV: NONREACTIVE

## 2016-01-16 LAB — LIPID PANEL
CHOL/HDL RATIO: 2.6 ratio (ref <=5.0)
Cholesterol: 115 mg/dL — ABNORMAL LOW (ref 125–200)
HDL: 45 mg/dL (ref >=40)
LDL CALC: 23 mg/dL (ref <130)
Triglycerides: 237 mg/dL — ABNORMAL HIGH (ref <150)
VLDL: 47 mg/dL — ABNORMAL HIGH (ref <30)

## 2016-01-16 LAB — BASIC METABOLIC PANEL
BUN: 10 mg/dL (ref 7–25)
CO2: 28 mmol/L (ref 20–31)
Calcium: 9.2 mg/dL (ref 8.6–10.3)
Chloride: 99 mmol/L (ref 98–110)
Creat: 1.04 mg/dL (ref 0.70–1.25)
GLUCOSE: 108 mg/dL — AB (ref 65–99)
POTASSIUM: 3.8 mmol/L (ref 3.5–5.3)
Sodium: 139 mmol/L (ref 135–146)

## 2016-01-16 LAB — TSH: TSH: 1.57 m[IU]/L (ref 0.40–4.50)

## 2016-01-16 LAB — HEPATITIS C ANTIBODY: HCV Ab: NEGATIVE

## 2016-01-16 LAB — PSA, MEDICARE: PSA: 4.77 ng/mL — ABNORMAL HIGH (ref <=4.00)

## 2016-01-17 ENCOUNTER — Telehealth: Payer: Self-pay

## 2016-01-17 DIAGNOSIS — R972 Elevated prostate specific antigen [PSA]: Secondary | ICD-10-CM

## 2016-01-17 NOTE — Telephone Encounter (Signed)
-----   Message from Fayrene Helper, MD sent at 01/16/2016 10:59 AM EDT ----- Needs to see urology due to elevated PSA, pls explain and refer to [facility] , I will sign

## 2016-01-17 NOTE — Telephone Encounter (Signed)
Referral entered  

## 2016-01-21 DIAGNOSIS — E663 Overweight: Secondary | ICD-10-CM | POA: Insufficient documentation

## 2016-01-21 NOTE — Assessment & Plan Note (Signed)
Controlled, no change in medication DASH diet and commitment to daily physical activity for a minimum of 30 minutes discussed and encouraged, as a part of hypertension management. The importance of attaining a healthy weight is also discussed.  BP/Weight 01/08/2016 02/14/2015 10/20/2014 10/12/2014 06/01/2014 04/06/2014 [id number]  Systolic BP 123XX123 [id number]  Diastolic BP 82 80 90 70 82 82 76  Wt. (Lbs) 148 153.75 162.8 163 158 156.04 146.4  BMI 25.39 26.38 29.77 27.97 27.11 26.77 25.12

## 2016-01-21 NOTE — Assessment & Plan Note (Signed)
Deteriorated. Patient re-educated about  the importance of commitment to a  minimum of 150 minutes of exercise per week.  The importance of healthy food choices with portion control discussed. Encouraged to start a food diary, count calories and to consider  joining a support group. Sample diet sheets offered. Goals set by the patient for the next several months.   Weight /BMI 01/08/2016 02/14/2015 10/20/2014  WEIGHT 148 lb 153 lb 12 oz 162 lb 12.8 oz  HEIGHT 5\' 4"  5\' 4"  5\' 2"   BMI 25.39 kg/m2 26.38 kg/m2 29.77 kg/m2    Current exercise per week 90 minutes.

## 2016-01-21 NOTE — Assessment & Plan Note (Signed)
Hyperlipidemia:Low fat diet discussed and encouraged.   Lipid Panel  Lab Results  Component Value Date   CHOL 115* 01/15/2016   HDL 45 01/15/2016   LDLCALC 23 01/15/2016   TRIG 237* 01/15/2016   CHOLHDL 2.6 01/15/2016    Needs to reduce fried and fatty foods

## 2016-01-21 NOTE — Assessment & Plan Note (Signed)

## 2016-02-19 ENCOUNTER — Ambulatory Visit (INDEPENDENT_AMBULATORY_CARE_PROVIDER_SITE_OTHER): Payer: Medicare Other | Admitting: Cardiovascular Disease

## 2016-02-19 ENCOUNTER — Encounter: Payer: Self-pay | Admitting: Cardiovascular Disease

## 2016-02-19 VITALS — BP 143/89 | HR 58 | Ht 64.0 in | Wt 151.0 lb

## 2016-02-19 DIAGNOSIS — I493 Ventricular premature depolarization: Secondary | ICD-10-CM

## 2016-02-19 DIAGNOSIS — R002 Palpitations: Secondary | ICD-10-CM | POA: Diagnosis not present

## 2016-02-19 DIAGNOSIS — I10 Essential (primary) hypertension: Secondary | ICD-10-CM

## 2016-02-19 DIAGNOSIS — R9431 Abnormal electrocardiogram [ECG] [EKG]: Secondary | ICD-10-CM

## 2016-02-19 DIAGNOSIS — I426 Alcoholic cardiomyopathy: Secondary | ICD-10-CM | POA: Diagnosis not present

## 2016-02-19 NOTE — Patient Instructions (Signed)
Medication Instructions:   Your physician recommends that you continue on your current medications as directed. Please refer to the Current Medication list given to you today.  Labwork:  NONE  Testing/Procedures:  NONE  Follow-Up:  Your physician recommends that you schedule a follow-up appointment in: 1 year at the [place] office. You will receive a reminder letter in the mail in about 10 months reminding you to call and schedule your appointment. If you don't receive this letter, please contact our office.  Any Other Special Instructions Will Be Listed Below (If Applicable).  If you need a refill on your cardiac medications before your next appointment, please call your pharmacy. 

## 2016-02-19 NOTE — Progress Notes (Signed)
Patient ID: Alexander Simmons, male   DOB: [birth year], 60 y.o.   MRN: [id number]      SUBJECTIVE: The patient presents for routine follow-up. He has a history of alcoholism and alcoholic cardiomyopathy as well as palpitations and paroxysmal SVT. He also reportedly has a history of intellectual impairment. Echocardiogram in July 2014 demonstrated normal left ventricular systolic function, LVEF 0000000, mild LVH, and grade 2 diastolic dysfunction.  ECG performed in the office today which I personally interpreted demonstrated sinus rhythm with PVCs and diffuse T-wave inversions.  Denies chest pain, palpitations, dizziness, syncope, leg swelling, shortness of breath.  Review of Systems: As per "subjective", otherwise negative.  No Known Allergies  Current Outpatient Prescriptions  Medication Sig Dispense Refill  . aspirin 81 MG tablet Take 1 tablet (81 mg total) by mouth daily. 30 tablet 11  . lisinopril (PRINIVIL,ZESTRIL) 10 MG tablet Take 1 tablet (10 mg total) by mouth daily. 90 tablet 3  . metoprolol tartrate (LOPRESSOR) 25 MG tablet Take 1 tablet (25 mg total) by mouth 2 (two) times daily. 180 tablet 3   No current facility-administered medications for this visit.    Past Medical History  Diagnosis Date  . Hyperlipidemia   . Allergy   . Syncope 12/2009    Attributed to use of narcotics plus alcohol  . Cor pulmonale ([facility])     By echocardiography  . Alcohol abuse   . Small bowel obstruction ([place])   . Glaucoma   . Tobacco abuse   . Hypertension     Past Surgical History  Procedure Laterality Date  . Tooth extraction    . Pilonidal cyst excision  2005    Dr. Irving Shows  . Colonoscopy N/A 01/28/2013    Procedure: COLONOSCOPY;  Surgeon: Rogene Houston, MD;  Location[facility];  Service: Endoscopy;  Laterality: N/A;  1225    Social History   Social History  . Marital Status: Single    Spouse Name: N/A  . Number of Children: N/A  . Years of Education: N/A    Occupational History  . Not on file.   Social History Main Topics  . Smoking status: Never Smoker   . Smokeless tobacco: Current User    Types: Snuff, Chew     Comment: chews tobacco since age 50.  . Alcohol Use: 12.0 oz/week    20 Shots of liquor per week     Comment: 1 pint of gin daily (4cups daily)  . Drug Use: No  . Sexual Activity: Not Currently   Other Topics Concern  . Not on file   Social History Narrative     Filed Vitals:   02/19/16 1534  Height: 5\' 4"  (1.626 m)  Weight: 151 lb (68.493 kg)    PHYSICAL EXAM General: NAD HEENT: Normal. Neck: No JVD, no thyromegaly. Lungs: Clear to auscultation bilaterally with normal respiratory effort. CV: Nondisplaced PMI. Regular rate and rhythm with premature contractions, normal S1/S2, no S3/S4, no murmur. No pretibial or periankle edema.   Abdomen: Soft, nontender,no distention.  Neurologic: Alert and oriented.  Psych: Normal affect. Skin: Normal. Musculoskeletal: No gross deformities. Extremities: No clubbing or cyanosis.    ECG: Most recent ECG reviewed.      ASSESSMENT AND PLAN: 1. Alcohol-related cardiomyopathy: LV systolic function normal in 7/14. Continue metoprolol and lisinopril.  2. Essential hypertension with grade 2 diastolic dysfunction: No evidence of decompensation. BP is elevated. Will monitor for now.  3. Palpitations/paroxysmal SVT: Symptomatically stable. Continue metoprolol.  4. Abnormal ECG with T wave inversions and frequent PVC's: He denies exertional chest pain and only occasionally has exertional dyspnea. I will not pursue stress testing at this time but would do so if he were to develop significant symptoms.  Dispo: f/u 1 year.   Kate Sable, M.D., F.A.C.C.

## 2016-02-23 ENCOUNTER — Other Ambulatory Visit: Payer: Self-pay | Admitting: Urology

## 2016-02-23 ENCOUNTER — Other Ambulatory Visit (HOSPITAL_COMMUNITY)
Admission: RE | Admit: 2016-02-23 | Discharge: 2016-02-23 | Disposition: A | Discharge status: Home / Self Care | Payer: Medicare Other | Source: Other Acute Inpatient Hospital | Attending: Urology | Admitting: Urology

## 2016-02-23 ENCOUNTER — Ambulatory Visit (INDEPENDENT_AMBULATORY_CARE_PROVIDER_SITE_OTHER): Payer: Medicare Other | Admitting: Urology

## 2016-02-23 DIAGNOSIS — R972 Elevated prostate specific antigen [PSA]: Secondary | ICD-10-CM | POA: Insufficient documentation

## 2016-02-23 LAB — URINE MICROSCOPIC-ADD ON

## 2016-02-23 LAB — URINALYSIS, ROUTINE W REFLEX MICROSCOPIC
Bilirubin Urine: NEGATIVE
GLUCOSE, UA: NEGATIVE mg/dL
Ketones, ur: NEGATIVE mg/dL
LEUKOCYTES UA: NEGATIVE
NITRITE: NEGATIVE
Specific Gravity, Urine: >1.03 — ABNORMAL HIGH (ref 1.005–1.030)
pH: 6 (ref 5.0–8.0)

## 2016-03-22 ENCOUNTER — Ambulatory Visit (HOSPITAL_COMMUNITY): Payer: Medicare Other

## 2016-04-05 ENCOUNTER — Ambulatory Visit (INDEPENDENT_AMBULATORY_CARE_PROVIDER_SITE_OTHER): Payer: Medicare Other | Admitting: Urology

## 2016-04-05 DIAGNOSIS — R972 Elevated prostate specific antigen [PSA]: Secondary | ICD-10-CM | POA: Diagnosis not present

## 2016-06-27 ENCOUNTER — Encounter: Payer: Self-pay | Admitting: Family Medicine

## 2016-06-27 ENCOUNTER — Ambulatory Visit (INDEPENDENT_AMBULATORY_CARE_PROVIDER_SITE_OTHER): Payer: Medicare Other | Admitting: Family Medicine

## 2016-06-27 VITALS — BP 130/80 | HR 89 | Ht 64.0 in | Wt 146.0 lb

## 2016-06-27 DIAGNOSIS — Z23 Encounter for immunization: Secondary | ICD-10-CM

## 2016-06-27 DIAGNOSIS — Z1211 Encounter for screening for malignant neoplasm of colon: Secondary | ICD-10-CM

## 2016-06-27 DIAGNOSIS — Z0000 Encounter for general adult medical examination without abnormal findings: Secondary | ICD-10-CM

## 2016-06-27 DIAGNOSIS — L299 Pruritus, unspecified: Secondary | ICD-10-CM

## 2016-06-27 LAB — POC HEMOCCULT BLD/STL (OFFICE/1-CARD/DIAGNOSTIC): Fecal Occult Blood, POC: POSITIVE — AB

## 2016-06-27 MED ORDER — HYDROXYZINE HCL 10 MG PO TABS
ORAL_TABLET | ORAL | 4 refills | Status: DC
Start: 2016-06-27 — End: 2018-10-07

## 2016-06-27 NOTE — Assessment & Plan Note (Signed)
After obtaining informed consent, the vaccine is  administered by LPN.  

## 2016-06-27 NOTE — Patient Instructions (Addendum)
Annual wellness May 23 or after, call if you need me before  Flu vaccine and TdAp today  New medication for bedtime use, hydroxyzine, for itching   PLEASE NO MORE GIN and sNUFF, to improve your health  Thank you  for choosing [facility]. We consider it a privelige to serve you.  Delivering excellent health care in a caring and  compassionate way is our goal.  Partnering with you,  so that together we can achieve this goal is our strategy.

## 2016-06-27 NOTE — Assessment & Plan Note (Signed)
Multiple scars and sores in nvarious stages of healing, some open where he is scratching himself. After obtaining informed consent, the vaccine is  administered by LPN.

## 2016-06-27 NOTE — Progress Notes (Signed)
   Alexander Simmons     MRN: [id number]      DOB: September 27, 1955   HPI: Patient is in for annual physical exam. C/o excess generalized itching with scars on limbs and trunk, wants medication for itch, which is worse at night. Recent labs, if available are reviewed. Immunization is reviewed , and  updated if needed.    PE;  BP 130/80   Pulse 89   Ht 5\' 4"  (1.626 m)   Wt 146 lb (66.2 kg)   SpO2 97%   BMI 25.06 kg/m   Pleasant male, alert and oriented x 3, in no cardio-pulmonary distress. Afebrile. HEENT No facial trauma or asymetry. Sinuses non tender. EOMI, . External ears normal, tympanic membranes clear. Oropharynx moist, no exudate. Neck: supple, no adenopathy,JVD or thyromegaly.No bruits.  Chest: Clear to ascultation bilaterally.No crackles or wheezes. Non tender to palpation  Breast: No asymetry,no masses. No nipple discharge or inversion. No axillary or supraclavicular adenopathy  Cardiovascular system; Heart sounds normal,  S1 and  S2 ,no S3.  No murmur, or thrill.Irregular rate Apical beat not displaced Peripheral pulses normal.  Abdomen: Soft, non tender, no organomegaly or masses. No bruits. Bowel sounds normal. No guarding, tenderness or rebound.  Rectal:  Normal sphincter tone.  hemorrhoids present . guaiac positive  stool. Prostate smooth and firm    Musculoskeletal exam: Full ROM of spine, hips , shoulders and knees. No deformity ,swelling or crepitus noted. No muscle wasting or atrophy.   Neurologic: Cranial nerves 2 to 12 intact. Power, tone ,sensation and reflexes normal throughout. No disturbance in gait. No tremor.  Skin: Multiple hyperpigmented scars , from excess scratching  few open lesions, non with bacterial infection normal throughout  Psych; Normal mood and affect. Judgement and concentration normal   Assessment & Plan:  Annual physical exam Annual exam as documented. Counseling done  re healthy lifestyle involving  commitment to 150 minutes exercise per week, heart healthy diet, and attaining healthy weight.The importance of adequate sleep also discussed. Regular seat belt use and home safety, is also discussed. Changes in health habits are decided on by the patient with goals and time frames  set for achieving them. Immunization and cancer screening needs are specifically addressed at this visit.   Need for prophylactic vaccination and inoculation against influenza After obtaining informed consent, the vaccine is  administered by LPN.   Need for Tdap vaccination Multiple scars and sores in nvarious stages of healing, some open where he is scratching himself. After obtaining informed consent, the vaccine is  administered by LPN.   Pruritus Generalized itch, worse at night, start hydroxyzine at bedtime

## 2016-06-27 NOTE — Assessment & Plan Note (Signed)
Generalized itch, worse at night, start hydroxyzine at bedtime

## 2016-06-27 NOTE — Assessment & Plan Note (Signed)

## 2017-01-15 ENCOUNTER — Ambulatory Visit: Payer: Self-pay

## 2017-01-16 ENCOUNTER — Ambulatory Visit: Payer: Self-pay

## 2017-01-22 ENCOUNTER — Ambulatory Visit (INDEPENDENT_AMBULATORY_CARE_PROVIDER_SITE_OTHER): Payer: Medicare Other

## 2017-01-22 VITALS — BP 130/84 | HR 94 | Temp 98.9°F | Ht 63.5 in | Wt 148.0 lb

## 2017-01-22 DIAGNOSIS — Z0000 Encounter for general adult medical examination without abnormal findings: Secondary | ICD-10-CM

## 2017-01-22 NOTE — Progress Notes (Signed)
Subjective:   Alexander Simmons is a 61 y.o. male who presents for Medicare Annual/Subsequent preventive examination.  Review of Systems:  Cardiac Risk Factors include: advanced age (>3men, >36 women);dyslipidemia;hypertension;male gender;smoking/ tobacco exposure;sedentary lifestyle     Objective:    Vitals: BP 130/84   Pulse 94   Temp 98.9 F (37.2 C) (Oral)   Ht 5' 3.5" (1.613 m)   Wt 148 lb 0.6 oz (67.2 kg)   SpO2 98%   BMI 25.81 kg/m   Body mass index is 25.81 kg/m.  Tobacco History  Smoking Status  . Never Smoker  Smokeless Tobacco  . Current User  . Types: Snuff, Chew    Comment: chews tobacco since age 67.     Ready to quit: Yes Counseling given: Yes   Past Medical History:  Diagnosis Date  . Alcohol abuse   . Allergy   . Cor pulmonale (HCC)    By echocardiography  . Glaucoma   . Hyperlipidemia   . Hypertension   . Small bowel obstruction ([place])   . Syncope 12/2009   Attributed to use of narcotics plus alcohol  . Tobacco abuse    Past Surgical History:  Procedure Laterality Date  . COLONOSCOPY N/A 01/28/2013   Procedure: COLONOSCOPY;  Surgeon: Rogene Houston, MD;  Location: [facility];  Service: Endoscopy;  Laterality: N/A;  1225  . PILONIDAL CYST EXCISION  2005   Dr. Irving Shows  . TOOTH EXTRACTION     Family History  Problem Relation Age of Onset  . Other Unknown        FH unknown-patient raised in foster care system  . Colon cancer Neg Hx    History  Sexual Activity  . Sexual activity: Not Currently    Outpatient Encounter Prescriptions as of 01/22/2017  Medication Sig  . aspirin 81 MG tablet Take 1 tablet (81 mg total) by mouth daily.  . hydrOXYzine (ATARAX/VISTARIL) 10 MG tablet One tablet at bedtime, for itching (Patient taking differently: Take 10 mg by mouth daily as needed for itching. One tablet at bedtime, for itching)  . lisinopril (PRINIVIL,ZESTRIL) 10 MG tablet Take 1 tablet (10 mg total) by mouth daily.  . metoprolol  tartrate (LOPRESSOR) 25 MG tablet Take 1 tablet (25 mg total) by mouth 2 (two) times daily.   No facility-administered encounter medications on file as of 01/22/2017.     Activities of Daily Living In your present state of health, do you have any difficulty performing the following activities: 01/22/2017  Hearing? N  Vision? N  Difficulty concentrating or making decisions? N  Walking or climbing stairs? N  Dressing or bathing? N  Doing errands, shopping? Y  Preparing Food and eating ? N  Using the Toilet? N  In the past six months, have you accidently leaked urine? N  Do you have problems with loss of bowel control? N  Managing your Medications? N  Managing your Finances? N  Housekeeping or managing your Housekeeping? N  Some recent data might be hidden    Patient Care Team: Fayrene Helper, MD as PCP - General (Family Medicine) Lattie Haw, Cristopher Estimable, MD as Attending Physician (Cardiology)   Assessment:    Exercise Activities and Dietary recommendations Current Exercise Habits: Home exercise routine, Type of exercise: Other - see comments (push ups), Time (Minutes): 20, Frequency (Times/Week): 5, Weekly Exercise (Minutes/Week): 100, Intensity: Mild  Goals    . Quit using tobacco      Fall Risk Fall Risk  01/22/2017 06/01/2014 04/06/2014  Falls in the past year? No No No   Depression Screen PHQ 2/9 Scores 01/22/2017 04/06/2014  PHQ - 2 Score 0 0   Cognitive Function: Normal   6CIT Screen 01/22/2017  What Year? 0 points  What month? 0 points  What time? 0 points  Count back from 20 0 points  Months in reverse 0 points  Repeat phrase 0 points  Total Score 0    Immunization History  Administered Date(s) Administered  . Influenza Whole 08/19/2007, 06/05/2010  . Influenza,inj,Quad PF,36+ Mos 06/01/2014, 06/27/2016  . Pneumococcal Conjugate-13 04/06/2014  . Td 02/08/2004  . Tdap 06/27/2016   Screening Tests Health Maintenance  Topic Date Due  . INFLUENZA VACCINE   03/26/2017  . COLONOSCOPY  01/29/2023  . TETANUS/TDAP  06/27/2026  . Hepatitis C Screening  Completed  . HIV Screening  Completed      Plan:   I have personally reviewed and noted the following in the patient's chart:   . Medical and social history . Use of alcohol, tobacco or illicit drugs  . Current medications and supplements . Functional ability and status . Nutritional status . Physical activity . Advanced directives . List of other physicians . Hospitalizations, surgeries, and ER visits in previous 12 months . Vitals . Screenings to include cognitive, depression, and falls . Referrals and appointments  In addition, I have reviewed and discussed with patient certain preventive protocols, quality metrics, and best practice recommendations. A written personalized care plan for preventive services as well as general preventive health recommendations were provided to patient.     Stormy Fabian, LPN  [id number]

## 2017-01-22 NOTE — Patient Instructions (Addendum)
Alexander Simmons , Thank you for taking time to come for your Medicare Wellness Visit. I appreciate your ongoing commitment to your health goals. Please review the following plan we discussed and let me know if I can assist you in the future.   Screening recommendations/referrals: Colonoscopy: Up to date, next due 01/2023 Recommended yearly ophthalmology/optometry visit for glaucoma screening and checkup Recommended yearly dental visit for hygiene and checkup  Vaccinations: Influenza vaccine: Up to date, next due 06/2017 Pneumococcal vaccine: Up to date, next due at age 6 Tdap vaccine: Up to date, next due 06/2026 Shingles vaccine: Due    Advanced directives: Advance directive discussed with you today. I have provided a copy for you to complete at home and have notarized. Once this is complete please bring a copy in to our office so we can scan it into your chart.  Conditions/risks identified: Quit drinking  Next appointment: Follow up in 1 month with Dr. Moshe Cipro. Follow up in 1 year for your annual wellness visit.  Preventive Care 40-64 Years, Male Preventive care refers to lifestyle choices and visits with your health care provider that can promote health and wellness. What does preventive care include?  A yearly physical exam. This is also called an annual well check.  Dental exams once or twice a year.  Routine eye exams. Ask your health care provider how often you should have your eyes checked.  Personal lifestyle choices, including:  Daily care of your teeth and gums.  Regular physical activity.  Eating a healthy diet.  Avoiding tobacco and drug use.  Limiting alcohol use.  Practicing safe sex.  Taking low-dose aspirin every day starting at age 51. What happens during an annual well check? The services and screenings done by your health care provider during your annual well check will depend on your age, overall health, lifestyle risk factors, and family history of  disease. Counseling  Your health care provider may ask you questions about your:  Alcohol use.  Tobacco use.  Drug use.  Emotional well-being.  Home and relationship well-being.  Sexual activity.  Eating habits.  Work and work Statistician. Screening  You may have the following tests or measurements:  Height, weight, and BMI.  Blood pressure.  Lipid and cholesterol levels. These may be checked every 5 years, or more frequently if you are over 75 years old.  Skin check.  Lung cancer screening. You may have this screening every year starting at age 35 if you have a 30-pack-year history of smoking and currently smoke or have quit within the past 15 years.  Fecal occult blood test (FOBT) of the stool. You may have this test every year starting at age 64.  Flexible sigmoidoscopy or colonoscopy. You may have a sigmoidoscopy every 5 years or a colonoscopy every 10 years starting at age 30.  Prostate cancer screening. Recommendations will vary depending on your family history and other risks.  Hepatitis C blood test.  Hepatitis B blood test.  Sexually transmitted disease (STD) testing.  Diabetes screening. This is done by checking your blood sugar (glucose) after you have not eaten for a while (fasting). You may have this done every 1-3 years. Discuss your test results, treatment options, and if necessary, the need for more tests with your health care provider. Vaccines  Your health care provider may recommend certain vaccines, such as:  Influenza vaccine. This is recommended every year.  Tetanus, diphtheria, and acellular pertussis (Tdap, Td) vaccine. You may need a Td booster every  10 years.  Zoster vaccine. You may need this after age 51.  Pneumococcal 13-valent conjugate (PCV13) vaccine. You may need this if you have certain conditions and have not been vaccinated.  Pneumococcal polysaccharide (PPSV23) vaccine. You may need one or two doses if you smoke cigarettes  or if you have certain conditions. Talk to your health care provider about which screenings and vaccines you need and how often you need them. This information is not intended to replace advice given to you by your health care provider. Make sure you discuss any questions you have with your health care provider. Document Released: 09/08/2015 Document Revised: 05/01/2016 Document Reviewed: 06/13/2015 Elsevier Interactive Patient Education  2017 [place] Prevention in the Home Falls can cause injuries. They can happen to people of all ages. There are many things you can do to make your home safe and to help prevent falls. What can I do on the outside of my home?  Regularly fix the edges of walkways and driveways and fix any cracks.  Remove anything that might make you trip as you walk through a door, such as a raised step or threshold.  Trim any bushes or trees on the path to your home.  Use bright outdoor lighting.  Clear any walking paths of anything that might make someone trip, such as rocks or tools.  Regularly check to see if handrails are loose or broken. Make sure that both sides of any steps have handrails.  Any raised decks and porches should have guardrails on the edges.  Have any leaves, snow, or ice cleared regularly.  Use sand or salt on walking paths during winter.  Clean up any spills in your garage right away. This includes oil or grease spills. What can I do in the bathroom?  Use night lights.  Install grab bars by the toilet and in the tub and shower. Do not use towel bars as grab bars.  Use non-skid mats or decals in the tub or shower.  If you need to sit down in the shower, use a plastic, non-slip stool.  Keep the floor dry. Clean up any water that spills on the floor as soon as it happens.  Remove soap buildup in the tub or shower regularly.  Attach bath mats securely with double-sided non-slip rug tape.  Do not have throw rugs and other  things on the floor that can make you trip. What can I do in the bedroom?  Use night lights.  Make sure that you have a light by your bed that is easy to reach.  Do not use any sheets or blankets that are too big for your bed. They should not hang down onto the floor.  Have a firm chair that has side arms. You can use this for support while you get dressed.  Do not have throw rugs and other things on the floor that can make you trip. What can I do in the kitchen?  Clean up any spills right away.  Avoid walking on wet floors.  Keep items that you use a lot in easy-to-reach places.  If you need to reach something above you, use a strong step stool that has a grab bar.  Keep electrical cords out of the way.  Do not use floor polish or wax that makes floors slippery. If you must use wax, use non-skid floor wax.  Do not have throw rugs and other things on the floor that can make you trip. What can  I do with my stairs?  Do not leave any items on the stairs.  Make sure that there are handrails on both sides of the stairs and use them. Fix handrails that are broken or loose. Make sure that handrails are as long as the stairways.  Check any carpeting to make sure that it is firmly attached to the stairs. Fix any carpet that is loose or worn.  Avoid having throw rugs at the top or bottom of the stairs. If you do have throw rugs, attach them to the floor with carpet tape.  Make sure that you have a light switch at the top of the stairs and the bottom of the stairs. If you do not have them, ask someone to add them for you. What else can I do to help prevent falls?  Wear shoes that:  Do not have high heels.  Have rubber bottoms.  Are comfortable and fit you well.  Are closed at the toe. Do not wear sandals.  If you use a stepladder:  Make sure that it is fully opened. Do not climb a closed stepladder.  Make sure that both sides of the stepladder are locked into place.  Ask  someone to hold it for you, if possible.  Clearly mark and make sure that you can see:  Any grab bars or handrails.  First and last steps.  Where the edge of each step is.  Use tools that help you move around (mobility aids) if they are needed. These include:  Canes.  Walkers.  Scooters.  Crutches.  Turn on the lights when you go into a dark area. Replace any light bulbs as soon as they burn out.  Set up your furniture so you have a clear path. Avoid moving your furniture around.  If any of your floors are uneven, fix them.  If there are any pets around you, be aware of where they are.  Review your medicines with your doctor. Some medicines can make you feel dizzy. This can increase your chance of falling. Ask your doctor what other things that you can do to help prevent falls. This information is not intended to replace advice given to you by your health care provider. Make sure you discuss any questions you have with your health care provider. Document Released: 06/08/2009 Document Revised: 01/18/2016 Document Reviewed: 09/16/2014 Elsevier Interactive Patient Education  2017 [place].   What You Need to Know About Smokeless Tobacco Use Tobacco use is one of the leading causes of cancer and other chronic health problems. Smokeless tobacco is tobacco that is put directly into the mouth instead of being smoked. It may also be called chewing tobacco or snuff. Smokeless tobacco is made from the leaves of tobacco plants and it comes in several forms:  Loose, dry leaves, plugs, or twists.  Moist pouches.  Dissolving lozenges or strips. Chewing, sucking, or holding the tobacco in your mouth causes your mouth to make more saliva. The saliva mixes with the tobacco to make "tobacco juice" that is swallowed or spit out. How can smokeless tobacco affect me? Using smokeless tobacco:  Increases your risk of developing cancer. Smokeless tobacco contains at least 28 different  types of cancer-causing chemicals (carcinogens).  Increases your chances of developing other long-term health problems, including high blood pressure, heart disease, stroke, and dental problems.  Can make you become addicted. Nicotine is one of the chemicals in tobacco. When you chew tobacco, you absorb nicotine from the tobacco juice. This can  make you feel more alert than usual.  Can cause problems with pregnancy. Pregnant women who use smokeless tobacco are more likely to miscarry or deliver a baby too early (premature delivery).  Can affect the appearance and health of your mouth. Using smokeless tobacco may cause bad breath, yellow-brown teeth, mouth sores, cracking and bleeding lips, gum recession, and lesions on the soft tissues of your mouth (leukoplakia). What are the benefits of not using smokeless tobacco? The benefits of not using smokeless tobacco include:  A healthy mind because:  You avoid addiction.  A healthy body because:  You avoid dental problems.  You promote healthy pregnancy.  You avoid long-term health problems.  A healthy wallet because:  You avoid costs of buying tobacco.  You avoid health care costs in the future.  A healthy family because:  You avoid accidental poisoning of children in your household. What can happen if I continue to use smokeless tobacco? If you continue to use smokeless tobacco, you will increase your risk for developing certain cancers. These include:  Tongue.  Lips, mouth, and gums.  Throat (esophagus) and voice box (larynx).  Stomach.  Pancreas.  Bladder.  Colon. Long-term use of smokeless tobacco can also lead to:  High blood pressure, heart disease, and stroke.  Gum disease, gum recession, and bone loss around the teeth.  Tooth decay. How do I quit using smokeless tobacco? Quitting the use of smokeless tobacco can be hard, but it can be done. Follow these steps:  Pick a date to quit. Set a date within the  next two weeks. This gives you time to prepare.  Write down the reasons why you are quitting. Keep this list in places where you will see it often, such as on your bathroom mirror or in your car or wallet.  Identify the people, places, things, and activities that make you want to use tobacco (triggers) and avoid them.  Get rid of any tobacco you have and remove any tobacco smells. To do this:  Throw away all containers of tobacco at home, at work, and in your car.  Throw away any other items that you use regularly when you chew tobacco.  Clean your car and make sure to remove all tobacco-related items.  Clean your home, including curtains and carpets.  Tell your family, friends, and coworkers that you are quitting. This can make quitting easier.  Ask your health care provider for help quitting smokeless tobacco. This may involve treatment. Find out what treatment options are covered by your health insurance.  Keep track of how many days have passed since you quit. Remembering how long and hard you have worked to quit can help you avoid using tobacco again. Where can I get support? Ask your health care provider if there is a local support group for quitting smokeless tobacco. Where can I get more information? You can learn more about the risks of using smokeless tobacco and the benefits of quitting from these sources:  [place]: www.cancer.gov  American Cancer Society: www.cancer.org When should I seek medical care? Seek medical care if you have:  White or other discolored patches in your mouth.  Difficulty swallowing.  A change in your voice.  Unexplained weight loss.  Stomach pain, nausea, or vomiting. Summary  Smokeless tobacco contains at least 28 different chemicals that are known to cause cancer (carcinogen).  Nicotine is an addictive chemical in smokeless tobacco.  When you quit using smokeless tobacco, you lower your risk of developing  cancer. This information is not intended to replace advice given to you by your health care provider. Make sure you discuss any questions you have with your health care provider. Document Released: 01/14/2011 Document Revised: 04/06/2016 Document Reviewed: 03/24/2015 Elsevier Interactive Patient Education  2017 [employer].

## 2017-02-25 ENCOUNTER — Ambulatory Visit: Payer: Self-pay | Admitting: Family Medicine

## 2017-02-25 ENCOUNTER — Encounter: Payer: Self-pay | Admitting: Family Medicine

## 2018-07-20 ENCOUNTER — Encounter: Payer: Self-pay | Admitting: *Deleted

## 2018-08-06 ENCOUNTER — Encounter: Payer: Self-pay | Admitting: *Deleted

## 2018-08-26 ENCOUNTER — Encounter (HOSPITAL_COMMUNITY): Payer: Self-pay | Admitting: Emergency Medicine

## 2018-08-26 ENCOUNTER — Emergency Department (HOSPITAL_COMMUNITY): Payer: Medicare Other

## 2018-08-26 ENCOUNTER — Emergency Department (HOSPITAL_COMMUNITY)
Admission: EM | Admit: 2018-08-26 | Discharge: 2018-08-26 | Disposition: A | Discharge status: Home / Self Care | Payer: Medicare Other | Attending: Emergency Medicine | Admitting: Emergency Medicine

## 2018-08-26 DIAGNOSIS — F17220 Nicotine dependence, chewing tobacco, uncomplicated: Secondary | ICD-10-CM | POA: Insufficient documentation

## 2018-08-26 DIAGNOSIS — K4090 Unilateral inguinal hernia, without obstruction or gangrene, not specified as recurrent: Secondary | ICD-10-CM | POA: Insufficient documentation

## 2018-08-26 DIAGNOSIS — R109 Unspecified abdominal pain: Secondary | ICD-10-CM

## 2018-08-26 DIAGNOSIS — R0789 Other chest pain: Secondary | ICD-10-CM

## 2018-08-26 DIAGNOSIS — K449 Diaphragmatic hernia without obstruction or gangrene: Secondary | ICD-10-CM | POA: Diagnosis not present

## 2018-08-26 DIAGNOSIS — N200 Calculus of kidney: Secondary | ICD-10-CM | POA: Diagnosis not present

## 2018-08-26 DIAGNOSIS — R1084 Generalized abdominal pain: Secondary | ICD-10-CM | POA: Diagnosis present

## 2018-08-26 DIAGNOSIS — I10 Essential (primary) hypertension: Secondary | ICD-10-CM | POA: Insufficient documentation

## 2018-08-26 DIAGNOSIS — Z79899 Other long term (current) drug therapy: Secondary | ICD-10-CM | POA: Insufficient documentation

## 2018-08-26 LAB — COMPREHENSIVE METABOLIC PANEL
ALBUMIN: 3.7 g/dL (ref 3.5–5.0)
ALK PHOS: 51 U/L (ref 38–126)
ALT: 13 U/L (ref 0–44)
AST: 22 U/L (ref 15–41)
Anion gap: 8 (ref 5–15)
BUN: 12 mg/dL (ref 8–23)
CALCIUM: 8.5 mg/dL — AB (ref 8.9–10.3)
CO2: 25 mmol/L (ref 22–32)
CREATININE: 0.84 mg/dL (ref 0.61–1.24)
Chloride: 109 mmol/L (ref 98–111)
GFR calc Af Amer: >60 mL/min (ref >60)
GFR calc non Af Amer: >60 mL/min (ref >60)
Glucose, Bld: 114 mg/dL — ABNORMAL HIGH (ref 70–99)
Potassium: 3.9 mmol/L (ref 3.5–5.1)
SODIUM: 142 mmol/L (ref 135–145)
Total Bilirubin: 1.2 mg/dL (ref 0.3–1.2)
Total Protein: 7.1 g/dL (ref 6.5–8.1)

## 2018-08-26 LAB — CBC
HCT: 38.7 % — ABNORMAL LOW (ref 39.0–52.0)
HEMOGLOBIN: 12.7 g/dL — AB (ref 13.0–17.0)
MCH: 33.7 pg (ref 26.0–34.0)
MCHC: 32.8 g/dL (ref 30.0–36.0)
MCV: 102.7 fL — ABNORMAL HIGH (ref 80.0–100.0)
Platelets: 269 10*3/uL (ref 150–400)
RBC: 3.77 MIL/uL — AB (ref 4.22–5.81)
RDW: 13.6 % (ref 11.5–15.5)
WBC: 3.2 10*3/uL — ABNORMAL LOW (ref 4.0–10.5)
nRBC: 0 % (ref 0.0–0.2)

## 2018-08-26 LAB — LIPASE, BLOOD: Lipase: 24 U/L (ref 11–51)

## 2018-08-26 MED ORDER — HYDROCODONE-ACETAMINOPHEN 5-325 MG PO TABS: 1.0000 | ORAL_TABLET | Freq: Four times a day (QID) | ORAL | 0 refills | Status: DC | PRN

## 2018-08-26 NOTE — Discharge Instructions (Signed)
Take the hydrocodone as needed for the pain.  Work-up here today without evidence of any kidney stones in the tube between your kidney and bladder.  Chest x-ray also negative for any problems they are or any obvious rib fractures.  Symptoms seem to be musculoskeletal in nature.  Also for the right inguinal hernia recommend follow-up with general surgery.  Referral information provided above make a phone call to follow-up with them they can evaluate this hernia for repair.

## 2018-08-26 NOTE — ED Triage Notes (Signed)
Pt states he woke up with right flank pain this morning moving down towards groin.

## 2018-08-26 NOTE — ED Provider Notes (Signed)
[facility] EMERGENCY DEPARTMENT Provider Note   CSN: [id number] Arrival date & time: 08/26/18  1111     History   Chief Complaint Chief Complaint  Patient presents with  . Flank Pain    HPI Alexander Simmons is a 63 y.o. male.  Patient presenting with a complaint of right flank pain.  Worse this morning but started last evening.  No nausea or vomiting no fevers no blood in the urine no discomfort with urination.  Pain made worse by movement.  Also has some tenderness over his lower ribs on that side.  No central chest pain no posterior chest pain no shortness of breath.  No history of any trauma or injury.  No history of kidney stones in the past.  Seeing made comment that the pain radiated into the groin area but patient denied that to me.     Past Medical History:  Diagnosis Date  . Alcohol abuse   . Allergy   . Cor pulmonale (HCC)    By echocardiography  . Glaucoma   . Hyperlipidemia   . Hypertension   . Small bowel obstruction ([place])   . Syncope 12/2009   Attributed to use of narcotics plus alcohol  . Tobacco abuse     Patient Active Problem List   Diagnosis Date Noted  . Pruritus 06/27/2016  . Overweight (BMI 25.0-29.9) 01/21/2016  . Annual physical exam 10/12/2014  . Need for prophylactic vaccination and inoculation against influenza 06/01/2014  . Hypertension, essential 04/08/2014  . Alcoholic cardiomyopathy ([place]) 04/08/2014  . Need for Tdap vaccination 04/06/2014  . Hyperlipidemia   . Tobacco abuse   . Palpitations 01/05/2013  . Alcohol dependence ([place]) 02/21/2010  . MENTAL RETARDATION, MILD 01/12/2008    Past Surgical History:  Procedure Laterality Date  . ABDOMINAL SURGERY    . COLONOSCOPY N/A 01/28/2013   Procedure: COLONOSCOPY;  Surgeon: Rogene Houston, MD;  Location: [facility];  Service: Endoscopy;  Laterality: N/A;  1225  . PILONIDAL CYST EXCISION  2005   Dr. Irving Shows  . TOOTH EXTRACTION          Home Medications    Prior to  Admission medications   Medication Sig Start Date End Date Taking? Authorizing Provider  HYDROcodone-acetaminophen (NORCO/VICODIN) 5-325 MG tablet Take 1 tablet by mouth every 6 (six) hours as needed. 08/26/18   Fredia Sorrow, MD  hydrOXYzine (ATARAX/VISTARIL) 10 MG tablet One tablet at bedtime, for itching Patient not taking: Reported on 08/26/2018 06/27/16   Fayrene Helper, MD  lisinopril (PRINIVIL,ZESTRIL) 10 MG tablet Take 1 tablet (10 mg total) by mouth daily. Patient not taking: Reported on 08/26/2018 10/20/14   Herminio Commons, MD  metoprolol tartrate (LOPRESSOR) 25 MG tablet Take 1 tablet (25 mg total) by mouth 2 (two) times daily. Patient not taking: Reported on 08/26/2018 06/01/14   Fayrene Helper, MD    Family History Family History  Problem Relation Age of Onset  . Other Other        FH unknown-patient raised in foster care system  . Colon cancer Neg Hx     Social History Social History   Tobacco Use  . Smoking status: Never Smoker  . Smokeless tobacco: Current User    Types: Snuff, Chew  . Tobacco comment: chews tobacco since age 89.  Substance Use Topics  . Alcohol use: Yes    Alcohol/week: 20.0 standard drinks    Types: 20 Shots of liquor per week    Comment:  1 pint of gin daily (4cups daily)  . Drug use: No     Allergies   Patient has no known allergies.   Review of Systems Review of Systems  Constitutional: Negative for fever.  HENT: Negative for congestion.   Eyes: Negative for visual disturbance.  Respiratory: Negative for shortness of breath.   Cardiovascular: Negative for chest pain.  Gastrointestinal: Negative for abdominal pain, nausea and vomiting.  Genitourinary: Positive for flank pain. Negative for dysuria, hematuria and scrotal swelling.  Musculoskeletal: Negative for back pain.  Skin: Negative for rash.  Neurological: Negative for syncope.  Hematological: Does not bruise/bleed easily.  Psychiatric/Behavioral: Negative for  confusion.     Physical Exam Updated Vital Signs BP (!) 142/75 (BP Location: Right Arm)   Pulse 91   Temp 97.7 F (36.5 C) (Oral)   Resp 18   Ht 1.651 m (5\' 5" )   Wt 65.7 kg   SpO2 100%   BMI 24.11 kg/m   Physical Exam Vitals signs and nursing note reviewed.  Constitutional:      General: He is not in acute distress.    Appearance: Normal appearance.  HENT:     Head: Normocephalic and atraumatic.     Mouth/Throat:     Mouth: Mucous membranes are moist.     Comments: Poor dentition. Eyes:     Extraocular Movements: Extraocular movements intact.     Conjunctiva/sclera: Conjunctivae normal.     Pupils: Pupils are equal, round, and reactive to light.  Neck:     Musculoskeletal: Normal range of motion and neck supple.  Cardiovascular:     Rate and Rhythm: Normal rate and regular rhythm.  Pulmonary:     Effort: Pulmonary effort is normal.     Breath sounds: Normal breath sounds.     Comments: Tenderness to palpation along the right lateral lower ribs.  Also some discomfort to palpation along the right flank area.  No anterior abdominal tenderness. Chest:     Chest wall: Tenderness present.  Abdominal:     General: Bowel sounds are normal.     Palpations: Abdomen is soft.     Tenderness: There is abdominal tenderness.     Comments: Some tenderness palpation along the right flank area no anterior abdominal tenderness.  No guarding.  No CVA tenderness.  Genitourinary:    Comments: Right-sided groin hernia easily reducible.  Nontender. Musculoskeletal: Normal range of motion.  Skin:    General: Skin is warm.  Neurological:     General: No focal deficit present.     Mental Status: He is alert and oriented to person, place, and time.      ED Treatments / Results  Labs (all labs ordered are listed, but only abnormal results are displayed) Labs Reviewed  CBC - Abnormal; Notable for the following components:      Result Value   WBC 3.2 (*)    RBC 3.77 (*)     Hemoglobin 12.7 (*)    HCT 38.7 (*)    MCV 102.7 (*)    All other components within normal limits  COMPREHENSIVE METABOLIC PANEL - Abnormal; Notable for the following components:   Glucose, Bld 114 (*)    Calcium 8.5 (*)    All other components within normal limits  LIPASE, BLOOD  URINALYSIS, ROUTINE W REFLEX MICROSCOPIC    EKG None  Radiology Dg Ribs Unilateral W/chest Right  Result Date: 08/26/2018 CLINICAL DATA:  63 year old male with history of right posterior back pain radiating toward  the front this morning. EXAM: RIGHT RIBS AND CHEST - 3+ VIEW COMPARISON:  Chest x-ray 01/09/2010. FINDINGS: Lung volumes are normal. No consolidative airspace disease. No pleural effusions. No pneumothorax. No pulmonary nodule or mass noted. Moderate-sized hiatal hernia. Pulmonary vasculature and the cardiomediastinal silhouette are otherwise within normal limits. Dedicated views of the right ribs demonstrate no acute displaced right-sided rib fractures. IMPRESSION: 1. No acute displaced right-sided rib fractures. 2. No acute findings in the thorax to account for the patient's symptoms. 3. Moderate sized hiatal hernia. Electronically Signed   By: Vinnie Langton M.D.   On: 08/26/2018 12:43   Ct Renal Stone Study  Result Date: 08/26/2018 CLINICAL DATA:  Acute right flank pain. EXAM: CT ABDOMEN AND PELVIS WITHOUT CONTRAST TECHNIQUE: Multidetector CT imaging of the abdomen and pelvis was performed following the standard protocol without IV contrast. COMPARISON:  None. FINDINGS: Lower chest: No acute abnormality. Hepatobiliary: No focal liver abnormality is seen. No gallstones, gallbladder wall thickening, or biliary dilatation. Pancreas: Unremarkable. No pancreatic ductal dilatation or surrounding inflammatory changes. Spleen: Normal in size without focal abnormality. Adrenals/Urinary Tract: Adrenal glands appear normal. Small nonobstructive right renal calculi are noted. No hydronephrosis or renal obstruction  is noted. Urinary bladder is unremarkable. Stomach/Bowel: Stomach is within normal limits. Appendix appears normal. No evidence of bowel wall thickening, distention, or inflammatory changes. Vascular/Lymphatic: No significant vascular findings are present. No enlarged abdominal or pelvic lymph nodes. Reproductive: Prostate is unremarkable. Other: No ascites is noted. Moderate size fat containing right inguinal hernia is noted. Musculoskeletal: No acute or significant osseous findings. IMPRESSION: Nonobstructive right nephrolithiasis. No hydronephrosis or renal obstruction is noted. Moderate size fat containing right inguinal hernia. Electronically Signed   By: Marijo Conception, M.D.   On: 08/26/2018 13:00    Procedures Procedures (including critical care time)  Medications Ordered in ED Medications - No data to display   Initial Impression / Assessment and Plan / ED Course  I have reviewed the triage vital signs and the nursing notes.  Pertinent labs & imaging results that were available during my care of the patient were reviewed by me and considered in my medical decision making (see chart for details).    Discussed with patient about the urinalysis.  His ride needs to take him home because the need to go to work.  So patient does not want to provide a urine sample at this time.  Patient's renal function was normal.  CT scan without any acute findings consistent with ureteral stone or bladder infection.  Patient does have evidence of some stones up in the kidneys.  But no evidence of any recent passage of them.  Chest x-ray and rib series without any acute abnormalities.  No evidence of rib fracture.  No underlying pneumonia on the right lower side.  Patient's labs without any significant findings.  No leukocytosis.  Patient does have a right inguinal hernia that is easily reducible.  Will refer to general surgery for this.  We will treat him symptomatically for the flank pain which seems to be  musculoskeletal in nature.  He will follow-up with his primary care doctor as well.  Patient nontoxic no acute distress.   Final Clinical Impressions(s) / ED Diagnoses   Final diagnoses:  Right flank pain  Chest wall pain  Non-recurrent unilateral inguinal hernia without obstruction or gangrene    ED Discharge Orders         Ordered    HYDROcodone-acetaminophen (NORCO/VICODIN) 5-325 MG tablet  Every  6 hours PRN     08/26/18 1434           Fredia Sorrow, MD 08/26/18 1450

## 2018-09-23 ENCOUNTER — Ambulatory Visit (INDEPENDENT_AMBULATORY_CARE_PROVIDER_SITE_OTHER): Payer: Medicare Other

## 2018-09-23 VITALS — BP 161/95 | HR 70 | Resp 10 | Ht 61.0 in | Wt 148.0 lb

## 2018-09-23 DIAGNOSIS — Z0000 Encounter for general adult medical examination without abnormal findings: Secondary | ICD-10-CM

## 2018-09-23 DIAGNOSIS — Z23 Encounter for immunization: Secondary | ICD-10-CM

## 2018-09-23 NOTE — Progress Notes (Signed)
Subjective:   KARNELL VANDERLOOP is a 63 y.o. male who presents for Medicare Annual/Subsequent preventive examination.  Review of Systems:   Cardiac Risk Factors include: smoking/ tobacco exposure;male gender     Objective:    Vitals: BP (!) 161/95   Pulse 70   Resp 10   Ht 5\' 1"  (1.549 m)   Wt 148 lb (67.1 kg)   SpO2 98%   BMI 27.96 kg/m   Body mass index is 27.96 kg/m.  Advanced Directives 09/23/2018 08/26/2018 01/22/2017 01/28/2013  Does Patient Have a Medical Advance Directive? No No No Patient does not have advance directive;Patient would not like information  Would patient like information on creating a medical advance directive? No - Patient declined No - Patient declined Yes (MAU/Ambulatory/Procedural Areas - Information given) -  Pre-existing out of facility DNR order (yellow form or pink MOST form) - - - No    Tobacco Social History   Tobacco Use  Smoking Status Never Smoker  Smokeless Tobacco Current User  . Types: Snuff, Chew  Tobacco Comment   chews tobacco since age 18.     Ready to quit: No Counseling given: Not Answered Comment: chews tobacco since age 57.   Clinical Intake:  Pre-visit preparation completed: Yes  Pain : 0-10 Pain Score: 1  Pain Type: Acute pain Pain Location: Flank Pain Orientation: Right Pain Descriptors / Indicators: Aching Pain Onset: 1 to 4 weeks ago Pain Frequency: Intermittent Pain Relieving Factors: none  Pain Relieving Factors: none  BMI - recorded: 27.96 Nutritional Status: BMI 25 -29 Overweight Nutritional Risks: None Diabetes: No  How often do you need to have someone help you when you read instructions, pamphlets, or other written materials from your doctor or pharmacy?: 4 - Often What is the last grade level you completed in school?: 12 grade   Interpreter Needed?: No  Information entered by :: Francena Hanly LPN   Past Medical History:  Diagnosis Date  . Alcohol abuse   . Allergy   . Cor pulmonale (HCC)    By echocardiography  . Glaucoma   . Hyperlipidemia   . Hypertension   . Small bowel obstruction ([place])   . Syncope 12/2009   Attributed to use of narcotics plus alcohol  . Tobacco abuse    Past Surgical History:  Procedure Laterality Date  . ABDOMINAL SURGERY    . COLONOSCOPY N/A 01/28/2013   Procedure: COLONOSCOPY;  Surgeon: Rogene Houston, MD;  Location: [facility];  Service: Endoscopy;  Laterality: N/A;  1225  . PILONIDAL CYST EXCISION  2005   Dr. Irving Shows  . TOOTH EXTRACTION     Family History  Problem Relation Age of Onset  . Other Other        FH unknown-patient raised in foster care system  . Colon cancer Neg Hx    Social History   Socioeconomic History  . Marital status: Single    Spouse name: Not on file  . Number of children: 1  . Years of education: 62  . Highest education level: 12th grade  Occupational History  . Occupation: disabled   Social Needs  . Financial resource strain: Somewhat hard  . Food insecurity:    Worry: Never true    Inability: Never true  . Transportation needs:    Medical: Yes    Non-medical: Yes  Tobacco Use  . Smoking status: Never Smoker  . Smokeless tobacco: Current User    Types: Snuff, Chew  . Tobacco  comment: chews tobacco since age 70.  Substance and Sexual Activity  . Alcohol use: Yes    Alcohol/week: 20.0 standard drinks    Types: 20 Shots of liquor per week    Comment: 1 pint of gin daily (4cups daily)  . Drug use: No  . Sexual activity: Not Currently  Lifestyle  . Physical activity:    Days per week: 7 days    Minutes per session: 30 min  . Stress: Only a little  Relationships  . Social connections:    Talks on phone: More than three times a week    Gets together: More than three times a week    Attends religious service: Never    Active member of club or organization: No    Attends meetings of clubs or organizations: Not on file    Relationship status: Never married  Other Topics Concern  . Not on file   Social History Narrative   Lives with his niece     Outpatient Encounter Medications as of 09/23/2018  Medication Sig  . HYDROcodone-acetaminophen (NORCO/VICODIN) 5-325 MG tablet Take 1 tablet by mouth every 6 (six) hours as needed. (Patient not taking: Reported on 09/23/2018)  . hydrOXYzine (ATARAX/VISTARIL) 10 MG tablet One tablet at bedtime, for itching (Patient not taking: Reported on 08/26/2018)  . lisinopril (PRINIVIL,ZESTRIL) 10 MG tablet Take 1 tablet (10 mg total) by mouth daily. (Patient not taking: Reported on 08/26/2018)  . metoprolol tartrate (LOPRESSOR) 25 MG tablet Take 1 tablet (25 mg total) by mouth 2 (two) times daily. (Patient not taking: Reported on 08/26/2018)   No facility-administered encounter medications on file as of 09/23/2018.     Activities of Daily Living In your present state of health, do you have any difficulty performing the following activities: 09/23/2018  Hearing? N  Vision? N  Difficulty concentrating or making decisions? Y  Walking or climbing stairs? N  Dressing or bathing? N  Doing errands, shopping? N  Preparing Food and eating ? N  Using the Toilet? N  In the past six months, have you accidently leaked urine? N  Do you have problems with loss of bowel control? N  Managing your Medications? Y  Managing your Finances? Y  Housekeeping or managing your Housekeeping? Y  Some recent data might be hidden    Patient Care Team: Fayrene Helper, MD as PCP - General (Family Medicine) Lattie Haw, Cristopher Estimable, MD as Attending Physician (Cardiology)   Assessment:   This is a routine wellness examination for Uri.  Exercise Activities and Dietary recommendations Current Exercise Habits: Home exercise routine, Type of exercise: walking, Time (Minutes): 30, Frequency (Times/Week): 7, Weekly Exercise (Minutes/Week): 210, Intensity: Mild  Goals    . Quit using tobacco       Fall Risk Fall Risk  09/23/2018 01/22/2017 06/01/2014 04/06/2014  Falls in the past  year? 0 No No No  Number falls in past yr: 0 - - -  Injury with Fall? 0 - - -   Is the patient's home free of loose throw rugs in walkways, pet beds, electrical cords, etc?   no      Grab bars in the bathroom? no      Handrails on the stairs?   yes      Adequate lighting?   yes  Timed Get Up and Go Performed: Patient able to perform in 6 seconds without assistance   Depression Screen PHQ 2/9 Scores 09/23/2018 01/22/2017 04/06/2014  PHQ - 2 Score 0 0  0    Cognitive Function     6CIT Screen 09/23/2018 01/22/2017  What Year? 0 points 0 points  What month? 0 points 0 points  What time? 0 points 0 points  Count back from 20 0 points 0 points  Months in reverse 4 points 0 points  Repeat phrase 0 points 0 points  Total Score 4 0    Immunization History  Administered Date(s) Administered  . Influenza Whole 08/19/2007, 06/05/2010  . Influenza,inj,Quad PF,6+ Mos 06/01/2014, 06/27/2016  . Pneumococcal Conjugate-13 04/06/2014  . Td 02/08/2004  . Tdap 06/27/2016    Qualifies for Shingles Vaccine? Postponed   Screening Tests Health Maintenance  Topic Date Due  . INFLUENZA VACCINE  03/26/2018  . COLONOSCOPY  01/29/2023  . TETANUS/TDAP  06/27/2026  . Hepatitis C Screening  Completed  . HIV Screening  Completed   Cancer Screenings: Lung: Low Dose CT Chest recommended if Age 80-80 years, 30 pack-year currently smoking OR have quit w/in 15years. Patient does not qualify. Colorectal: up to date   Additional Screenings: Hepatitis C Screening:complete       Plan:   Continue to try and quit using tobacco   I have personally reviewed and noted the following in the patient's chart:   . Medical and social history . Use of alcohol, tobacco or illicit drugs  . Current medications and supplements . Functional ability and status . Nutritional status . Physical activity . Advanced directives . List of other physicians . Hospitalizations, surgeries, and ER visits in previous 12  months . Vitals . Screenings to include cognitive, depression, and falls . Referrals and appointments  In addition, I have reviewed and discussed with patient certain preventive protocols, quality metrics, and best practice recommendations. A written personalized care plan for preventive services as well as general preventive health recommendations were provided to patient.     Hayden Pedro, LPN  6/31/4970

## 2018-09-23 NOTE — Patient Instructions (Signed)
[name] , Thank you for taking time to come for your Medicare Wellness Visit. I appreciate your ongoing commitment to your health goals. Please review the following plan we discussed and let me know if I can assist you in the future.   Screening recommendations/referrals: Colonoscopy: up to date  Recommended yearly ophthalmology/optometry visit for glaucoma screening and checkup Recommended yearly dental visit for hygiene and checkup  Vaccinations: Influenza vaccine: given today  Pneumococcal vaccine: needed  Tdap vaccine: up to date  Shingles vaccine: postponed    Advanced directives: patient refused   Conditions/risks identified: hypertension, tobacco use   Next appointment: Wellness visit in one year   Preventive Care 40-64 Years, Male Preventive care refers to lifestyle choices and visits with your health care provider that can promote health and wellness. What does preventive care include?  A yearly physical exam. This is also called an annual well check.  Dental exams once or twice a year.  Routine eye exams. Ask your health care provider how often you should have your eyes checked.  Personal lifestyle choices, including:  Daily care of your teeth and gums.  Regular physical activity.  Eating a healthy diet.  Avoiding tobacco and drug use.  Limiting alcohol use.  Practicing safe sex.  Taking low-dose aspirin every day starting at age 32. What happens during an annual well check? The services and screenings done by your health care provider during your annual well check will depend on your age, overall health, lifestyle risk factors, and family history of disease. Counseling  Your health care provider may ask you questions about your:  Alcohol use.  Tobacco use.  Drug use.  Emotional well-being.  Home and relationship well-being.  Sexual activity.  Eating habits.  Work and work Statistician. Screening  You may have the following tests or  measurements:  Height, weight, and BMI.  Blood pressure.  Lipid and cholesterol levels. These may be checked every 5 years, or more frequently if you are over 68 years old.  Skin check.  Lung cancer screening. You may have this screening every year starting at age 45 if you have a 30-pack-year history of smoking and currently smoke or have quit within the past 15 years.  Fecal occult blood test (FOBT) of the stool. You may have this test every year starting at age 31.  Flexible sigmoidoscopy or colonoscopy. You may have a sigmoidoscopy every 5 years or a colonoscopy every 10 years starting at age 20.  Prostate cancer screening. Recommendations will vary depending on your family history and other risks.  Hepatitis C blood test.  Hepatitis B blood test.  Sexually transmitted disease (STD) testing.  Diabetes screening. This is done by checking your blood sugar (glucose) after you have not eaten for a while (fasting). You may have this done every 1-3 years. Discuss your test results, treatment options, and if necessary, the need for more tests with your health care provider. Vaccines  Your health care provider may recommend certain vaccines, such as:  Influenza vaccine. This is recommended every year.  Tetanus, diphtheria, and acellular pertussis (Tdap, Td) vaccine. You may need a Td booster every 10 years.  Zoster vaccine. You may need this after age 42.  Pneumococcal 13-valent conjugate (PCV13) vaccine. You may need this if you have certain conditions and have not been vaccinated.  Pneumococcal polysaccharide (PPSV23) vaccine. You may need one or two doses if you smoke cigarettes or if you have certain conditions. Talk to your health care provider  about which screenings and vaccines you need and how often you need them. This information is not intended to replace advice given to you by your health care provider. Make sure you discuss any questions you have with your health care  provider. Document Released: 09/08/2015 Document Revised: 05/01/2016 Document Reviewed: 06/13/2015 Elsevier Interactive Patient Education  2017 [place] Prevention in the Home Falls can cause injuries. They can happen to people of all ages. There are many things you can do to make your home safe and to help prevent falls. What can I do on the outside of my home?  Regularly fix the edges of walkways and driveways and fix any cracks.  Remove anything that might make you trip as you walk through a door, such as a raised step or threshold.  Trim any bushes or trees on the path to your home.  Use bright outdoor lighting.  Clear any walking paths of anything that might make someone trip, such as rocks or tools.  Regularly check to see if handrails are loose or broken. Make sure that both sides of any steps have handrails.  Any raised decks and porches should have guardrails on the edges.  Have any leaves, snow, or ice cleared regularly.  Use sand or salt on walking paths during winter.  Clean up any spills in your garage right away. This includes oil or grease spills. What can I do in the bathroom?  Use night lights.  Install grab bars by the toilet and in the tub and shower. Do not use towel bars as grab bars.  Use non-skid mats or decals in the tub or shower.  If you need to sit down in the shower, use a plastic, non-slip stool.  Keep the floor dry. Clean up any water that spills on the floor as soon as it happens.  Remove soap buildup in the tub or shower regularly.  Attach bath mats securely with double-sided non-slip rug tape.  Do not have throw rugs and other things on the floor that can make you trip. What can I do in the bedroom?  Use night lights.  Make sure that you have a light by your bed that is easy to reach.  Do not use any sheets or blankets that are too big for your bed. They should not hang down onto the floor.  Have a firm chair that has  side arms. You can use this for support while you get dressed.  Do not have throw rugs and other things on the floor that can make you trip. What can I do in the kitchen?  Clean up any spills right away.  Avoid walking on wet floors.  Keep items that you use a lot in easy-to-reach places.  If you need to reach something above you, use a strong step stool that has a grab bar.  Keep electrical cords out of the way.  Do not use floor polish or wax that makes floors slippery. If you must use wax, use non-skid floor wax.  Do not have throw rugs and other things on the floor that can make you trip. What can I do with my stairs?  Do not leave any items on the stairs.  Make sure that there are handrails on both sides of the stairs and use them. Fix handrails that are broken or loose. Make sure that handrails are as long as the stairways.  Check any carpeting to make sure that it is firmly attached to  the stairs. Fix any carpet that is loose or worn.  Avoid having throw rugs at the top or bottom of the stairs. If you do have throw rugs, attach them to the floor with carpet tape.  Make sure that you have a light switch at the top of the stairs and the bottom of the stairs. If you do not have them, ask someone to add them for you. What else can I do to help prevent falls?  Wear shoes that:  Do not have high heels.  Have rubber bottoms.  Are comfortable and fit you well.  Are closed at the toe. Do not wear sandals.  If you use a stepladder:  Make sure that it is fully opened. Do not climb a closed stepladder.  Make sure that both sides of the stepladder are locked into place.  Ask someone to hold it for you, if possible.  Clearly mark and make sure that you can see:  Any grab bars or handrails.  First and last steps.  Where the edge of each step is.  Use tools that help you move around (mobility aids) if they are needed. These  include:  Canes.  Walkers.  Scooters.  Crutches.  Turn on the lights when you go into a dark area. Replace any light bulbs as soon as they burn out.  Set up your furniture so you have a clear path. Avoid moving your furniture around.  If any of your floors are uneven, fix them.  If there are any pets around you, be aware of where they are.  Review your medicines with your doctor. Some medicines can make you feel dizzy. This can increase your chance of falling. Ask your doctor what other things that you can do to help prevent falls. This information is not intended to replace advice given to you by your health care provider. Make sure you discuss any questions you have with your health care provider. Document Released: 06/08/2009 Document Revised: 01/18/2016 Document Reviewed: 09/16/2014 Elsevier Interactive Patient Education  2017 [employer].

## 2018-10-06 ENCOUNTER — Ambulatory Visit: Payer: Self-pay | Admitting: Family Medicine

## 2018-10-07 ENCOUNTER — Encounter: Payer: Self-pay | Admitting: Family Medicine

## 2018-10-07 ENCOUNTER — Ambulatory Visit (INDEPENDENT_AMBULATORY_CARE_PROVIDER_SITE_OTHER): Payer: Medicare Other | Admitting: Family Medicine

## 2018-10-07 VITALS — BP 156/90 | HR 89 | Resp 14 | Ht 61.0 in | Wt 150.1 lb

## 2018-10-07 DIAGNOSIS — E663 Overweight: Secondary | ICD-10-CM

## 2018-10-07 DIAGNOSIS — Z125 Encounter for screening for malignant neoplasm of prostate: Secondary | ICD-10-CM

## 2018-10-07 DIAGNOSIS — K4090 Unilateral inguinal hernia, without obstruction or gangrene, not specified as recurrent: Secondary | ICD-10-CM | POA: Insufficient documentation

## 2018-10-07 DIAGNOSIS — F17220 Nicotine dependence, chewing tobacco, uncomplicated: Secondary | ICD-10-CM | POA: Diagnosis not present

## 2018-10-07 DIAGNOSIS — Z720 Tobacco use: Secondary | ICD-10-CM

## 2018-10-07 DIAGNOSIS — I426 Alcoholic cardiomyopathy: Secondary | ICD-10-CM

## 2018-10-07 DIAGNOSIS — F10288 Alcohol dependence with other alcohol-induced disorder: Secondary | ICD-10-CM

## 2018-10-07 DIAGNOSIS — I10 Essential (primary) hypertension: Secondary | ICD-10-CM | POA: Diagnosis not present

## 2018-10-07 DIAGNOSIS — E785 Hyperlipidemia, unspecified: Secondary | ICD-10-CM | POA: Diagnosis not present

## 2018-10-07 DIAGNOSIS — N200 Calculus of kidney: Secondary | ICD-10-CM | POA: Insufficient documentation

## 2018-10-07 DIAGNOSIS — R7301 Impaired fasting glucose: Secondary | ICD-10-CM

## 2018-10-07 MED ORDER — METOPROLOL TARTRATE 25 MG PO TABS: 25.0000 mg | ORAL_TABLET | Freq: Two times a day (BID) | ORAL | 1 refills | Status: DC

## 2018-10-07 NOTE — Assessment & Plan Note (Signed)
Hyperlipidemia:Low fat diet discussed and encouraged.   Lipid Panel  Lab Results  Component Value Date   CHOL 115 (L) 01/15/2016   HDL 45 01/15/2016   LDLCALC 23 01/15/2016   TRIG 237 (H) 01/15/2016   CHOLHDL 2.6 01/15/2016     Updated lab needed

## 2018-10-07 NOTE — Assessment & Plan Note (Signed)
Will defer urology eval , non obstructing, has h/o elevated PSA if this is unchanged , will refer for both

## 2018-10-07 NOTE — Assessment & Plan Note (Signed)
uncontroled due to non compliance, importance of same discussed, he will resume medication he has been taking in the past DASH diet and commitment to daily physical activity for a minimum of 30 minutes discussed and encouraged, as a part of hypertension management. The importance of attaining a healthy weight is also discussed.  BP/Weight 10/07/2018 09/23/2018 08/26/2018 01/22/2017 06/27/2016 02/19/2016 3/97/9536  Systolic BP 922 300 979 499 718 209 906  Diastolic BP 90 95 81 84 80 89 82  Wt. (Lbs) 150.12 148 144.9 148.04 146 151 148  BMI 28.36 27.96 24.11 25.81 25.06 25.91 25.39

## 2018-10-07 NOTE — Assessment & Plan Note (Signed)
Noted on abdominal scan when he recently presented to the Ed with right abdominal and flank pain, refer to gen surgery

## 2018-10-07 NOTE — Assessment & Plan Note (Signed)
Counseled re need to quit , already has heart disease because of excessive alcohol use , his Aunt who cares for and accompanies him , states she will assist in his cutting back on depenedence

## 2018-10-07 NOTE — Patient Instructions (Addendum)
Annual physical exam with EKG  in   approx 8 weeks, call if you ned me before  Sart metoprolol two times daily, 12 hours apart for blood pressure and heart rate  Please get fasting lipid, CBC, TSH, PSA, HBA1C, lipid  this week  Please reduce alcohol weekly, get help with this  Chewing tobacco increases cancer and heart disease risk, pls work on stopping  You are referred to Surgeon about the hernia in your right groin  Wil deal with kidneystone once we get blood work

## 2018-10-07 NOTE — Assessment & Plan Note (Signed)
Deteriorated.  Patient re-educated about  the importance of commitment to a  minimum of 150 minutes of exercise per week as able.  The importance of healthy food choices with portion control discussed, as well as eating regularly and within a 12 hour window most days. The need to choose "clean , green" food 50 to 75% of the time is discussed, as well as to make water the primary drink and set a goal of 64 ounces water daily.  Encouraged to start a food diary,  and to consider  joining a support group. Sample diet sheets offered. Goals set by the patient for the next several months.   Weight /BMI 10/07/2018 09/23/2018 08/26/2018  WEIGHT 150 lb 1.9 oz 148 lb 144 lb 14.4 oz  HEIGHT 5\' 1"  5\' 1"  5\' 5"   BMI 28.36 kg/m2 27.96 kg/m2 24.11 kg/m2

## 2018-10-07 NOTE — Progress Notes (Signed)
Alexander Simmons     MRN: [id number]      DOB: 02-12-1956   HPI Alexander Simmons is here for follow up and re-evaluation of chronic medical conditions, medication management and review of any available recent lab and radiology data.  Preventive health is updated, specifically  Cancer screening and Immunization.   Review of recent Ed visit shows that he has right inguinal hernia, and also right kidney stone, he will be referred for surgery eval, also needs updated PSA as this was elevated in the past and will likely need urology for both problems The PT denies any adverse reactions to current medications since the last visit.  Drinks gin daily, knows should cut back, but not commiting Dipps snuff/ chews tobacco started under 60 y/o, not commiting to stopping ROS Denies recent fever or chills. Denies sinus pressure, nasal congestion, ear pain or sore throat. Denies chest congestion, productive cough or wheezing. Denies chest pains, palpitations and leg swelling Denies abdominal pain, nausea, vomiting,diarrhea or constipation.   Denies dysuria, frequency, hesitancy or incontinence. Denies joint pain, swelling and limitation in mobility. Denies headaches, seizures, numbness, or tingling. Denies depression, anxiety or insomnia. Denies skin break down or rash.   PE  BP (!) 156/90   Pulse 89   Resp 14   Ht 5\' 1"  (1.549 m)   Wt 150 lb 1.9 oz (68.1 kg)   SpO2 99% Comment: room air  BMI 28.36 kg/m   Patient alert and in no cardiopulmonary distress.  HEENT: No facial asymmetry, EOMI,   oropharynx pink and moist.  Neck supple no JVD, no mass.  Chest: Clear to auscultation bilaterally.Decreased air entry  CVS: S1, S2 no murmurs, no S3.Regular rate.  ABD: Soft non tender.   Ext: No edema  MS: Adequate ROM spine, shoulders, hips and knees.  Skin: Intact, no ulcerations or rash noted.  Psych: Good eye contact, normal affect. Mildly  anxious not  depressed appearing.  CNS: CN 2-12 intact,  power,  normal throughout.no focal deficits noted.   Assessment & Plan  Hypertension, essential uncontroled due to non compliance, importance of same discussed, he will resume medication he has been taking in the past DASH diet and commitment to daily physical activity for a minimum of 30 minutes discussed and encouraged, as a part of hypertension management. The importance of attaining a healthy weight is also discussed.  BP/Weight 10/07/2018 09/23/2018 08/26/2018 01/22/2017 06/27/2016 02/19/2016 0/93/2355  Systolic BP 732 202 542 706 237 628 315  Diastolic BP 90 95 81 84 80 89 82  Wt. (Lbs) 150.12 148 144.9 148.04 146 151 148  BMI 28.36 27.96 24.11 25.81 25.06 25.91 25.39       Tobacco abuse Asked:confirms currently usews nicotine, snuff for over 55 years. Assess: Unwilling to quit and not cutting back Advise: needs to QUIT to reduce risk of cancer, cardio and cerebrovascular disease Assist: counseled for 5 minutes and literature provided Arrange: follow up in 3 months   Overweight (BMI 25.0-29.9) Deteriorated.  Patient re-educated about  the importance of commitment to a  minimum of 150 minutes of exercise per week as able.  The importance of healthy food choices with portion control discussed, as well as eating regularly and within a 12 hour window most days. The need to choose "clean , green" food 50 to 75% of the time is discussed, as well as to make water the primary drink and set a goal of 64 ounces water daily.  Encouraged to start a  food diary,  and to consider  joining a support group. Sample diet sheets offered. Goals set by the patient for the next several months.   Weight /BMI 10/07/2018 09/23/2018 08/26/2018  WEIGHT 150 lb 1.9 oz 148 lb 144 lb 14.4 oz  HEIGHT 5\' 1"  5\' 1"  5\' 5"   BMI 28.36 kg/m2 27.96 kg/m2 24.11 kg/m2      Hyperlipidemia Hyperlipidemia:Low fat diet discussed and encouraged.   Lipid Panel  Lab Results  Component Value Date   CHOL 115 (L)  01/15/2016   HDL 45 01/15/2016   LDLCALC 23 01/15/2016   TRIG 237 (H) 01/15/2016   CHOLHDL 2.6 01/15/2016     Updated lab needed   Alcohol dependence Counseled re need to quit , already has heart disease because of excessive alcohol use , Alexander Simmons , states she will assist in Alexander cutting back on depenedence  Right inguinal hernia Noted on abdominal scan when he recently presented to the Ed with right abdominal and flank pain, refer to gen surgery   Renal calculus, right Will defer urology eval , non obstructing, has h/o elevated PSA if this is unchanged , will refer for both

## 2018-10-07 NOTE — Assessment & Plan Note (Signed)
Asked:confirms currently usews nicotine, snuff for over 55 years. Assess: Unwilling to quit and not cutting back Advise: needs to QUIT to reduce risk of cancer, cardio and cerebrovascular disease Assist: counseled for 5 minutes and literature provided Arrange: follow up in 3 months

## 2018-10-13 DIAGNOSIS — E785 Hyperlipidemia, unspecified: Secondary | ICD-10-CM | POA: Diagnosis not present

## 2018-10-13 DIAGNOSIS — I10 Essential (primary) hypertension: Secondary | ICD-10-CM | POA: Diagnosis not present

## 2018-10-13 DIAGNOSIS — R7301 Impaired fasting glucose: Secondary | ICD-10-CM | POA: Diagnosis not present

## 2018-10-14 LAB — CBC
HCT: 38.9 % (ref 38.5–50.0)
Hemoglobin: 13.3 g/dL (ref 13.2–17.1)
MCH: 33.9 pg — AB (ref 27.0–33.0)
MCHC: 34.2 g/dL (ref 32.0–36.0)
MCV: 99.2 fL (ref 80.0–100.0)
MPV: 9.9 fL (ref 7.5–12.5)
Platelets: 264 10*3/uL (ref 140–400)
RBC: 3.92 10*6/uL — AB (ref 4.20–5.80)
RDW: 13.7 % (ref 11.0–15.0)
WBC: 3.4 10*3/uL — ABNORMAL LOW (ref 3.8–10.8)

## 2018-10-14 LAB — LIPID PANEL
CHOL/HDL RATIO: 2.7 (calc) (ref <5.0)
Cholesterol: 183 mg/dL (ref <200)
HDL: 67 mg/dL (ref >=40)
LDL CHOLESTEROL (CALC): 90 mg/dL
Non-HDL Cholesterol (Calc): 116 mg/dL (calc) (ref <130)
Triglycerides: 160 mg/dL — ABNORMAL HIGH (ref <150)

## 2018-10-14 LAB — HEMOGLOBIN A1C
HEMOGLOBIN A1C: 5.4 %{Hb} (ref <5.7)
Mean Plasma Glucose: 108 (calc)
eAG (mmol/L): 6 (calc)

## 2018-10-14 LAB — PSA: PSA: 1.5 ng/mL (ref <=4.0)

## 2018-10-14 LAB — TSH: TSH: 1.67 m[IU]/L (ref 0.40–4.50)

## 2018-11-05 ENCOUNTER — Ambulatory Visit: Payer: Medicare Other | Admitting: General Surgery

## 2018-11-19 ENCOUNTER — Ambulatory Visit: Payer: Medicare Other | Admitting: General Surgery

## 2018-12-02 ENCOUNTER — Encounter: Payer: Self-pay | Admitting: *Deleted

## 2018-12-03 ENCOUNTER — Encounter: Payer: Self-pay | Admitting: Family Medicine

## 2019-02-11 ENCOUNTER — Other Ambulatory Visit: Payer: Self-pay

## 2019-02-11 ENCOUNTER — Encounter: Payer: Self-pay | Admitting: General Surgery

## 2019-02-11 ENCOUNTER — Encounter (INDEPENDENT_AMBULATORY_CARE_PROVIDER_SITE_OTHER): Payer: Self-pay

## 2019-02-11 ENCOUNTER — Ambulatory Visit (INDEPENDENT_AMBULATORY_CARE_PROVIDER_SITE_OTHER): Payer: Medicare Other | Admitting: General Surgery

## 2019-02-11 VITALS — BP 151/99 | HR 74 | Temp 96.8°F | Resp 16 | Wt 150.0 lb

## 2019-02-11 DIAGNOSIS — K4090 Unilateral inguinal hernia, without obstruction or gangrene, not specified as recurrent: Secondary | ICD-10-CM

## 2019-02-11 NOTE — Progress Notes (Signed)
Alexander Simmons; [id number]; 04-07-1956   HPI Patient is a 63 year old black male who was referred to my care by Dr. Moshe Cipro for evaluation and treatment of a right inguinal hernia.  He was seen in the emergency room earlier this year with right flank pain.  He was found to have a right inguinal hernia at that time.  He states that since then, his hernia is gone.  He denies any right groin pain.  He denies any swelling.  He denies any nausea or vomiting.  He currently has 0 out of 10 abdominal pain. Past Medical History:  Diagnosis Date  . Alcohol abuse   . Allergy   . Cor pulmonale ([facility])    By echocardiography  . Glaucoma   . Hyperlipidemia   . Hypertension   . Small bowel obstruction ([place])   . Syncope 12/2009   Attributed to use of narcotics plus alcohol  . Tobacco abuse     Past Surgical History:  Procedure Laterality Date  . ABDOMINAL SURGERY    . COLONOSCOPY N/A 01/28/2013   Procedure: COLONOSCOPY;  Surgeon: Rogene Houston, MD;  Location: [facility];  Service: Endoscopy;  Laterality: N/A;  1225  . PILONIDAL CYST EXCISION  2005   Dr. Irving Shows  . TOOTH EXTRACTION      Family History  Problem Relation Age of Onset  . Other Other        FH unknown-patient raised in foster care system  . Colon cancer Neg Hx     Current Outpatient Medications on File Prior to Visit  Medication Sig Dispense Refill  . metoprolol tartrate (LOPRESSOR) 25 MG tablet Take 1 tablet (25 mg total) by mouth 2 (two) times daily. 180 tablet 1   No current facility-administered medications on file prior to visit.     No Known Allergies  Social History   Substance and Sexual Activity  Alcohol Use Yes  . Alcohol/week: 20.0 standard drinks  . Types: 20 Shots of liquor per week   Comment: 1 pint of gin daily (4cups daily)    Social History   Tobacco Use  Smoking Status Never Smoker  Smokeless Tobacco Current User  . Types: Snuff, Chew  Tobacco Comment   chews tobacco since age 8.     Review of Systems  Constitutional: Positive for chills.  HENT: Negative.   Eyes: Negative.   Respiratory: Negative.   Cardiovascular: Negative.   Gastrointestinal: Positive for heartburn.  Genitourinary: Positive for frequency.  Musculoskeletal: Positive for back pain.  Skin: Negative.   Neurological: Negative.   Endo/Heme/Allergies: Negative.   Psychiatric/Behavioral: Negative.     Objective   Vitals:   02/11/19 1009  BP: (!) 151/99  Pulse: 74  Resp: 16  Temp: (!) 96.8 F (36 C)  SpO2: 97%    Physical Exam Vitals signs reviewed.  Constitutional:      Appearance: Normal appearance. He is not ill-appearing.  HENT:     Head: Normocephalic and atraumatic.  Cardiovascular:     Rate and Rhythm: Normal rate and regular rhythm.     Heart sounds: Normal heart sounds. No murmur. No friction rub. No gallop.   Pulmonary:     Effort: Pulmonary effort is normal. No respiratory distress.     Breath sounds: Normal breath sounds. No stridor. No wheezing, rhonchi or rales.  Abdominal:     General: Abdomen is flat. Bowel sounds are normal. There is no distension.     Palpations: Abdomen is soft. There is  no mass.     Tenderness: There is no abdominal tenderness. There is no guarding or rebound.     Hernia: A hernia is present.     Comments: Small easily reducible right inguinal hernia.  Genitourinary:    Comments: Genitourinary examination is within normal limits. Skin:    General: Skin is warm and dry.  Neurological:     Mental Status: He is alert and oriented to person, place, and time.   Dr. Griffin Dakin notes reviewed  Assessment  Right inguinal hernia, currently asymptomatic Plan   As his hernia is asymptomatic, there is no need for right inguinal herniorrhaphy at this time.  Literature was given concerning the signs and symptoms of incarceration.  Should he develop more symptoms, he was instructed to return to my office.  He is fine with that.  Follow-up as needed.

## 2019-02-11 NOTE — Patient Instructions (Signed)

## 2019-03-08 ENCOUNTER — Encounter: Payer: Medicare Other | Admitting: Family Medicine

## 2019-03-29 ENCOUNTER — Encounter: Payer: Medicare Other | Admitting: Family Medicine

## 2019-03-31 ENCOUNTER — Other Ambulatory Visit: Payer: Self-pay

## 2019-03-31 ENCOUNTER — Encounter: Payer: Medicare Other | Admitting: Family Medicine

## 2019-03-31 ENCOUNTER — Encounter: Payer: Self-pay | Admitting: Family Medicine

## 2019-03-31 ENCOUNTER — Ambulatory Visit (INDEPENDENT_AMBULATORY_CARE_PROVIDER_SITE_OTHER): Payer: Medicare Other | Admitting: Family Medicine

## 2019-03-31 DIAGNOSIS — K529 Noninfective gastroenteritis and colitis, unspecified: Secondary | ICD-10-CM | POA: Diagnosis not present

## 2019-03-31 MED ORDER — DIPHENOXYLATE-ATROPINE 2.5-0.025 MG PO TABS: ORAL_TABLET | ORAL | 0 refills | Status: DC

## 2019-03-31 NOTE — Progress Notes (Signed)
Virtual Visit via Telephone Note  I connected with Alexander Simmons on 03/31/19 at 10:00 AM EDT by telephone and verified that I am speaking with the correct person using two identifiers.  Location: Patient: home  Provider: office   I discussed the limitations, risks, security and privacy concerns of performing an evaluation and management service by telephone and the availability of in person appointments. I also discussed with the patient that there may be a patient responsible charge related to this service. The patient expressed understanding and agreed to proceed.   History of Present Illness: 2 dfay h/o loose watery stool, no fever, chills, nausea or vomiting, no one else affected, mild stomach cramping   Observations/Objective:   Assessment and Plan: Acute gastroenteritis BRAT diet  Lomotil as needed, 6 tabs  Ensure adequate hydration    Follow Up Instructions:    I discussed the assessment and treatment plan with the patient. The patient was provided an opportunity to ask questions and all were answered. The patient agreed with the plan and demonstrated an understanding of the instructions.   The patient was advised to call back or seek an in-person evaluation if the symptoms worsen or if the condition fails to improve as anticipated.  I provided 6 minutes of non-face-to-face time during this encounter.   Tula Nakayama, MD

## 2019-03-31 NOTE — Assessment & Plan Note (Signed)
BRAT diet  Lomotil as needed, 6 tabs  Ensure adequate hydration

## 2019-04-06 ENCOUNTER — Ambulatory Visit (INDEPENDENT_AMBULATORY_CARE_PROVIDER_SITE_OTHER): Payer: Medicare Other | Admitting: Family Medicine

## 2019-04-06 ENCOUNTER — Encounter: Payer: Self-pay | Admitting: Family Medicine

## 2019-04-06 ENCOUNTER — Other Ambulatory Visit: Payer: Self-pay

## 2019-04-06 VITALS — BP 138/84 | HR 93 | Temp 98.3°F | Resp 15 | Ht 61.0 in | Wt 148.0 lb

## 2019-04-06 DIAGNOSIS — E559 Vitamin D deficiency, unspecified: Secondary | ICD-10-CM

## 2019-04-06 DIAGNOSIS — Z720 Tobacco use: Secondary | ICD-10-CM

## 2019-04-06 DIAGNOSIS — E785 Hyperlipidemia, unspecified: Secondary | ICD-10-CM

## 2019-04-06 DIAGNOSIS — Z0000 Encounter for general adult medical examination without abnormal findings: Secondary | ICD-10-CM | POA: Diagnosis not present

## 2019-04-06 DIAGNOSIS — Z125 Encounter for screening for malignant neoplasm of prostate: Secondary | ICD-10-CM

## 2019-04-06 DIAGNOSIS — F17220 Nicotine dependence, chewing tobacco, uncomplicated: Secondary | ICD-10-CM

## 2019-04-06 DIAGNOSIS — H547 Unspecified visual loss: Secondary | ICD-10-CM

## 2019-04-06 DIAGNOSIS — E663 Overweight: Secondary | ICD-10-CM

## 2019-04-06 DIAGNOSIS — I10 Essential (primary) hypertension: Secondary | ICD-10-CM

## 2019-04-06 NOTE — Assessment & Plan Note (Signed)

## 2019-04-06 NOTE — Patient Instructions (Addendum)
Annual exam with MD end August 2021, call if you need me sooner  Pt to have fasting CBC, lipid, cmp and EGFr, PSA, TSH and vit D in August 2021, 1 week before follow up  Please continue to work on stopping snuff, and alcohol  You are referred for eye exam, need glasses  Call in 2 to 3 weeks for flu vaccine please  Social distancing. Frequent hand washing with soap and water Keeping your hands off of your face. These 3 practices will help to keep both you and your community healthy during this time. Please practice them faithfully!   Thanks for choosing [facility], we consider it a privelige to serve you.

## 2019-04-06 NOTE — Progress Notes (Signed)
   Alexander Simmons     MRN: [id number]      DOB: 01-23-1956   HPI: Patient is in for annual physical exam. No other health concerns are expressed or addressed at the visit.  Immunization is reviewed , and  updated if needed.    PE; BP 138/84   Pulse 93   Temp 98.3 F (36.8 C) (Temporal)   Resp 15   Ht 5\' 1"  (1.549 m)   Wt 148 lb (67.1 kg)   SpO2 99%   BMI 27.96 kg/m   Pleasant male, alert and oriented x 3, in no cardio-pulmonary distress. Afebrile. HEENT No facial trauma or asymetry. Sinuses non tender. EOMI,. External ears normal, Neck: supple, no adenopathy,JVD or thyromegaly.No bruits.  Chest: Clear to ascultation bilaterally.No crackles or wheezes. Non tender to palpation  Cardiovascular system; Heart sounds normal,  S1 and  S2 ,no S3.  No murmur, or thrill. Apical beat not displaced Peripheral pulses normal.  Abdomen: Soft, non tender, No guarding, tenderness or rebound.    Musculoskeletal exam: Full ROM of spine, hips , shoulders and knees. No deformity ,swelling or crepitus noted. No muscle wasting or atrophy.   Neurologic: Cranial nerves 2 to 12 intact. Power, tone ,sensation and reflexes normal throughout. No disturbance in gait. No tremor.  Skin: Intact, no ulceration, erythema , scaling or rash noted. Pigmentation normal throughout  Psych; Normal mood and affect.   Assessment & Plan:  Annual physical exam Annual exam as documented. Counseling done  re healthy lifestyle involving commitment to 150 minutes exercise per week, heart healthy diet, and attaining healthy weight.The importance of adequate sleep also discussed. Regular seat belt use and home safety, is also discussed. Changes in health habits are decided on by the patient with goals and time frames  set for achieving them. Immunization and cancer screening needs are specifically addressed at this visit.   Overweight (BMI 25.0-29.9)  Patient re-educated about  the importance of  commitment to a  minimum of 150 minutes of exercise per week as able.  The importance of healthy food choices with portion control discussed, as well as eating regularly and within a 12 hour window most days. The need to choose "clean , green" food 50 to 75% of the time is discussed, as well as to make water the primary drink and set a goal of 64 ounces water daily.    Weight /BMI 04/06/2019 03/31/2019 02/11/2019  WEIGHT 148 lb 150 lb 150 lb  HEIGHT 5\' 1"  5\' 1"  -  BMI 27.96 kg/m2 28.34 kg/m2 28.34 kg/m2      Tobacco abuse Asked:confirms currently dips snuff Assess: Unwilling to quit but cutting back Advise: needs to QUIT to reduce risk of cancer, cardio and cerebrovascular disease Assist: counseled for 5 minutes Arrange: follow up in 3 months   Reduced vision Markedly reduced vision , reports losing prescription glasses, refer for eval

## 2019-04-09 ENCOUNTER — Encounter: Payer: Self-pay | Admitting: Family Medicine

## 2019-04-09 NOTE — Assessment & Plan Note (Signed)
Asked:confirms currently dips snuff Assess: Unwilling to quit but cutting back Advise: needs to QUIT to reduce risk of cancer, cardio and cerebrovascular disease Assist: counseled for 5 minutes Arrange: follow up in 3 months

## 2019-04-09 NOTE — Assessment & Plan Note (Signed)
Markedly reduced vision , reports losing prescription glasses, refer for eval

## 2019-04-09 NOTE — Assessment & Plan Note (Signed)
  Patient re-educated about  the importance of commitment to a  minimum of 150 minutes of exercise per week as able.  The importance of healthy food choices with portion control discussed, as well as eating regularly and within a 12 hour window most days. The need to choose "clean , green" food 50 to 75% of the time is discussed, as well as to make water the primary drink and set a goal of 64 ounces water daily.    Weight /BMI 04/06/2019 03/31/2019 02/11/2019  WEIGHT 148 lb 150 lb 150 lb  HEIGHT 5\' 1"  5\' 1"  -  BMI 27.96 kg/m2 28.34 kg/m2 28.34 kg/m2

## 2019-04-26 ENCOUNTER — Ambulatory Visit: Payer: Medicare Other

## 2019-04-26 ENCOUNTER — Other Ambulatory Visit: Payer: Self-pay

## 2019-05-10 ENCOUNTER — Other Ambulatory Visit: Payer: Self-pay

## 2019-05-10 ENCOUNTER — Ambulatory Visit (INDEPENDENT_AMBULATORY_CARE_PROVIDER_SITE_OTHER): Payer: Medicare Other

## 2019-05-10 DIAGNOSIS — Z23 Encounter for immunization: Secondary | ICD-10-CM | POA: Diagnosis not present

## 2019-08-27 DIAGNOSIS — S42293A Other displaced fracture of upper end of unspecified humerus, initial encounter for closed fracture: Secondary | ICD-10-CM

## 2019-08-27 HISTORY — DX: Other displaced fracture of upper end of unspecified humerus, initial encounter for closed fracture: S42.293A

## 2019-08-31 ENCOUNTER — Telehealth: Payer: Self-pay

## 2019-08-31 NOTE — Telephone Encounter (Signed)
Virtual Visit Pre-Appointment Phone Call  "(Name), I am calling you today to discuss your upcoming appointment. We are currently trying to limit exposure to the virus that causes COVID-19 by seeing patients at home rather than in the office."  "What is the BEST phone number to call the day of the visit?" - include this in appointment notes  "Do you have or have access to (through a family member/friend) a smartphone with video capability that we can use for your visit?" If yes - list this number in appt notes as "cell" (if different from BEST phone #) and list the appointment type as a VIDEO visit in appointment notes If no - list the appointment type as a PHONE visit in appointment notes  Confirm consent - "In the setting of the current Covid19 crisis, you are scheduled for a (phone or video) visit with your provider on (date) at (time).  Just as we do with many in-office visits, in order for you to participate in this visit, we must obtain consent.  If you'd like, I can send this to your mychart (if signed up) or email for you to review.  Otherwise, I can obtain your verbal consent now.  All virtual visits are billed to your insurance company just like a normal visit would be.  By agreeing to a virtual visit, we'd like you to understand that the technology does not allow for your provider to perform an examination, and thus may limit your provider's ability to fully assess your condition. If your provider identifies any concerns that need to be evaluated in person, we will make arrangements to do so.  Finally, though the technology is pretty good, we cannot assure that it will always work on either your or our end, and in the setting of a video visit, we may have to convert it to a phone-only visit.  In either situation, we cannot ensure that we have a secure connection.  Are you willing to proceed?" STAFF: Did the patient verbally acknowledge consent to telehealth visit? Document YES/NO here:    Advise patient to be prepared - "Two hours prior to your appointment, go ahead and check your blood pressure, pulse, oxygen saturation, and your weight (if you have the equipment to check those) and write them all down. When your visit starts, your provider will ask you for this information. If you have an Apple Watch or Kardia device, please plan to have heart rate information ready on the day of your appointment. Please have a pen and paper handy nearby the day of the visit as well."  Give patient instructions for MyChart download to smartphone OR [email or web address] as below if video visit (depending on what platform provider is using)  Inform patient they will receive a phone call 15 minutes prior to their appointment time (may be from unknown caller ID) so they should be prepared to answer    TELEPHONE CALL NOTE  Alexander Simmons has been deemed a candidate for a follow-up tele-health visit to limit community exposure during the Covid-19 pandemic. I spoke with the patient via phone to ensure availability of phone/video source, confirm preferred email & phone number, and discuss instructions and expectations.  I reminded Alexander Simmons to be prepared with any vital sign and/or heart rhythm information that could potentially be obtained via home monitoring, at the time of his visit. I reminded Alexander Simmons to expect a phone call prior to his visit.  Dorothey Baseman 08/31/2019 9:57 AM  INSTRUCTIONS FOR DOWNLOADING THE MYCHART APP TO SMARTPHONE  - The patient must first make sure to have activated MyChart and know their login information - If Apple, go to [employer] and type in MyChart in the search bar and download the app. If Android, ask patient to go to [employer] and type in [place] in the search bar and download the app. The app is free but as with any other app downloads, their phone may require them to verify saved payment information or Apple/Android password.  - The patient will  need to then log into the app with their MyChart username and password, and select [facility] as their healthcare provider to link the account. When it is time for your visit, go to the MyChart app, find appointments, and click Begin Video Visit. Be sure to Select Allow for your device to access the Microphone and Camera for your visit. You will then be connected, and your provider will be with you shortly.  **If they have any issues connecting, or need assistance please contact MyChart service desk [phone])**  **If using a computer, in order to ensure the best quality for their visit they will need to use either of the following Internet Browsers: [employer], or Google Chrome**  IF USING DOXIMITY or DOXY.ME - The patient will receive a link just prior to their visit by text.     FULL LENGTH CONSENT FOR TELE-HEALTH VISIT   I hereby voluntarily request, consent and authorize [facility] and its employed or contracted physicians, physician assistants, nurse practitioners or other licensed health care professionals (the Practitioner), to provide me with telemedicine health care services (the "Services") as deemed necessary by the treating Practitioner. I acknowledge and consent to receive the Services by the Practitioner via telemedicine. I understand that the telemedicine visit will involve communicating with the Practitioner through live audiovisual communication technology and the disclosure of certain medical information by electronic transmission. I acknowledge that I have been given the opportunity to request an in-person assessment or other available alternative prior to the telemedicine visit and am voluntarily participating in the telemedicine visit.  I understand that I have the right to withhold or withdraw my consent to the use of telemedicine in the course of my care at any time, without affecting my right to future care or treatment, and that the Practitioner or I  may terminate the telemedicine visit at any time. I understand that I have the right to inspect all information obtained and/or recorded in the course of the telemedicine visit and may receive copies of available information for a reasonable fee.  I understand that some of the potential risks of receiving the Services via telemedicine include:  Delay or interruption in medical evaluation due to technological equipment failure or disruption; Information transmitted may not be sufficient (e.g. poor resolution of images) to allow for appropriate medical decision making by the Practitioner; and/or  In rare instances, security protocols could fail, causing a breach of personal health information.  Furthermore, I acknowledge that it is my responsibility to provide information about my medical history, conditions and care that is complete and accurate to the best of my ability. I acknowledge that Practitioner's advice, recommendations, and/or decision may be based on factors not within their control, such as incomplete or inaccurate data provided by me or distortions of diagnostic images or specimens that may result from electronic transmissions. I understand that the practice of medicine is not an exact science and that Practitioner  makes no warranties or guarantees regarding treatment outcomes. I acknowledge that I will receive a copy of this consent concurrently upon execution via email to the email address I last provided but may also request a printed copy by calling the office of [facility].    I understand that my insurance will be billed for this visit.   I have read or had this consent read to me. I understand the contents of this consent, which adequately explains the benefits and risks of the Services being provided via telemedicine.  I have been provided ample opportunity to ask questions regarding this consent and the Services and have had my questions answered to my satisfaction. I give my  informed consent for the services to be provided through the use of telemedicine in my medical care  By participating in this telemedicine visit I agree to the above.

## 2019-09-01 ENCOUNTER — Encounter: Payer: Self-pay | Admitting: Cardiovascular Disease

## 2019-09-01 ENCOUNTER — Telehealth (INDEPENDENT_AMBULATORY_CARE_PROVIDER_SITE_OTHER): Payer: Medicare Other | Admitting: Cardiovascular Disease

## 2019-09-01 VITALS — Ht 64.0 in | Wt 150.0 lb

## 2019-09-01 DIAGNOSIS — R002 Palpitations: Secondary | ICD-10-CM

## 2019-09-01 DIAGNOSIS — I471 Supraventricular tachycardia: Secondary | ICD-10-CM | POA: Diagnosis not present

## 2019-09-01 DIAGNOSIS — I10 Essential (primary) hypertension: Secondary | ICD-10-CM

## 2019-09-01 DIAGNOSIS — I426 Alcoholic cardiomyopathy: Secondary | ICD-10-CM | POA: Diagnosis not present

## 2019-09-01 NOTE — Progress Notes (Signed)
Virtual Visit via Telephone Note   This visit type was conducted due to national recommendations for restrictions regarding the COVID-19 Pandemic (e.g. social distancing) in an effort to limit this patient's exposure and mitigate transmission in our community.  Due to his co-morbid illnesses, this patient is at least at moderate risk for complications without adequate follow up.  This format is felt to be most appropriate for this patient at this time.  The patient did not have access to video technology/had technical difficulties with video requiring transitioning to audio format only (telephone).  All issues noted in this document were discussed and addressed.  No physical exam could be performed with this format.  Please refer to the patient's chart for his  consent to telehealth for [facility].   Date:  09/01/2019   ID:  Idamae Schuller, DOB 27-Sep-1955, MRN [id number]  Patient Location: Home Provider Location: Home  PCP:  Fayrene Helper, MD  Cardiologist:  Kate Sable, MD  Electrophysiologist:  None   Evaluation Performed:  New Patient Evaluation  Chief Complaint:  PSVT  History of Present Illness:    MECCA PUFF is a 64 y.o. male with a history of alcoholism and alcohol-induced cardiomyopathy as well as palpitations and paroxysmal SVT. He also reportedly has a history of intellectual impairment.  Echocardiogram in July 2014 demonstrated normal left ventricular systolic function, LVEF 0000000, mild LVH, and grade 2 diastolic dysfunction.  The patient denies any symptoms of chest pain, palpitations, shortness of breath, lightheadedness, dizziness, leg swelling, orthopnea, PND, and syncope.    Past Medical History:  Diagnosis Date  . Alcohol abuse   . Allergy   . Cor pulmonale (HCC)    By echocardiography  . Glaucoma   . Hyperlipidemia   . Hypertension   . Small bowel obstruction ([place])   . Syncope 12/2009   Attributed to use of narcotics plus alcohol  .  Tobacco abuse    Past Surgical History:  Procedure Laterality Date  . ABDOMINAL SURGERY    . COLONOSCOPY N/A 01/28/2013   Procedure: COLONOSCOPY;  Surgeon: Rogene Houston, MD;  Location: [facility];  Service: Endoscopy;  Laterality: N/A;  1225  . PILONIDAL CYST EXCISION  2005   Dr. Irving Shows  . TOOTH EXTRACTION       Current Meds  Medication Sig  . metoprolol tartrate (LOPRESSOR) 25 MG tablet Take 1 tablet (25 mg total) by mouth 2 (two) times daily.     Allergies:   Patient has no known allergies.   Social History   Tobacco Use  . Smoking status: Never Smoker  . Smokeless tobacco: Current User    Types: Snuff, Chew  . Tobacco comment: chews tobacco since age 64.  Substance Use Topics  . Alcohol use: Yes    Alcohol/week: 20.0 standard drinks    Types: 20 Shots of liquor per week    Comment: 1 pint of gin daily (4cups daily)  . Drug use: No     Family Hx: The patient's family history includes Other in an other family member. There is no history of Colon cancer.  ROS:   Please see the history of present illness.     All other systems reviewed and are negative.   Prior CV studies:   The following studies were reviewed today:  Reviewed above  Labs/Other Tests and Data Reviewed:    EKG:  No ECG reviewed.  Recent Labs: 10/13/2018: Hemoglobin 13.3; Platelets 264; TSH 1.67  Recent Lipid Panel Lab Results  Component Value Date/Time   CHOL 183 10/13/2018 10:20 AM   TRIG 160 (H) 10/13/2018 10:20 AM   HDL 67 10/13/2018 10:20 AM   CHOLHDL 2.7 10/13/2018 10:20 AM   LDLCALC 90 10/13/2018 10:20 AM    Wt Readings from Last 3 Encounters:  09/01/19 150 lb (68 kg)  04/06/19 148 lb (67.1 kg)  03/31/19 150 lb (68 kg)     Objective:    Vital Signs:  Ht 5\' 4"  (1.626 m)   Wt 150 lb (68 kg)   BMI 25.75 kg/m    VITAL SIGNS:  reviewed  ASSESSMENT & PLAN:    1.  Palpitations/paroxysmal SVT: Symptomatically stable.  Continue metoprolol.  2.  Alcohol-induced  cardiomyopathy: LV systolic function was normal by echocardiogram in July 2014.  No longer on lisinopril.  Continue metoprolol.  3.  Hypertension: No changes.  COVID-19 Education: The signs and symptoms of COVID-19 were discussed with the patient and how to seek care for testing (follow up with PCP or arrange E-visit).  The importance of social distancing was discussed today.  Time:   Today, I have spent 5 minutes with the patient with telehealth technology discussing the above problems.     Medication Adjustments/Labs and Tests Ordered: Current medicines are reviewed at length with the patient today.  Concerns regarding medicines are outlined above.   Tests Ordered: No orders of the defined types were placed in this encounter.   Medication Changes: No orders of the defined types were placed in this encounter.   Follow Up:  Virtual Visit  prn  Signed, Kate Sable, MD  09/01/2019 3:22 PM    [place]

## 2019-09-01 NOTE — Patient Instructions (Addendum)
Medication Instructions:   Your physician recommends that you continue on your current medications as directed. Please refer to the Current Medication list given to you today.  Labwork:  NONE  Testing/Procedures:  NONE  Follow-Up:  Your physician recommends that you schedule a follow-up appointment in: as needed.   Any Other Special Instructions Will Be Listed Below (If Applicable).  If you need a refill on your cardiac medications before your next appointment, please call your pharmacy. 

## 2019-09-27 ENCOUNTER — Other Ambulatory Visit: Payer: Self-pay | Admitting: Family Medicine

## 2019-10-01 ENCOUNTER — Ambulatory Visit (INDEPENDENT_AMBULATORY_CARE_PROVIDER_SITE_OTHER): Payer: Medicare Other | Admitting: Family Medicine

## 2019-10-01 ENCOUNTER — Other Ambulatory Visit: Payer: Self-pay

## 2019-10-01 ENCOUNTER — Encounter: Payer: Self-pay | Admitting: Family Medicine

## 2019-10-01 VITALS — BP 138/84 | HR 93 | Resp 15 | Ht 61.0 in | Wt 148.0 lb

## 2019-10-01 DIAGNOSIS — Z0000 Encounter for general adult medical examination without abnormal findings: Secondary | ICD-10-CM | POA: Diagnosis not present

## 2019-10-01 NOTE — Progress Notes (Signed)
Subjective:   Alexander Simmons is a 64 y.o. male who presents for Medicare Annual/Subsequent preventive examination.  Location of Patient: Home Location of Provider: Telehealth Consent was obtain for visit to be over via telehealth. I verified that I am speaking with the correct person using two identifiers.  Review of Systems:    Cardiac Risk Factors include: advanced age (>85men, >76 women);dyslipidemia;hypertension;male gender     Objective:    Vitals: BP 138/84   Pulse 93   Resp 15   Ht 5\' 1"  (1.549 m)   Wt 148 lb (67.1 kg)   BMI 27.96 kg/m   Body mass index is 27.96 kg/m.  Advanced Directives 09/23/2018 08/26/2018 01/22/2017 01/28/2013  Does Patient Have a Medical Advance Directive? No No No Patient does not have advance directive;Patient would not like information  Would patient like information on creating a medical advance directive? No - Patient declined No - Patient declined Yes (MAU/Ambulatory/Procedural Areas - Information given) -  Pre-existing out of facility DNR order (yellow form or pink MOST form) - - - No    Tobacco Social History   Tobacco Use  Smoking Status Never Smoker  Smokeless Tobacco Current User  . Types: Snuff, Chew  Tobacco Comment   chews tobacco since age 44.     Ready to quit: Yes Counseling given: Yes Comment: chews tobacco since age 45.   Clinical Intake:  Pre-visit preparation completed: Yes  Pain : No/denies pain Pain Score: 0-No pain     BMI - recorded: 27.96 Nutritional Status: BMI 25 -29 Overweight Nutritional Risks: None Diabetes: No  How often do you need to have someone help you when you read instructions, pamphlets, or other written materials from your doctor or pharmacy?: 1 - Never What is the last grade level you completed in school?: 12  Interpreter Needed?: No     Past Medical History:  Diagnosis Date  . Alcohol abuse   . Allergy   . Cor pulmonale (HCC)    By echocardiography  . Glaucoma   .  Hyperlipidemia   . Hypertension   . Small bowel obstruction ([place])   . Syncope 12/2009   Attributed to use of narcotics plus alcohol  . Tobacco abuse    Past Surgical History:  Procedure Laterality Date  . ABDOMINAL SURGERY    . COLONOSCOPY N/A 01/28/2013   Procedure: COLONOSCOPY;  Surgeon: Rogene Houston, MD;  [facility];  Service: Endoscopy;  Laterality: N/A;  1225  . PILONIDAL CYST EXCISION  2005   Dr. Irving Shows  . TOOTH EXTRACTION     Family History  Problem Relation Age of Onset  . Other Other        FH unknown-patient raised in foster care system  . Colon cancer Neg Hx    Social History   Socioeconomic History  . Marital status: Single    Spouse name: Not on file  . Number of children: 1  . Years of education: 53  . Highest education level: 12th grade  Occupational History  . Occupation: disabled   Tobacco Use  . Smoking status: Never Smoker  . Smokeless tobacco: Current User    Types: Snuff, Chew  . Tobacco comment: chews tobacco since age 16.  Substance and Sexual Activity  . Alcohol use: Yes    Alcohol/week: 20.0 standard drinks    Types: 20 Shots of liquor per week    Comment: 1 pint of gin daily (4cups daily)  . Drug use:  No  . Sexual activity: Not Currently  Other Topics Concern  . Not on file  Social History Narrative   Lives with his niece    Social Determinants of Health   Financial Resource Strain:   . Difficulty of Paying Living Expenses: Not on file  Food Insecurity:   . Worried About Charity fundraiser in the Last Year: Not on file  . Ran Out of Food in the Last Year: Not on file  Transportation Needs:   . Lack of Transportation (Medical): Not on file  . Lack of Transportation (Non-Medical): Not on file  Physical Activity:   . Days of Exercise per Week: Not on file  . Minutes of Exercise per Session: Not on file  Stress:   . Feeling of Stress : Not on file  Social Connections:   . Frequency of Communication with Friends  and Family: Not on file  . Frequency of Social Gatherings with Friends and Family: Not on file  . Attends Religious Services: Not on file  . Active Member of Clubs or Organizations: Not on file  . Attends Archivist Meetings: Not on file  . Marital Status: Not on file    Outpatient Encounter Medications as of 10/01/2019  Medication Sig  . metoprolol tartrate (LOPRESSOR) 25 MG tablet Take 1 tablet by mouth twice daily   No facility-administered encounter medications on file as of 10/01/2019.    Activities of Daily Living In your present state of health, do you have any difficulty performing the following activities: 10/01/2019  Hearing? N  Vision? N  Difficulty concentrating or making decisions? N  Walking or climbing stairs? N  Dressing or bathing? N  Doing errands, shopping? N  Preparing Food and eating ? N  Using the Toilet? N  In the past six months, have you accidently leaked urine? N  Do you have problems with loss of bowel control? N  Managing your Medications? N  Managing your Finances? N  Housekeeping or managing your Housekeeping? N  Some recent data might be hidden    Patient Care Team: Fayrene Helper, MD as PCP - General (Family Medicine) Herminio Commons, MD as PCP - Cardiology (Cardiology) Lattie Haw Cristopher Estimable, MD as Attending Physician (Cardiology)   Assessment:   This is a routine wellness examination for [name].  Exercise Activities and Dietary recommendations Current Exercise Habits: Home exercise routine, Type of exercise: strength training/weights, Time (Minutes): 10, Frequency (Times/Week): 7, Weekly Exercise (Minutes/Week): 70, Intensity: Mild, Exercise limited by: None identified  Goals    . Quit using tobacco       Fall Risk Fall Risk  10/01/2019 04/06/2019 03/31/2019 10/07/2018 09/23/2018  Falls in the past year? 0 0 0 0 0  Number falls in past yr: 0 0 0 - 0  Injury with Fall? 0 0 0 0 0   Is the patient's home free of loose throw rugs  in walkways, pet beds, electrical cords, etc?   yes      Grab bars in the bathroom? yes      Handrails on the stairs?   yes      Adequate lighting?   yes     Depression Screen PHQ 2/9 Scores 10/01/2019 04/06/2019 03/31/2019 10/07/2018  PHQ - 2 Score 0 0 0 0    Cognitive Function     6CIT Screen 10/01/2019 09/23/2018 01/22/2017  What Year? 0 points 0 points 0 points  What month? 0 points 0 points 0 points  What time? 0 points 0 points 0 points  Count back from 20 0 points 0 points 0 points  Months in reverse 4 points 4 points 0 points  Repeat phrase 2 points 0 points 0 points  Total Score 6 4 0    Immunization History  Administered Date(s) Administered  . Influenza Whole 08/19/2007, 06/05/2010  . Influenza,inj,Quad PF,6+ Mos 06/01/2014, 06/27/2016, 09/23/2018, 05/10/2019  . Pneumococcal Conjugate-13 04/06/2014  . Td 02/08/2004  . Tdap 06/27/2016    Qualifies for Shingles Vaccine?  Checking coverage  Screening Tests Health Maintenance  Topic Date Due  . PNA vac Low Risk Adult (2 of 2 - PPSV23) 09/04/2019  . COLONOSCOPY  01/29/2023  . TETANUS/TDAP  06/27/2026  . INFLUENZA VACCINE  Completed  . Hepatitis C Screening  Completed  . HIV Screening  Completed   Cancer Screenings: Lung: Low Dose CT Chest recommended if Age 65-80 years, 30 pack-year currently smoking OR have quit w/in 15years. Patient does not qualify. Colorectal:  Due 2024  Additional Screenings:   Hepatitis C Screening: completed      Plan:       1. Encounter for Medicare annual wellness exam   I have personally reviewed and noted the following in the patient's chart:   . Medical and social history . Use of alcohol, tobacco or illicit drugs  . Current medications and supplements . Functional ability and status . Nutritional status . Physical activity . Advanced directives . List of other physicians . Hospitalizations, surgeries, and ER visits in previous 12 months . Vitals . Screenings to include  cognitive, depression, and falls . Referrals and appointments  In addition, I have reviewed and discussed with patient certain preventive protocols, quality metrics, and best practice recommendations. A written personalized care plan for preventive services as well as general preventive health recommendations were provided to patient.    I provided 20 minutes of non-face-to-face time during this encounter.   Perlie Mayo, NP  10/01/2019

## 2019-10-01 NOTE — Patient Instructions (Addendum)
Alexander Simmons , Thank you for taking time to come for your Medicare Wellness Visit. I appreciate your ongoing commitment to your health goals. Please review the following plan we discussed and let me know if I can assist you in the future.   Please continue to practice social distancing to keep you, your family, and our community safe.  If you must go out, please wear a Mask and practice good handwashing.  Screening recommendations/referrals: Colonoscopy: Due 2024 Recommended yearly ophthalmology/optometry visit for glaucoma screening and checkup Recommended yearly dental visit for hygiene and checkup  Vaccinations: Influenza vaccine: Up to date Pneumococcal vaccine: Due this year Tdap vaccine: Due 2027 Shingles vaccine: Check insurance coverage  Advanced directives: Please let us know if you would like help with this.  Conditions/risks identified: Falls   Next appointment: 04/24/2020 with Dr Moshe Cipro   Preventive Care 36 Years and Older, Male Preventive care refers to lifestyle choices and visits with your health care provider that can promote health and wellness. What does preventive care include?  A yearly physical exam. This is also called an annual well check.  Dental exams once or twice a year.  Routine eye exams. Ask your health care provider how often you should have your eyes checked.  Personal lifestyle choices, including:  Daily care of your teeth and gums.  Regular physical activity.  Eating a healthy diet.  Avoiding tobacco and drug use.  Limiting alcohol use.  Practicing safe sex.  Taking low doses of aspirin every day.  Taking vitamin and mineral supplements as recommended by your health care provider. What happens during an annual well check? The services and screenings done by your health care provider during your annual well check will depend on your age, overall health, lifestyle risk factors, and family history of disease. Counseling  Your health  care provider may ask you questions about your:  Alcohol use.  Tobacco use.  Drug use.  Emotional well-being.  Home and relationship well-being.  Sexual activity.  Eating habits.  History of falls.  Memory and ability to understand (cognition).  Work and work Statistician. Screening  You may have the following tests or measurements:  Height, weight, and BMI.  Blood pressure.  Lipid and cholesterol levels. These may be checked every 5 years, or more frequently if you are over 31 years old.  Skin check.  Lung cancer screening. You may have this screening every year starting at age 37 if you have a 30-pack-year history of smoking and currently smoke or have quit within the past 15 years.  Fecal occult blood test (FOBT) of the stool. You may have this test every year starting at age 103.  Flexible sigmoidoscopy or colonoscopy. You may have a sigmoidoscopy every 5 years or a colonoscopy every 10 years starting at age 44.  Prostate cancer screening. Recommendations will vary depending on your family history and other risks.  Hepatitis C blood test.  Hepatitis B blood test.  Sexually transmitted disease (STD) testing.  Diabetes screening. This is done by checking your blood sugar (glucose) after you have not eaten for a while (fasting). You may have this done every 1-3 years.  Abdominal aortic aneurysm (AAA) screening. You may need this if you are a current or former smoker.  Osteoporosis. You may be screened starting at age 45 if you are at high risk. Talk with your health care provider about your test results, treatment options, and if necessary, the need for more tests. Vaccines  Your health care  provider may recommend certain vaccines, such as:  Influenza vaccine. This is recommended every year.  Tetanus, diphtheria, and acellular pertussis (Tdap, Td) vaccine. You may need a Td booster every 10 years.  Zoster vaccine. You may need this after age  8.  Pneumococcal 13-valent conjugate (PCV13) vaccine. One dose is recommended after age 63.  Pneumococcal polysaccharide (PPSV23) vaccine. One dose is recommended after age 31. Talk to your health care provider about which screenings and vaccines you need and how often you need them. This information is not intended to replace advice given to you by your health care provider. Make sure you discuss any questions you have with your health care provider. Document Released: 09/08/2015 Document Revised: 05/01/2016 Document Reviewed: 06/13/2015 Elsevier Interactive Patient Education  2017 [place] Prevention in the Home Falls can cause injuries. They can happen to people of all ages. There are many things you can do to make your home safe and to help prevent falls. What can I do on the outside of my home?  Regularly fix the edges of walkways and driveways and fix any cracks.  Remove anything that might make you trip as you walk through a door, such as a raised step or threshold.  Trim any bushes or trees on the path to your home.  Use bright outdoor lighting.  Clear any walking paths of anything that might make someone trip, such as rocks or tools.  Regularly check to see if handrails are loose or broken. Make sure that both sides of any steps have handrails.  Any raised decks and porches should have guardrails on the edges.  Have any leaves, snow, or ice cleared regularly.  Use sand or salt on walking paths during winter.  Clean up any spills in your garage right away. This includes oil or grease spills. What can I do in the bathroom?  Use night lights.  Install grab bars by the toilet and in the tub and shower. Do not use towel bars as grab bars.  Use non-skid mats or decals in the tub or shower.  If you need to sit down in the shower, use a plastic, non-slip stool.  Keep the floor dry. Clean up any water that spills on the floor as soon as it happens.  Remove  soap buildup in the tub or shower regularly.  Attach bath mats securely with double-sided non-slip rug tape.  Do not have throw rugs and other things on the floor that can make you trip. What can I do in the bedroom?  Use night lights.  Make sure that you have a light by your bed that is easy to reach.  Do not use any sheets or blankets that are too big for your bed. They should not hang down onto the floor.  Have a firm chair that has side arms. You can use this for support while you get dressed.  Do not have throw rugs and other things on the floor that can make you trip. What can I do in the kitchen?  Clean up any spills right away.  Avoid walking on wet floors.  Keep items that you use a lot in easy-to-reach places.  If you need to reach something above you, use a strong step stool that has a grab bar.  Keep electrical cords out of the way.  Do not use floor polish or wax that makes floors slippery. If you must use wax, use non-skid floor wax.  Do not have throw  rugs and other things on the floor that can make you trip. What can I do with my stairs?  Do not leave any items on the stairs.  Make sure that there are handrails on both sides of the stairs and use them. Fix handrails that are broken or loose. Make sure that handrails are as long as the stairways.  Check any carpeting to make sure that it is firmly attached to the stairs. Fix any carpet that is loose or worn.  Avoid having throw rugs at the top or bottom of the stairs. If you do have throw rugs, attach them to the floor with carpet tape.  Make sure that you have a light switch at the top of the stairs and the bottom of the stairs. If you do not have them, ask someone to add them for you. What else can I do to help prevent falls?  Wear shoes that:  Do not have high heels.  Have rubber bottoms.  Are comfortable and fit you well.  Are closed at the toe. Do not wear sandals.  If you use a  stepladder:  Make sure that it is fully opened. Do not climb a closed stepladder.  Make sure that both sides of the stepladder are locked into place.  Ask someone to hold it for you, if possible.  Clearly mark and make sure that you can see:  Any grab bars or handrails.  First and last steps.  Where the edge of each step is.  Use tools that help you move around (mobility aids) if they are needed. These include:  Canes.  Walkers.  Scooters.  Crutches.  Turn on the lights when you go into a dark area. Replace any light bulbs as soon as they burn out.  Set up your furniture so you have a clear path. Avoid moving your furniture around.  If any of your floors are uneven, fix them.  If there are any pets around you, be aware of where they are.  Review your medicines with your doctor. Some medicines can make you feel dizzy. This can increase your chance of falling. Ask your doctor what other things that you can do to help prevent falls. This information is not intended to replace advice given to you by your health care provider. Make sure you discuss any questions you have with your health care provider. Document Released: 06/08/2009 Document Revised: 01/18/2016 Document Reviewed: 09/16/2014 Elsevier Interactive Patient Education  2017 [employer].

## 2019-11-02 ENCOUNTER — Other Ambulatory Visit: Payer: Self-pay

## 2019-11-02 ENCOUNTER — Ambulatory Visit: Payer: Medicare Other | Attending: Internal Medicine

## 2019-11-02 DIAGNOSIS — Z20822 Contact with and (suspected) exposure to covid-19: Secondary | ICD-10-CM

## 2019-11-03 LAB — NOVEL CORONAVIRUS, NAA: SARS-CoV-2, NAA: NOT DETECTED

## 2019-11-10 ENCOUNTER — Telehealth: Payer: Self-pay | Admitting: *Deleted

## 2019-11-10 NOTE — Telephone Encounter (Signed)
Patient called given negative civd results.

## 2020-01-15 ENCOUNTER — Emergency Department (HOSPITAL_COMMUNITY): Payer: Medicare Other

## 2020-01-15 ENCOUNTER — Emergency Department (HOSPITAL_COMMUNITY)
Admission: EM | Admit: 2020-01-15 | Discharge: 2020-01-16 | Disposition: A | Discharge status: Home / Self Care | Payer: Medicare Other | Attending: Emergency Medicine | Admitting: Emergency Medicine

## 2020-01-15 ENCOUNTER — Other Ambulatory Visit: Payer: Self-pay

## 2020-01-15 DIAGNOSIS — Y939 Activity, unspecified: Secondary | ICD-10-CM | POA: Insufficient documentation

## 2020-01-15 DIAGNOSIS — Y92009 Unspecified place in unspecified non-institutional (private) residence as the place of occurrence of the external cause: Secondary | ICD-10-CM | POA: Insufficient documentation

## 2020-01-15 DIAGNOSIS — W19XXXA Unspecified fall, initial encounter: Secondary | ICD-10-CM | POA: Insufficient documentation

## 2020-01-15 DIAGNOSIS — Y999 Unspecified external cause status: Secondary | ICD-10-CM | POA: Insufficient documentation

## 2020-01-15 DIAGNOSIS — S4992XA Unspecified injury of left shoulder and upper arm, initial encounter: Secondary | ICD-10-CM | POA: Diagnosis present

## 2020-01-15 DIAGNOSIS — F17220 Nicotine dependence, chewing tobacco, uncomplicated: Secondary | ICD-10-CM | POA: Diagnosis not present

## 2020-01-15 DIAGNOSIS — S299XXA Unspecified injury of thorax, initial encounter: Secondary | ICD-10-CM | POA: Diagnosis not present

## 2020-01-15 DIAGNOSIS — S42212A Unspecified displaced fracture of surgical neck of left humerus, initial encounter for closed fracture: Secondary | ICD-10-CM | POA: Diagnosis not present

## 2020-01-15 DIAGNOSIS — I10 Essential (primary) hypertension: Secondary | ICD-10-CM | POA: Insufficient documentation

## 2020-01-15 DIAGNOSIS — S42202A Unspecified fracture of upper end of left humerus, initial encounter for closed fracture: Secondary | ICD-10-CM | POA: Diagnosis not present

## 2020-01-15 DIAGNOSIS — S42292A Other displaced fracture of upper end of left humerus, initial encounter for closed fracture: Secondary | ICD-10-CM | POA: Diagnosis not present

## 2020-01-15 DIAGNOSIS — Z79899 Other long term (current) drug therapy: Secondary | ICD-10-CM | POA: Insufficient documentation

## 2020-01-15 NOTE — ED Triage Notes (Signed)
Pt c/o left shoulder pain after falling at home today.

## 2020-01-16 DIAGNOSIS — S42202A Unspecified fracture of upper end of left humerus, initial encounter for closed fracture: Secondary | ICD-10-CM | POA: Diagnosis not present

## 2020-01-16 MED ORDER — OXYCODONE-ACETAMINOPHEN 5-325 MG PO TABS: 1.0000 | ORAL_TABLET | ORAL | 0 refills | Status: DC | PRN

## 2020-01-16 MED ORDER — OXYCODONE-ACETAMINOPHEN 5-325 MG PO TABS
1.0000 | ORAL_TABLET | Freq: Once | ORAL | Status: AC
  Administered 2020-01-16: 1 via ORAL
  Filled 2020-01-16: qty 1

## 2020-01-16 NOTE — Discharge Instructions (Signed)
Apply ice for thirty minutes at a time, four times a day.  Call the orthopedic doctor on Monday May 24 to arrange follow-up.

## 2020-01-16 NOTE — ED Provider Notes (Signed)
[facility] EMERGENCY DEPARTMENT Provider Note   CSN: [id number] Arrival date & time: 01/15/20  2053   History Chief Complaint  Patient presents with  . Shoulder Pain    Alexander Simmons is a 64 y.o. male.  The history is provided by the patient.  Shoulder Pain He has history of hypertension, hyperlipidemia and comes in following a fall at home.  He injured his left shoulder in the fall.  He denies other injury.  He is not on any anticoagulants or antiplatelet agents.  Past Medical History:  Diagnosis Date  . Alcohol abuse   . Allergy   . Cor pulmonale (HCC)    By echocardiography  . Glaucoma   . Hyperlipidemia   . Hypertension   . Small bowel obstruction ([place])   . Syncope 12/2009   Attributed to use of narcotics plus alcohol  . Tobacco abuse     Patient Active Problem List   Diagnosis Date Noted  . Reduced vision 04/06/2019  . Acute gastroenteritis 03/31/2019  . Right inguinal hernia 10/07/2018  . Renal calculus, right 10/07/2018  . Overweight (BMI 25.0-29.9) 01/21/2016  . Annual physical exam 10/12/2014  . Hypertension, essential 04/08/2014  . Alcoholic cardiomyopathy ([place]) 04/08/2014  . Hyperlipidemia   . Tobacco abuse   . Palpitations 01/05/2013  . Alcohol dependence ([place]) 02/21/2010  . MENTAL RETARDATION, MILD 01/12/2008    Past Surgical History:  Procedure Laterality Date  . ABDOMINAL SURGERY    . COLONOSCOPY N/A 01/28/2013   Procedure: COLONOSCOPY;  Surgeon: Rogene Houston, MD;  [facility];  Service: Endoscopy;  Laterality: N/A;  1225  . PILONIDAL CYST EXCISION  2005   Dr. Irving Shows  . TOOTH EXTRACTION         Family History  Problem Relation Age of Onset  . Other Other        FH unknown-patient raised in foster care system  . Colon cancer Neg Hx     Social History   Tobacco Use  . Smoking status: Never Smoker  . Smokeless tobacco: Current User    Types: Snuff, Chew  . Tobacco comment: chews tobacco since age 30.  Substance  Use Topics  . Alcohol use: Yes    Alcohol/week: 20.0 standard drinks    Types: 20 Shots of liquor per week    Comment: 1 pint of gin daily (4cups daily)  . Drug use: No    Home Medications Prior to Admission medications   Medication Sig Start Date End Date Taking? Authorizing Provider  metoprolol tartrate (LOPRESSOR) 25 MG tablet Take 1 tablet by mouth twice daily 09/28/19   Fayrene Helper, MD  oxyCODONE-acetaminophen (PERCOCET) 5-325 MG tablet Take 1 tablet by mouth every 4 (four) hours as needed for moderate pain. [id number]   Delora Fuel, MD  oxyCODONE-acetaminophen (PERCOCET) 5-325 MG tablet Take 1 tablet by mouth every 4 (four) hours as needed for moderate pain. [id number]   Delora Fuel, MD    Allergies    Patient has no known allergies.  Review of Systems   Review of Systems  All other systems reviewed and are negative.   Physical Exam Updated Vital Signs BP 103/81   Pulse 60   Temp 98.5 F (36.9 C)   Resp 18   Ht 5\' 1"  (1.549 m)   Wt 67.1 kg   BMI 27.95 kg/m   Physical Exam Vitals and nursing note reviewed.   64 year old male, resting comfortably and in no acute distress.  Vital signs are normal.  Head is normocephalic and atraumatic. PERRLA, EOMI. Oropharynx is clear. Neck is nontender and supple without adenopathy or JVD. Back is nontender and there is no CVA tenderness. Lungs are clear without rales, wheezes, or rhonchi. Chest is nontender. Heart has regular rate and rhythm without murmur. Abdomen is soft, flat, nontender without masses or hepatosplenomegaly and peristalsis is normoactive. Extremities: There is moderate swelling around the left shoulder and marked pain with any passive range of movement.  Shoulder is noted to be grossly unstable.  Distal neurovascular exam is intact with strong pulses, prompt capillary refill, normal sensation, normal use of distal musculature. Skin is warm and dry without rash. Neurologic: Mental status is normal, cranial  nerves are intact, there are no motor or sensory deficits.  ED Results / Procedures / Treatments    Radiology DG Chest 1 View  Result Date: 01/15/2020 CLINICAL DATA:  [place], left humeral fracture EXAM: CHEST  1 VIEW COMPARISON:  08/26/2018 FINDINGS: Single frontal view of the chest demonstrates enlarged cardiac silhouette. No airspace disease, effusion, or pneumothorax. Comminuted proximal left humeral fracture is noted. IMPRESSION: 1. Comminuted displaced proximal left humeral fracture. 2. No acute intrathoracic process. 3. Enlarged cardiac silhouette. Electronically Signed   By: Randa Ngo M.D.   On: 01/15/2020 23:17   DG Shoulder Left  Result Date: 01/15/2020 CLINICAL DATA:  Status post fall. EXAM: LEFT SHOULDER - 2+ VIEW COMPARISON:  None. FINDINGS: An acute fracture deformity is seen involving the head and surgical neck of the proximal left humerus. Multiple fracture fragments are seen. Approximately 1-1/2 shaft width inferior displacement of the distal fracture site is noted. No dislocation is identified. There is no evidence of arthropathy or other focal bone abnormality. Diffuse soft tissue swelling is seen. IMPRESSION: Acute fracture of the proximal left humerus. Electronically Signed   By: Virgina Norfolk M.D.   On: 01/15/2020 23:09    Procedures Procedures   Medications Ordered in ED Medications  oxyCODONE-acetaminophen (PERCOCET/ROXICET) 5-325 MG per tablet 1 tablet (has no administration in time range)    ED Course  I have reviewed the triage vital signs and the nursing notes.  Pertinent imaging results that were available during my care of the patient were reviewed by me and considered in my medical decision making (see chart for details).  MDM Rules/Calculators/A&P Fall with injury to left shoulder.  X-rays show comminuted and displaced fracture of the proximal left humerus.  He is placed in a sling and discharged with prescription for oxycodone-acetaminophen, referred  to orthopedics for follow-up.  Old records are reviewed, and he does have a prior ED visit for a fall.  Final Clinical Impression(s) / ED Diagnoses Final diagnoses:  Closed traumatic displaced fracture of proximal end of left humerus, initial encounter    Rx / DC Orders ED Discharge Orders         Ordered    oxyCODONE-acetaminophen (PERCOCET) 5-325 MG tablet  Every 4 hours PRN     01/16/20 0013    oxyCODONE-acetaminophen (PERCOCET) 5-325 MG tablet  Every 4 hours PRN     01/16/20 [id number]           Delora Fuel, [place] [id number] [phone]

## 2020-01-18 ENCOUNTER — Other Ambulatory Visit: Payer: Self-pay

## 2020-01-18 ENCOUNTER — Other Ambulatory Visit: Payer: Self-pay | Admitting: Family Medicine

## 2020-01-18 ENCOUNTER — Ambulatory Visit (INDEPENDENT_AMBULATORY_CARE_PROVIDER_SITE_OTHER): Payer: Medicare Other | Admitting: Orthopedic Surgery

## 2020-01-18 ENCOUNTER — Encounter: Payer: Self-pay | Admitting: Orthopedic Surgery

## 2020-01-18 VITALS — BP 122/85 | HR 148 | Ht 63.0 in | Wt 148.0 lb

## 2020-01-18 DIAGNOSIS — S42292A Other displaced fracture of upper end of left humerus, initial encounter for closed fracture: Secondary | ICD-10-CM | POA: Diagnosis not present

## 2020-01-18 MED ORDER — HYDROCODONE-ACETAMINOPHEN 10-325 MG PO TABS: 1.0000 | ORAL_TABLET | ORAL | 0 refills | Status: DC | PRN

## 2020-01-18 MED FILL — Oxycodone w/ Acetaminophen Tab 5-325 MG: ORAL | Qty: 6 | Status: AC

## 2020-01-18 NOTE — Patient Instructions (Signed)
Continue to wear your sling  I ordered a new pain medication take 1 every 6 hours as needed

## 2020-01-18 NOTE — Progress Notes (Signed)
EMERGENCY ROOM FOLLOW UP  NEW PROBLEM/PATIENT   Patient ID: Alexander Simmons, male   DOB: 02/02/1956, 64 y.o.   MRN: [id number]  Emergency room record from (date) may 22  has been reviewed and this is included by reference and includes the review of systems with the following addition:   Chief Complaint  Patient presents with  . Shoulder Pain    left/ fall on Saturday     HPI Alexander Simmons is a 64 y.o. male.  Evaluation for left proximal humerus fracture  64 year old male with hypertension history of smoking presents after falling onto his left upper extremity injuring his left proximal humerus with a 100% displaced fracture  He is right-hand dominant  He denies any numbness or tingling   Review of Systems Review of Systems  No history of chest pain or shortness of breath   has a past medical history of Alcohol abuse, Allergy, Cor pulmonale ([place]), Glaucoma, Hyperlipidemia, Hypertension, Small bowel obstruction ([place]), Syncope (12/2009), and Tobacco abuse.   Past Surgical History:  Procedure Laterality Date  . ABDOMINAL SURGERY    . COLONOSCOPY N/A 01/28/2013   Procedure: COLONOSCOPY;  Surgeon: Rogene Houston, MD;  [facility];  Service: Endoscopy;  Laterality: N/A;  1225  . PILONIDAL CYST EXCISION  2005   Dr. Irving Shows  . TOOTH EXTRACTION      Family History  Problem Relation Age of Onset  . Other Other        FH unknown-patient raised in foster care system  . Colon cancer Neg Hx     Social History Social History   Tobacco Use  . Smoking status: Never Smoker  . Smokeless tobacco: Current User    Types: Snuff, Chew  . Tobacco comment: chews tobacco since age 61.  Substance Use Topics  . Alcohol use: Yes    Alcohol/week: 20.0 standard drinks    Types: 20 Shots of liquor per week    Comment: 1 pint of gin daily (4cups daily)  . Drug use: No    No Known Allergies  Current Outpatient Medications  Medication Sig Dispense Refill  . metoprolol tartrate  (LOPRESSOR) 25 MG tablet Take 1 tablet by mouth twice daily 180 tablet 0  . oxyCODONE-acetaminophen (PERCOCET) 5-325 MG tablet Take 1 tablet by mouth every 4 (four) hours as needed for moderate pain. 20 tablet 0  . oxyCODONE-acetaminophen (PERCOCET) 5-325 MG tablet Take 1 tablet by mouth every 4 (four) hours as needed for moderate pain. 6 tablet 0  . HYDROcodone-acetaminophen (NORCO) 10-325 MG tablet Take 1 tablet by mouth every 4 (four) hours as needed. 42 tablet 0   No current facility-administered medications for this visit.    Physical Exam BP 122/85   Pulse (!) 148   Ht 5\' 3"  (1.6 m)   Wt 148 lb (67.1 kg)   BMI 26.22 kg/m  Body mass index is 26.22 kg/m.  Well developed and well nourished  Stands with normal weight bearing line Alert and oriented x 3  Normal affect and mood  Ortho Exam Normal sensation lateral aspect of the arm neurovascular exam intact distally no other injuries tenderness over the proximal humerus mild swelling  Sling and swathe fitting appropriately  Data Reviewed IMAGING From THE ER AND THE REPORT ARE REVIEWED, MY INTERPRETATION OF THE IMAGE(S) IS :  100% displacement left proximal humerus with some comminution Assessment  Left proximal humerus fracture  Plan   Referral   Meds ordered this encounter  Medications  . HYDROcodone-acetaminophen (NORCO) 10-325 MG tablet    Sig: Take 1 tablet by mouth every 4 (four) hours as needed.    Dispense:  42 tablet    Refill:  0      Arther Abbott, MD 01/18/2020 2:26 PM

## 2020-01-18 NOTE — Addendum Note (Signed)
Addended byCandice Camp on: 01/18/2020 02:30 PM   Modules accepted: Orders

## 2020-01-22 ENCOUNTER — Other Ambulatory Visit: Payer: Self-pay | Admitting: Family Medicine

## 2020-01-25 ENCOUNTER — Other Ambulatory Visit: Payer: Self-pay | Admitting: *Deleted

## 2020-01-25 MED ORDER — METOPROLOL TARTRATE 25 MG PO TABS: 25.0000 mg | ORAL_TABLET | Freq: Two times a day (BID) | ORAL | 0 refills | Status: DC

## 2020-01-31 ENCOUNTER — Other Ambulatory Visit: Payer: Self-pay

## 2020-01-31 ENCOUNTER — Ambulatory Visit (INDEPENDENT_AMBULATORY_CARE_PROVIDER_SITE_OTHER): Payer: Medicare Other | Admitting: Orthopedic Surgery

## 2020-01-31 ENCOUNTER — Ambulatory Visit: Payer: Self-pay

## 2020-01-31 ENCOUNTER — Encounter: Payer: Self-pay | Admitting: Orthopedic Surgery

## 2020-01-31 DIAGNOSIS — M25512 Pain in left shoulder: Secondary | ICD-10-CM | POA: Diagnosis not present

## 2020-01-31 DIAGNOSIS — S42392A Other fracture of shaft of left humerus, initial encounter for closed fracture: Secondary | ICD-10-CM

## 2020-01-31 MED ORDER — OXYCODONE-ACETAMINOPHEN 5-325 MG PO TABS: 1.0000 | ORAL_TABLET | ORAL | 0 refills | Status: DC | PRN

## 2020-02-03 ENCOUNTER — Encounter: Payer: Self-pay | Admitting: Orthopedic Surgery

## 2020-02-03 NOTE — Progress Notes (Signed)
Office Visit Note   Patient: Alexander Simmons           Date of Birth: [birth year]           MRN: [id number] Visit Date: 01/31/2020 Requested by: Alexander Civil, MD [address] [place],  [place] [address] PCP: Alexander Helper, MD  Subjective: Chief Complaint  Patient presents with  . Left Shoulder - Pain    HPI: Alexander Simmons is a 64 year old patient with left shoulder pain.  Date of injury 01/15/2020.  He fell and landed on his left upper extremity.  Went to the emergency department which demonstrated proximal humerus fracture.  He has been in a sling since that time.  States that he did run out of pain medication.  He is right-hand dominant.  Stays with his aunt at home.  He is retired from physical type work.  He has difficulty with reading and request prescription refill.              ROS: All systems reviewed are negative as they relate to the chief complaint within the history of present illness.  Patient denies  fevers or chills.   Assessment & Plan: Visit Diagnoses:  1. Left shoulder pain, unspecified chronicity   2. Other closed fracture of shaft of left humerus, initial encounter     Plan: Impression is left proximal humerus fracture which is comminuted and which is not amenable to reconstructive plate fixation in standard open reduction internal fixation.  Unfortunately his axillary nerve function is diminished versus absent at this time.  I think he needs reverse shoulder replacement but that could be somewhat of a gamble depending on the function of his axillary nerve and whether or not it will come back from the initial traumatic injury.  Nonetheless thin cut CT scan of the shoulder for preoperative templating is ordered and we will also get nerve conduction study to evaluate that axillary nerve function. Follow-Up Instructions: Return for after MRI.   Orders:  Orders Placed This Encounter  Procedures  . XR Shoulder Left  . CT SHOULDER LEFT WO CONTRAST  .  Ambulatory referral to Physical Medicine Rehab   Meds ordered this encounter  Medications  . oxyCODONE-acetaminophen (PERCOCET) 5-325 MG tablet    Sig: Take 1 tablet by mouth every 4 (four) hours as needed for moderate pain.    Dispense:  24 tablet    Refill:  0      Procedures: No procedures performed   Clinical Data: No additional findings.  Objective: Vital Signs: There were no vitals taken for this visit.  Physical Exam:   Constitutional: Patient appears well-developed HEENT:  Head: Normocephalic Eyes:EOM are normal Neck: Normal range of motion Cardiovascular: Normal rate Pulmonary/chest: Effort normal Neurologic: Patient is alert Skin: Skin is warm Psychiatric: Patient has normal mood and affect    Ortho Exam: Ortho exam demonstrates intact EPL FPL interosseous function on that left hand.  Radial pulses intact.  Biceps triceps strength also intact but deltoid strength is significantly diminished.  No definite paresthesias in the lateral deltoid region left versus right.  Specialty Comments:  No specialty comments available.  Imaging: No results found.   PMFS History: Patient Active Problem List   Diagnosis Date Noted  . Reduced vision 04/06/2019  . Acute gastroenteritis 03/31/2019  . Right inguinal hernia 10/07/2018  . Renal calculus, right 10/07/2018  . Overweight (BMI 25.0-29.9) 01/21/2016  . Annual physical exam 10/12/2014  . Hypertension, essential 04/08/2014  . Alcoholic  cardiomyopathy ([place]) 04/08/2014  . Hyperlipidemia   . Tobacco abuse   . Palpitations 01/05/2013  . Alcohol dependence ([place]) 02/21/2010  . MENTAL RETARDATION, MILD 01/12/2008   Past Medical History:  Diagnosis Date  . Alcohol abuse   . Allergy   . Cor pulmonale ([facility])    By echocardiography  . Glaucoma   . Hyperlipidemia   . Hypertension   . Small bowel obstruction ([place])   . Syncope 12/2009   Attributed to use of narcotics plus alcohol  . Tobacco abuse     Family  History  Problem Relation Age of Onset  . Other Other        FH unknown-patient raised in foster care system  . Colon cancer Neg Hx     Past Surgical History:  Procedure Laterality Date  . ABDOMINAL SURGERY    . COLONOSCOPY N/A 01/28/2013   Procedure: COLONOSCOPY;  Surgeon: Rogene Houston, MD;  [facility];  Service: Endoscopy;  Laterality: N/A;  1225  . PILONIDAL CYST EXCISION  2005   Dr. Irving Shows  . TOOTH EXTRACTION     Social History   Occupational History  . Occupation: disabled   Tobacco Use  . Smoking status: Never Smoker  . Smokeless tobacco: Current User    Types: Snuff, Chew  . Tobacco comment: chews tobacco since age 81.  Substance and Sexual Activity  . Alcohol use: Yes    Alcohol/week: 20.0 standard drinks    Types: 20 Shots of liquor per week    Comment: 1 pint of gin daily (4cups daily)  . Drug use: No  . Sexual activity: Not Currently

## 2020-02-07 ENCOUNTER — Telehealth: Payer: Self-pay | Admitting: Orthopedic Surgery

## 2020-02-07 NOTE — Telephone Encounter (Signed)
Patient's cousin called.   They want to know if the patient's MRI can be done at [facility] in [place]   Call back: [phone]

## 2020-02-07 NOTE — Telephone Encounter (Signed)
Order has been sent to [facility], they will contact pt to schedule appt

## 2020-02-08 ENCOUNTER — Telehealth: Payer: Self-pay | Admitting: Orthopedic Surgery

## 2020-02-08 NOTE — Telephone Encounter (Signed)
Please advise. Thanks.  

## 2020-02-08 NOTE — Telephone Encounter (Signed)
Patient's cousin called on behalf of patient for refill of Hydrocodone. Patient spoke to me and gave permission for relative to make refill request on his behalf. Patient would like for refill to go to [facility] in [place]. Patient phone number is [phone].

## 2020-02-09 ENCOUNTER — Telehealth: Payer: Self-pay | Admitting: Orthopedic Surgery

## 2020-02-09 ENCOUNTER — Encounter: Payer: Self-pay | Admitting: Physical Medicine & Rehabilitation

## 2020-02-09 NOTE — Telephone Encounter (Signed)
Patient's cousin called on behalf of patient for refill of Hydrocodone. Patient spoke to me and gave permission for relative to make refill request on his behalf. Patient would like for refill to go to [facility] in [place]. Patient phone number is [phone].

## 2020-02-09 NOTE — Telephone Encounter (Signed)
See below

## 2020-02-10 ENCOUNTER — Other Ambulatory Visit: Payer: Self-pay | Admitting: Surgical

## 2020-02-10 ENCOUNTER — Telehealth: Payer: Self-pay | Admitting: Orthopedic Surgery

## 2020-02-10 MED ORDER — OXYCODONE-ACETAMINOPHEN 5-325 MG PO TABS: 1.0000 | ORAL_TABLET | Freq: Four times a day (QID) | ORAL | 0 refills | Status: DC | PRN

## 2020-02-10 NOTE — Telephone Encounter (Signed)
IC to advise. No answer. LMVM advising per below.

## 2020-02-10 NOTE — Telephone Encounter (Signed)
Patient came into the clinic requesting an RX refill on his Oxycodone.  [facility] uses [facility] in [place].  CB#[phone].  Thank you.

## 2020-02-10 NOTE — Telephone Encounter (Signed)
Pls advise.  

## 2020-02-16 ENCOUNTER — Ambulatory Visit (HOSPITAL_COMMUNITY)
Admission: RE | Admit: 2020-02-16 | Discharge: 2020-02-16 | Disposition: A | Discharge status: Home / Self Care | Payer: Medicare Other | Source: Ambulatory Visit | Attending: Orthopedic Surgery | Admitting: Orthopedic Surgery

## 2020-02-16 ENCOUNTER — Other Ambulatory Visit: Payer: Self-pay

## 2020-02-16 DIAGNOSIS — S42392A Other fracture of shaft of left humerus, initial encounter for closed fracture: Secondary | ICD-10-CM | POA: Insufficient documentation

## 2020-02-16 DIAGNOSIS — S42212A Unspecified displaced fracture of surgical neck of left humerus, initial encounter for closed fracture: Secondary | ICD-10-CM | POA: Diagnosis not present

## 2020-02-16 DIAGNOSIS — Z01818 Encounter for other preprocedural examination: Secondary | ICD-10-CM | POA: Diagnosis not present

## 2020-02-18 NOTE — Progress Notes (Signed)
Can uyou get him to come in mon for reval also when is fn seeing him?

## 2020-02-22 ENCOUNTER — Encounter: Payer: Medicare Other | Admitting: Physical Medicine & Rehabilitation

## 2020-02-24 ENCOUNTER — Ambulatory Visit (INDEPENDENT_AMBULATORY_CARE_PROVIDER_SITE_OTHER): Payer: Medicare Other | Admitting: Orthopedic Surgery

## 2020-02-24 ENCOUNTER — Other Ambulatory Visit: Payer: Self-pay | Admitting: Surgical

## 2020-02-24 ENCOUNTER — Telehealth: Payer: Self-pay

## 2020-02-24 DIAGNOSIS — S42392A Other fracture of shaft of left humerus, initial encounter for closed fracture: Secondary | ICD-10-CM

## 2020-02-24 DIAGNOSIS — M25512 Pain in left shoulder: Secondary | ICD-10-CM | POA: Diagnosis not present

## 2020-02-24 MED ORDER — OXYCODONE-ACETAMINOPHEN 5-325 MG PO TABS: 1.0000 | ORAL_TABLET | Freq: Four times a day (QID) | ORAL | 0 refills | Status: DC | PRN

## 2020-02-24 NOTE — Telephone Encounter (Signed)
Debbie, can you please send patients CT scan out to get plan made for [facility]  Per Dr Forbes Cellar request.  Lurena Joiner, Dr Marlou Sa asked if you could please submit a refill on his pain meds to pharmacy.

## 2020-02-25 ENCOUNTER — Telehealth: Payer: Self-pay | Admitting: Orthopedic Surgery

## 2020-02-25 ENCOUNTER — Other Ambulatory Visit: Payer: Self-pay | Admitting: Orthopedic Surgery

## 2020-02-25 MED ORDER — HYDROCODONE-ACETAMINOPHEN 5-325 MG PO TABS: 1.0000 | ORAL_TABLET | Freq: Four times a day (QID) | ORAL | 0 refills | Status: DC | PRN

## 2020-02-25 NOTE — Telephone Encounter (Signed)
I left voicemail for patient to return call to advise. Voicemail did not specify who's phone I was calling. Please advise of Dr. Randel Pigg message if he returns call.

## 2020-02-25 NOTE — Telephone Encounter (Signed)
Norco sent pls call him thx

## 2020-02-25 NOTE — Telephone Encounter (Signed)
Pt called stating the pharmacy doesn't have the medication called in, in stock and would like to know if there's something else the pt can take?   [phone]

## 2020-02-25 NOTE — Telephone Encounter (Signed)
Please advise. Luke sent in Oxycodone 5/325 yesterday.

## 2020-02-26 ENCOUNTER — Encounter: Payer: Self-pay | Admitting: Orthopedic Surgery

## 2020-02-26 NOTE — Progress Notes (Signed)
Office Visit Note   Patient: Alexander Simmons           Date of Birth: November 07, 1955           MRN: [id number] Visit Date: 02/24/2020 Requested by: Fayrene Helper, MD [address] [place],  [place] [address] PCP: Fayrene Helper, MD  Subjective: Chief Complaint  Patient presents with  . Follow-up    review scan    HPI: Alexander Simmons is a 64 year old patient with left shoulder proximal humerus fracture.  He has some axillary nerve dysfunction with that significant injury.  Since I have seen him he had a thin cut CT scan as preoperative plan for reverse shoulder replacement.  He has been taking some narcotic pain medicine.              ROS: All systems reviewed are negative as they relate to the chief complaint within the history of present illness.  Patient denies  fevers or chills.   Assessment & Plan: Visit Diagnoses:  1. Left shoulder pain, unspecified chronicity   2. Other closed fracture of shaft of left humerus, initial encounter     Plan: Impression is left shoulder severe proximal humerus fracture.  CT scan for preop planning has been done.  I do not see anything that is giving definite compression in and around the region of the axillary nerve.  However his axillary nerve continues to be dysfunctional.  EMG nerve study is pending.  I would also like to get an MRI scan of that left shoulder to evaluate for axillary nerve compression.  Difficultto commit to reverse shoulder replacement with the axillary nerve dysfunction as it is.  It is possible we could get a little bit better idea about the status of that nerve with further imaging.    Constitutional: Patient appears well-developed HEENT:  Head: Normocephalic Eyes:EOM are normal Neck: Normal range of motion Cardiovascular: Normal rate Pulmonary/chest: Effort normal Neurologic: Patient is alert Skin: Skin is warm Psychiatric: Patient has normal mood and affect    Follow-Up Instructions: Return for after MRI.    Orders:  Orders Placed This Encounter  Procedures  . MR Shoulder Left w/o contrast   No orders of the defined types were placed in this encounter.     Procedures: No procedures performed   Clinical Data: No additional findings.  Objective: Vital Signs: There were no vitals taken for this visit.  Physical Exam: Ortho Exam: Ortho exam demonstrates continued dysfunction of the axillary nerve.  Cannot really get him to fire his deltoid.  Does have paresthesias in the lateral arm on the left compared to the right.  Motor sensory function hand is otherwise intact.  Radial pulses intact.  No other masses lymphadenopathy or skin changes noted in that left shoulder girdle region.  The shoulder does feel located.  Specialty Comments:  No specialty comments available.  Imaging: No results found.   PMFS History: Patient Active Problem List   Diagnosis Date Noted  . Reduced vision 04/06/2019  . Acute gastroenteritis 03/31/2019  . Right inguinal hernia 10/07/2018  . Renal calculus, right 10/07/2018  . Overweight (BMI 25.0-29.9) 01/21/2016  . Annual physical exam 10/12/2014  . Hypertension, essential 04/08/2014  . Alcoholic cardiomyopathy ([place]) 04/08/2014  . Hyperlipidemia   . Tobacco abuse   . Palpitations 01/05/2013  . Alcohol dependence ([place]) 02/21/2010  . MENTAL RETARDATION, MILD 01/12/2008   Past Medical History:  Diagnosis Date  . Alcohol abuse   . Allergy   .  Cor pulmonale ([facility])    By echocardiography  . Glaucoma   . Hyperlipidemia   . Hypertension   . Small bowel obstruction ([place])   . Syncope 12/2009   Attributed to use of narcotics plus alcohol  . Tobacco abuse     Family History  Problem Relation Age of Onset  . Other Other        FH unknown-patient raised in foster care system  . Colon cancer Neg Hx     Past Surgical History:  Procedure Laterality Date  . ABDOMINAL SURGERY    . COLONOSCOPY N/A 01/28/2013   Procedure: COLONOSCOPY;  Surgeon: Rogene Houston, MD;  [facility];  Service: Endoscopy;  Laterality: N/A;  1225  . PILONIDAL CYST EXCISION  2005   Dr. Irving Shows  . TOOTH EXTRACTION     Social History   Occupational History  . Occupation: disabled   Tobacco Use  . Smoking status: Never Smoker  . Smokeless tobacco: Current User    Types: Snuff, Chew  . Tobacco comment: chews tobacco since age 50.  Substance and Sexual Activity  . Alcohol use: Yes    Alcohol/week: 20.0 standard drinks    Types: 20 Shots of liquor per week    Comment: 1 pint of gin daily (4cups daily)  . Drug use: No  . Sexual activity: Not Currently

## 2020-03-01 ENCOUNTER — Telehealth: Payer: Self-pay

## 2020-03-01 NOTE — Telephone Encounter (Signed)
Received fax from [facility] They are are out of oxy 5/325. Would you like to prescribe something different?

## 2020-03-01 NOTE — Telephone Encounter (Signed)
Tried calling, no answer. LMVM for them to call back to discuss.

## 2020-03-01 NOTE — Telephone Encounter (Signed)
Would like the prescription for oxy 5/325 sent to [facility] in [place] on [address]

## 2020-03-01 NOTE — Telephone Encounter (Signed)
I can RX Norco 10mg  to that pharmacy or RX oxycodone to another pharmacy, which is his preference

## 2020-03-02 ENCOUNTER — Other Ambulatory Visit: Payer: Self-pay | Admitting: Surgical

## 2020-03-02 MED ORDER — OXYCODONE-ACETAMINOPHEN 5-325 MG PO TABS: 1.0000 | ORAL_TABLET | Freq: Four times a day (QID) | ORAL | 0 refills | Status: DC | PRN

## 2020-03-02 NOTE — Telephone Encounter (Signed)
Submitted RX 

## 2020-03-12 ENCOUNTER — Ambulatory Visit
Admission: RE | Admit: 2020-03-12 | Discharge: 2020-03-12 | Disposition: A | Discharge status: Home / Self Care | Payer: Medicare Other | Source: Ambulatory Visit | Attending: Orthopedic Surgery | Admitting: Orthopedic Surgery

## 2020-03-12 ENCOUNTER — Other Ambulatory Visit: Payer: Self-pay

## 2020-03-12 DIAGNOSIS — S42202A Unspecified fracture of upper end of left humerus, initial encounter for closed fracture: Secondary | ICD-10-CM | POA: Diagnosis not present

## 2020-03-12 DIAGNOSIS — S42392A Other fracture of shaft of left humerus, initial encounter for closed fracture: Secondary | ICD-10-CM

## 2020-03-12 DIAGNOSIS — M25512 Pain in left shoulder: Secondary | ICD-10-CM

## 2020-03-12 DIAGNOSIS — R600 Localized edema: Secondary | ICD-10-CM | POA: Diagnosis not present

## 2020-03-14 ENCOUNTER — Telehealth: Payer: Self-pay | Admitting: Orthopedic Surgery

## 2020-03-14 ENCOUNTER — Ambulatory Visit: Payer: Medicare Other | Admitting: Physical Medicine & Rehabilitation

## 2020-03-14 NOTE — Telephone Encounter (Signed)
Patient's cousin called on his behalf.   They want to know if their appointment on Friday is still necessary. I was having a hard time understanding why she felt it might not be but I told her I would ask her providers.   Call back: [phone]

## 2020-03-14 NOTE — Telephone Encounter (Signed)
Can either of you advise? 

## 2020-03-15 ENCOUNTER — Telehealth: Payer: Self-pay | Admitting: Orthopedic Surgery

## 2020-03-15 NOTE — Telephone Encounter (Signed)
Please advise 

## 2020-03-15 NOTE — Telephone Encounter (Signed)
This is Dr Dean's patient

## 2020-03-15 NOTE — Telephone Encounter (Signed)
See below. Patients relative is his caregiver and typically calls on his behalf. I talked with her earlier today and he has had his MRI scan but still has EMG/NCV pending.

## 2020-03-15 NOTE — Telephone Encounter (Signed)
Dr Posey Pronto unable to do study but they referred him Dr Letta Pate. Appt is 03/30/20 for this EMG/NCV

## 2020-03-15 NOTE — Telephone Encounter (Signed)
Patient's relative called.   They are requesting a refill on the pain meds.   Call back: [phone]

## 2020-03-15 NOTE — Telephone Encounter (Signed)
Marin Shutter called asking an updated about should patient wait for appt until all appt from Dr. Posey Pronto. Or come in for MRI results. Please call patient at [phone]. Please call as soon as possible

## 2020-03-16 ENCOUNTER — Encounter: Payer: Medicare Other | Admitting: Physical Medicine & Rehabilitation

## 2020-03-16 ENCOUNTER — Ambulatory Visit: Payer: Medicare Other | Admitting: Orthopedic Surgery

## 2020-03-17 ENCOUNTER — Ambulatory Visit: Payer: Medicare Other | Admitting: Orthopedic Surgery

## 2020-03-17 ENCOUNTER — Other Ambulatory Visit: Payer: Self-pay | Admitting: Surgical

## 2020-03-17 MED ORDER — OXYCODONE-ACETAMINOPHEN 5-325 MG PO TABS: 1.0000 | ORAL_TABLET | Freq: Three times a day (TID) | ORAL | 0 refills | Status: DC | PRN

## 2020-03-17 NOTE — Telephone Encounter (Signed)
submitted

## 2020-03-17 NOTE — Telephone Encounter (Signed)
Pt advised.

## 2020-03-20 NOTE — Telephone Encounter (Signed)
Pls post for rsa  thx

## 2020-03-20 NOTE — Telephone Encounter (Signed)
Provided Dr Marlou Sa with blue sheet to complete. Once he has completed will give to [facility]

## 2020-03-30 ENCOUNTER — Encounter: Payer: Medicare Other | Attending: Physical Medicine & Rehabilitation | Admitting: Physical Medicine & Rehabilitation

## 2020-03-30 ENCOUNTER — Other Ambulatory Visit: Payer: Self-pay

## 2020-03-30 ENCOUNTER — Encounter: Payer: Self-pay | Admitting: Physical Medicine & Rehabilitation

## 2020-03-30 VITALS — BP 148/91 | HR 68 | Temp 98.7°F | Ht 63.0 in | Wt 143.0 lb

## 2020-03-30 DIAGNOSIS — R29898 Other symptoms and signs involving the musculoskeletal system: Secondary | ICD-10-CM | POA: Diagnosis not present

## 2020-03-30 NOTE — Patient Instructions (Addendum)
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  Please discuss results with Dr Marlou Sa.  No return visit needed with me.

## 2020-03-30 NOTE — Progress Notes (Signed)
EMG/NCV performed today Please refer to media for scanned report Briefly 64 year old male with left shoulder weakness following proximal humerus fracture. Examination demonstrates left shoulder abduction weakness as well as external rotation weakness. Axillary nerve conduction studies show a very low amplitude response on the left side and a normal response on the right side. Needle EMG demonstrates severe denervation with no intact motor unit potentials or signs of reinnervation in the left deltoid in the anterior middle and posterior heads. Normal findings left clavicular portion of pectoralis major, left biceps and left brachioradialis.

## 2020-04-06 ENCOUNTER — Ambulatory Visit (INDEPENDENT_AMBULATORY_CARE_PROVIDER_SITE_OTHER): Payer: Medicare Other | Admitting: Orthopedic Surgery

## 2020-04-06 DIAGNOSIS — S42392A Other fracture of shaft of left humerus, initial encounter for closed fracture: Secondary | ICD-10-CM

## 2020-04-06 MED ORDER — HYDROCODONE-ACETAMINOPHEN 5-325 MG PO TABS: 1.0000 | ORAL_TABLET | Freq: Two times a day (BID) | ORAL | 0 refills | Status: DC

## 2020-04-07 ENCOUNTER — Encounter: Payer: Self-pay | Admitting: Orthopedic Surgery

## 2020-04-07 NOTE — Progress Notes (Signed)
Post-Op Visit Note   Patient: Alexander Simmons           Date of Birth: 02-May-1956           MRN: [id number] Visit Date: 04/06/2020 PCP: Fayrene Helper, MD   Assessment & Plan:  Chief Complaint:  Chief Complaint  Patient presents with  . Follow-up   Visit Diagnoses:  1. Other closed fracture of shaft of left humerus, initial encounter     Plan: Alexander Simmons is a 64 year old patient with healed proximal left shoulder fracture with axillary nerve injuries present since the time of injury.  Since have seen him he had an EMG nerve study which confirmed axillary nerve injury.  Patient reports some pain with the shoulder but it is improving.  Denies any grinding.  On exam he has some forward flexion and abduction essentially from rotator cuff function but the deltoid still is not functional.  He is not in that much pain in terms of just functional use in every day living.  The options at this time would be reverse shoulder replacement in anticipation of axillary nerve return.  Other option is to repeat EMG nerve study in 2 to 3 months to see if there is any improvement.  I think it is a gamble to put a reverse replacement into his shoulder with no axillary nerve function in the hope that it would return.  I think that has a chance of making his current situation worse in terms of instability of the prosthesis.  His fracture has healed.  Axillary nerve function may or may not return.  I do not think that reverse shoulder replacement is indicated at this time based on his current level of symptoms.  He did ask for a refill on pain medicine which I did at a lesser frequency.  Follow-up in 2 months for clinical recheck and possible redoing of his nerve study.  Follow-Up Instructions: Return in about 8 weeks (around 06/01/2020).   Orders:  No orders of the defined types were placed in this encounter.  Meds ordered this encounter  Medications  . HYDROcodone-acetaminophen (NORCO/VICODIN) 5-325 MG tablet      Sig: Take 1 tablet by mouth in the morning and at bedtime.    Dispense:  30 tablet    Refill:  0    Imaging: No results found.  PMFS History: Patient Active Problem List   Diagnosis Date Noted  . Reduced vision 04/06/2019  . Acute gastroenteritis 03/31/2019  . Right inguinal hernia 10/07/2018  . Renal calculus, right 10/07/2018  . Overweight (BMI 25.0-29.9) 01/21/2016  . Annual physical exam 10/12/2014  . Hypertension, essential 04/08/2014  . Alcoholic cardiomyopathy ([place]) 04/08/2014  . Hyperlipidemia   . Tobacco abuse   . Palpitations 01/05/2013  . Alcohol dependence ([place]) 02/21/2010  . MENTAL RETARDATION, MILD 01/12/2008   Past Medical History:  Diagnosis Date  . Alcohol abuse   . Allergy   . Cor pulmonale ([facility])    By echocardiography  . Glaucoma   . Hyperlipidemia   . Hypertension   . Small bowel obstruction ([place])   . Syncope 12/2009   Attributed to use of narcotics plus alcohol  . Tobacco abuse     Family History  Problem Relation Age of Onset  . Other Other        FH unknown-patient raised in foster care system  . Colon cancer Neg Hx     Past Surgical History:  Procedure Laterality Date  . ABDOMINAL SURGERY    .  COLONOSCOPY N/A 01/28/2013   Procedure: COLONOSCOPY;  Surgeon: Rogene Houston, MD;  [facility];  Service: Endoscopy;  Laterality: N/A;  1225  . PILONIDAL CYST EXCISION  2005   Dr. Irving Shows  . TOOTH EXTRACTION     Social History   Occupational History  . Occupation: disabled   Tobacco Use  . Smoking status: Never Smoker  . Smokeless tobacco: Current User    Types: Snuff, Chew  . Tobacco comment: chews tobacco since age 35.  Vaping Use  . Vaping Use: Former  Substance and Sexual Activity  . Alcohol use: Yes    Alcohol/week: 20.0 standard drinks    Types: 20 Shots of liquor per week    Comment: 1 pint of gin daily (4cups daily)  . Drug use: No  . Sexual activity: Not Currently

## 2020-04-24 ENCOUNTER — Encounter: Payer: Medicare Other | Admitting: Family Medicine

## 2020-04-25 ENCOUNTER — Encounter: Payer: Medicare Other | Admitting: Family Medicine

## 2020-04-27 ENCOUNTER — Telehealth: Payer: Self-pay | Admitting: Orthopedic Surgery

## 2020-04-27 ENCOUNTER — Other Ambulatory Visit: Payer: Self-pay | Admitting: Surgical

## 2020-04-27 MED ORDER — HYDROCODONE-ACETAMINOPHEN 5-325 MG PO TABS: 1.0000 | ORAL_TABLET | Freq: Every day | ORAL | 0 refills | Status: DC | PRN

## 2020-04-27 NOTE — Telephone Encounter (Signed)
Will submit one last RX of Norco. If he requires continued pain medication after this point, recommend pain management.  Will not fill RX for 90 tablets

## 2020-04-27 NOTE — Telephone Encounter (Signed)
Please advise. Thanks.  

## 2020-04-27 NOTE — Telephone Encounter (Signed)
Alexander Simmons called requesting a refill of patient medication. Please send Hydrocodone refill of medication to pharmacy on fill. Alexander Simmons is asking for 75 quanity instead of 30 so he doesn't need to call every 30 days. Phone number is [phone]

## 2020-04-28 NOTE — Telephone Encounter (Signed)
I called Alexander Simmons and advised.

## 2020-05-02 ENCOUNTER — Telehealth: Payer: Self-pay

## 2020-05-02 NOTE — Telephone Encounter (Signed)
Wants to confirm the patients appts. [name] [phone]

## 2020-05-04 NOTE — Telephone Encounter (Signed)
Confirmed.

## 2020-05-11 ENCOUNTER — Ambulatory Visit (INDEPENDENT_AMBULATORY_CARE_PROVIDER_SITE_OTHER): Payer: Medicare Other | Admitting: Family Medicine

## 2020-05-11 ENCOUNTER — Encounter: Payer: Self-pay | Admitting: Family Medicine

## 2020-05-11 ENCOUNTER — Other Ambulatory Visit: Payer: Self-pay

## 2020-05-11 VITALS — BP 136/75 | HR 85 | Resp 16 | Ht 63.0 in | Wt 143.0 lb

## 2020-05-11 DIAGNOSIS — I10 Essential (primary) hypertension: Secondary | ICD-10-CM

## 2020-05-11 DIAGNOSIS — E559 Vitamin D deficiency, unspecified: Secondary | ICD-10-CM

## 2020-05-11 DIAGNOSIS — Z125 Encounter for screening for malignant neoplasm of prostate: Secondary | ICD-10-CM

## 2020-05-11 DIAGNOSIS — F17220 Nicotine dependence, chewing tobacco, uncomplicated: Secondary | ICD-10-CM | POA: Diagnosis not present

## 2020-05-11 DIAGNOSIS — Z23 Encounter for immunization: Secondary | ICD-10-CM | POA: Diagnosis not present

## 2020-05-11 DIAGNOSIS — F10288 Alcohol dependence with other alcohol-induced disorder: Secondary | ICD-10-CM

## 2020-05-11 DIAGNOSIS — E785 Hyperlipidemia, unspecified: Secondary | ICD-10-CM

## 2020-05-11 DIAGNOSIS — Z720 Tobacco use: Secondary | ICD-10-CM

## 2020-05-11 DIAGNOSIS — Z0000 Encounter for general adult medical examination without abnormal findings: Secondary | ICD-10-CM

## 2020-05-11 NOTE — Assessment & Plan Note (Signed)
Asked:confirms currently chews tobacco Assess: Unwilling to set a quit date, but is trying to cut back Advise: needs to QUIT to reduce risk of cancer, cardio and cerebrovascular disease Assist: counseled for 5 minutes and literature provided Arrange: follow up in 2 to 4 months

## 2020-05-11 NOTE — Patient Instructions (Signed)
F/u in office with m D in 6 months, re evaluate nicotine and alcohol depenedence, call if you need me sooner  Flu and prevnar vaccines in office today  Nurse please retrieve documentation from [place] re covid vaccines earlier this year , pt reports getting them but is unable to recall the months ( if able)  Fasting CBC, lipid, cmp and eGFr, TSH, PSA , and vit D this week tomorrow  VITAL that you commit to and really work on quitting both alcohol and nicotine use to improve quality of and lengthen duration of life  It is important that you exercise regularly at least 30 minutes 5 times a week. If you develop chest pain, have severe difficulty breathing, or feel very tired, stop exercising immediately and seek medical attention    Thanks for choosing [facility], we consider it a privelige to serve you.

## 2020-05-11 NOTE — Assessment & Plan Note (Signed)
counselled re need to quit alcohol, recurrent " accidents" likely due to intoxication

## 2020-05-11 NOTE — Progress Notes (Signed)
   Alexander Simmons     MRN: [id number]      DOB: May 10, 1956   HPI: Patient is in for annual physical exam. Nicotine and alcohol dependence are discussed . Immunization is reviewed , and  updated     PE; BP 136/75   Pulse 85   Resp 16   Ht 5\' 3"  (1.6 m)   Wt 143 lb (64.9 kg)   SpO2 97%   BMI 25.33 kg/m   Pleasant male, alert and oriented x 3, in no cardio-pulmonary distress. Afebrile. HEENT No facial trauma or asymetry. Sinuses non tender. EOMI External ears normal,  Neck: supple, no adenopathy,JVD or thyromegaly.No bruits.  Chest: Clear to ascultation bilaterally.No crackles or wheezes. Non tender to palpation  Cardiovascular system; Heart sounds normal,  S1 and  S2 ,no S3.  No murmur, or thrill. Apical beat not displaced Peripheral pulses normal.  Abdomen: Soft, non tender, no organomegaly or masses. No bruits. Bowel sounds normal. No guarding, tenderness or rebound.    Musculoskeletal exam: Full ROM of spine, hips ,  and knees.Markedly reduced ROM left shoulder No deformity ,swelling or crepitus noted. No muscle wasting or atrophy.   Neurologic: Cranial nerves 2 to 12 intact. Power, tone ,sensation and reflexes normal throughout. No disturbance in gait. No tremor.  Skin: Intact, no ulceration, erythema , scaling or rash noted. Pigmentation normal throughout  Psych; Normal mood and affect. Judgement and concentration normal   Assessment & Plan:  Tobacco abuse Asked:confirms currently chews tobacco Assess: Unwilling to set a quit date, but is trying to cut back Advise: needs to QUIT to reduce risk of cancer, cardio and cerebrovascular disease Assist: counseled for 5 minutes and literature provided Arrange: follow up in 2 to 4 months   Annual physical exam Annual exam as documented. Counseling done  re healthy lifestyle involving commitment to 150 minutes exercise per week, heart healthy diet, and attaining healthy weight.The importance of  adequate sleep also discussed. Regular seat belt use and home safety, is also discussed. Changes in health habits are decided on by the patient with goals and time frames  set for achieving them. Immunization and cancer screening needs are specifically addressed at this visit.   Alcohol dependence counselled re need to quit alcohol, recurrent " accidents" likely due to intoxication

## 2020-05-11 NOTE — Assessment & Plan Note (Signed)

## 2020-05-12 ENCOUNTER — Encounter: Payer: Self-pay | Admitting: Family Medicine

## 2020-05-17 ENCOUNTER — Other Ambulatory Visit: Payer: Self-pay | Admitting: Family Medicine

## 2020-05-17 DIAGNOSIS — E559 Vitamin D deficiency, unspecified: Secondary | ICD-10-CM | POA: Diagnosis not present

## 2020-05-17 DIAGNOSIS — I10 Essential (primary) hypertension: Secondary | ICD-10-CM | POA: Diagnosis not present

## 2020-05-17 DIAGNOSIS — E785 Hyperlipidemia, unspecified: Secondary | ICD-10-CM | POA: Diagnosis not present

## 2020-05-18 ENCOUNTER — Other Ambulatory Visit: Payer: Self-pay | Admitting: Family Medicine

## 2020-05-18 LAB — CBC
HCT: 37.2 % — ABNORMAL LOW (ref 38.5–50.0)
Hemoglobin: 12.5 g/dL — ABNORMAL LOW (ref 13.2–17.1)
MCH: 33.4 pg — ABNORMAL HIGH (ref 27.0–33.0)
MCHC: 33.6 g/dL (ref 32.0–36.0)
MCV: 99.5 fL (ref 80.0–100.0)
MPV: 10.2 fL (ref 7.5–12.5)
Platelets: 246 10*3/uL (ref 140–400)
RBC: 3.74 10*6/uL — ABNORMAL LOW (ref 4.20–5.80)
RDW: 14.7 % (ref 11.0–15.0)
WBC: 3.7 10*3/uL — ABNORMAL LOW (ref 3.8–10.8)

## 2020-05-18 LAB — COMPLETE METABOLIC PANEL WITH GFR
AG Ratio: 1.4 (calc) (ref 1.0–2.5)
ALT: 9 U/L (ref 9–46)
AST: 20 U/L (ref 10–35)
Albumin: 3.8 g/dL (ref 3.6–5.1)
Alkaline phosphatase (APISO): 104 U/L (ref 35–144)
BUN: 9 mg/dL (ref 7–25)
CO2: 20 mmol/L (ref 20–32)
Calcium: 9.2 mg/dL (ref 8.6–10.3)
Chloride: 107 mmol/L (ref 98–110)
Creat: 1.03 mg/dL (ref 0.70–1.25)
GFR, Est African American: 88 mL/min/{1.73_m2} (ref >=60)
GFR, Est Non African American: 76 mL/min/{1.73_m2} (ref >=60)
Globulin: 2.8 g/dL (calc) (ref 1.9–3.7)
Glucose, Bld: 104 mg/dL — ABNORMAL HIGH (ref 65–99)
Potassium: 3.7 mmol/L (ref 3.5–5.3)
Sodium: 140 mmol/L (ref 135–146)
Total Bilirubin: 0.8 mg/dL (ref 0.2–1.2)
Total Protein: 6.6 g/dL (ref 6.1–8.1)

## 2020-05-18 LAB — TSH: TSH: 2.32 mIU/L (ref 0.40–4.50)

## 2020-05-18 LAB — PSA: PSA: 1.61 ng/mL (ref <=4.0)

## 2020-05-18 LAB — LIPID PANEL
Cholesterol: 156 mg/dL (ref <200)
HDL: 59 mg/dL (ref >=40)
LDL Cholesterol (Calc): 68 mg/dL (calc)
Non-HDL Cholesterol (Calc): 97 mg/dL (calc) (ref <130)
Total CHOL/HDL Ratio: 2.6 (calc) (ref <5.0)
Triglycerides: 234 mg/dL — ABNORMAL HIGH (ref <150)

## 2020-05-18 LAB — VITAMIN D 25 HYDROXY (VIT D DEFICIENCY, FRACTURES): Vit D, 25-Hydroxy: 11 ng/mL — ABNORMAL LOW (ref 30–100)

## 2020-05-18 MED ORDER — ERGOCALCIFEROL 1.25 MG (50000 UT) PO CAPS: 50000.0000 [IU] | ORAL_CAPSULE | ORAL | 1 refills | Status: DC

## 2020-06-07 ENCOUNTER — Ambulatory Visit (INDEPENDENT_AMBULATORY_CARE_PROVIDER_SITE_OTHER): Payer: Medicare Other | Admitting: Orthopedic Surgery

## 2020-06-07 DIAGNOSIS — S42392A Other fracture of shaft of left humerus, initial encounter for closed fracture: Secondary | ICD-10-CM | POA: Diagnosis not present

## 2020-06-07 DIAGNOSIS — M25512 Pain in left shoulder: Secondary | ICD-10-CM | POA: Diagnosis not present

## 2020-06-08 ENCOUNTER — Encounter: Payer: Self-pay | Admitting: Orthopedic Surgery

## 2020-06-08 NOTE — Progress Notes (Signed)
Office Visit Note   Patient: Alexander Simmons           Date of Birth: 1956/06/16           MRN: [id number] Visit Date: 06/07/2020 Requested by: Fayrene Helper, MD [address] [place],  [place] [address] PCP: Fayrene Helper, MD  Subjective: Chief Complaint  Patient presents with  . Follow-up    HPI: Alexander Simmons is a 64 year old patient is about 3 to 4 months out left proximal humerus fracture.  Has axillary nerve dysfunction associated with that injury.  States that he does not have much pain but he has been having loss of function.              ROS: All systems reviewed are negative as they relate to the chief complaint within the history of present illness.  Patient denies  fevers or chills.   Assessment & Plan: Visit Diagnoses:  1. Other closed fracture of shaft of left humerus, initial encounter   2. Left shoulder pain, unspecified chronicity     Plan: Impression is healed left proximal humerus fracture with axillary nerve dysfunction.  Plan is repeat nerve study to see what change there is been in 6 to 8 weeks and function of the axillary nerve.  I think with his function at this time he has a painless but relatively limited function shoulder joint.  I think that is a better option than a painful shoulder joint with potential for instability of the prosthesis.  We will see what the nerve study shows and make plans from there.  Follow-Up Instructions: No follow-ups on file.   Orders:  Orders Placed This Encounter  Procedures  . Ambulatory referral to Physical Medicine Rehab   No orders of the defined types were placed in this encounter.     Procedures: No procedures performed   Clinical Data: No additional findings.  Objective: Vital Signs: There were no vitals taken for this visit.  Physical Exam:   Constitutional: Patient appears well-developed HEENT:  Head: Normocephalic Eyes:EOM are normal Neck: Normal range of motion Cardiovascular:  Normal rate Pulmonary/chest: Effort normal Neurologic: Patient is alert Skin: Skin is warm Psychiatric: Patient has normal mood and affect    Ortho Exam: Ortho exam demonstrates absence of deltoid function on the left.  Triceps biceps and wrist flexion extension is intact.  Radial pulse intact on the left-hand side.  Passive range of motion somewhat limited to below 90 degrees of forward flexion abduction.  Specialty Comments:  No specialty comments available.  Imaging: No results found.   PMFS History: Patient Active Problem List   Diagnosis Date Noted  . Reduced vision 04/06/2019  . Right inguinal hernia 10/07/2018  . Renal calculus, right 10/07/2018  . Overweight (BMI 25.0-29.9) 01/21/2016  . Annual physical exam 10/12/2014  . Hypertension, essential 04/08/2014  . Alcoholic cardiomyopathy ([place]) 04/08/2014  . Hyperlipidemia   . Tobacco abuse   . Palpitations 01/05/2013  . Alcohol dependence ([place]) 02/21/2010  . MENTAL RETARDATION, MILD 01/12/2008   Past Medical History:  Diagnosis Date  . Alcohol abuse   . Allergy   . Cor pulmonale (HCC)    By echocardiography  . Glaucoma   . Hyperlipidemia   . Hypertension   . Small bowel obstruction ([place])   . Syncope 12/2009   Attributed to use of narcotics plus alcohol  . Tobacco abuse     Family History  Problem Relation Age of Onset  . Other  Other        FH unknown-patient raised in foster care system  . Colon cancer Neg Hx     Past Surgical History:  Procedure Laterality Date  . ABDOMINAL SURGERY    . COLONOSCOPY N/A 01/28/2013   Procedure: COLONOSCOPY;  Surgeon: Rogene Houston, MD;  [facility];  Service: Endoscopy;  Laterality: N/A;  1225  . PILONIDAL CYST EXCISION  2005   Dr. Irving Shows  . TOOTH EXTRACTION     Social History   Occupational History  . Occupation: disabled   Tobacco Use  . Smoking status: Never Smoker  . Smokeless tobacco: Current User    Types: Snuff, Chew  . Tobacco comment:  chews tobacco since age 59.  Vaping Use  . Vaping Use: Former  Substance and Sexual Activity  . Alcohol use: Yes    Alcohol/week: 20.0 standard drinks    Types: 20 Shots of liquor per week    Comment: 1 pint of gin daily (4cups daily)  . Drug use: No  . Sexual activity: Not Currently

## 2020-07-04 ENCOUNTER — Encounter: Payer: Self-pay | Admitting: Physical Medicine & Rehabilitation

## 2020-07-04 ENCOUNTER — Encounter: Payer: Medicare Other | Attending: Physical Medicine & Rehabilitation | Admitting: Physical Medicine & Rehabilitation

## 2020-07-04 ENCOUNTER — Other Ambulatory Visit: Payer: Self-pay

## 2020-07-04 VITALS — BP 171/99 | HR 81 | Temp 98.9°F | Ht 63.0 in | Wt 143.2 lb

## 2020-07-04 DIAGNOSIS — G5690 Unspecified mononeuropathy of unspecified upper limb: Secondary | ICD-10-CM

## 2020-07-04 DIAGNOSIS — G5692 Unspecified mononeuropathy of left upper limb: Secondary | ICD-10-CM | POA: Diagnosis not present

## 2020-07-04 NOTE — Patient Instructions (Addendum)
No changes since last EMG study  Please make appt to see Dr Marlou Sa for follow up

## 2020-07-05 NOTE — Progress Notes (Signed)
64 year old male returns for repeat EMG/NCV to evaluate left upper extremity shoulder weakness. Previous EMG/NCV showed a complete axillary neuropathy. Repeat study today shows no change compared to 03/30/2020. Please refer to media tab for full report. Patient will follow up with orthopedics, Dr Marlou Sa

## 2020-07-11 ENCOUNTER — Encounter: Payer: Self-pay | Admitting: Physical Medicine & Rehabilitation

## 2020-08-03 ENCOUNTER — Encounter: Payer: Self-pay | Admitting: Orthopedic Surgery

## 2020-08-03 ENCOUNTER — Ambulatory Visit (INDEPENDENT_AMBULATORY_CARE_PROVIDER_SITE_OTHER): Payer: Medicare Other | Admitting: Orthopedic Surgery

## 2020-08-03 DIAGNOSIS — M25512 Pain in left shoulder: Secondary | ICD-10-CM

## 2020-08-03 NOTE — Progress Notes (Signed)
Office Visit Note   Patient: Alexander Simmons           Date of Birth: May 08, 1956           MRN: [id number] Visit Date: 08/03/2020 Requested by: Alexander Helper, Simmons [address], [place],  [place] [address] PCP: Alexander Helper, Simmons  Subjective: Chief Complaint  Patient presents with  . Left Shoulder - Pain    HPI: Alexander Simmons is a 64 year old patient with left proximal humerus fracture sustained several months ago.  Had axillary nerve injury at the time of our evaluation.  That nerve injury has not improved.  Since he was last seen he has had a 2nd nerve conduction study which Simmons no return of function of the axillary nerve.  The patient is not in pain.  He does have function of the left arm from the mid humeral region down but cannot really lift the arm itself.  Right arm is fully functional.  EMG nerve study from Alexander Simmons is reviewed from late October.              ROS: All systems reviewed are negative as they relate to the chief complaint within the history of present illness.  Patient denies  fevers or chills.   Assessment & Plan: Visit Diagnoses:  1. Left shoulder pain, unspecified chronicity     Plan: Impression is left shoulder proximal humerus fracture which has healed.  Shoulder is located.  Axillary nerve injury persists.  I do not think this is going to improve as there has been no improvement in the nerve conduction studies over 2 sequential studies.  This is something that he can live with.  He is not in pain.  Reverse replacement not indicated due to absence of axillary nerve function.  Follow-up as needed.  Follow-Up Instructions: Return if symptoms worsen or fail to improve.   Orders:  No orders of the defined types were placed in this encounter.  No orders of the defined types were placed in this encounter.     Procedures: No procedures performed   Clinical Data: No additional findings.  Objective: Vital Signs: There were no vitals taken  for this visit.  Physical Exam:   Constitutional: Patient appears well-developed HEENT:  Head: Normocephalic Eyes:EOM are normal Neck: Normal range of motion Cardiovascular: Normal rate Pulmonary/chest: Effort normal Neurologic: Patient is alert Skin: Skin is warm Psychiatric: Patient has normal mood and affect    Ortho Exam: Ortho exam demonstrates intact radial pulses bilaterally.  Deltoid does not fire on the left-hand side.  Biceps is 5- out of 5 on the right 5 out of 5 on the left.  Triceps is 5+ out of 5 bilaterally.  EPL FPL interosseous strength intact bilaterally.  No real pain with passive range of motion of the left shoulder.  Specialty Comments:  No specialty comments available.  Imaging: No results found.   PMFS History: Patient Active Problem List   Diagnosis Date Noted  . Reduced vision 04/06/2019  . Right inguinal hernia 10/07/2018  . Renal calculus, right 10/07/2018  . Overweight (BMI 25.0-29.9) 01/21/2016  . Annual physical exam 10/12/2014  . Hypertension, essential 04/08/2014  . Alcoholic cardiomyopathy ([place]) 04/08/2014  . Hyperlipidemia   . Tobacco abuse   . Palpitations 01/05/2013  . Alcohol dependence ([place]) 02/21/2010  . MENTAL RETARDATION, MILD 01/12/2008   Past Medical History:  Diagnosis Date  . Alcohol abuse   . Allergy   . Cor pulmonale (  [place])    By echocardiography  . Glaucoma   . Hyperlipidemia   . Hypertension   . Small bowel obstruction ([place])   . Syncope 12/2009   Attributed to use of narcotics plus alcohol  . Tobacco abuse     Family History  Problem Relation Age of Onset  . Other Other        FH unknown-patient raised in foster care system  . Colon cancer Neg Hx     Past Surgical History:  Procedure Laterality Date  . ABDOMINAL SURGERY    . COLONOSCOPY N/A 01/28/2013   Procedure: COLONOSCOPY;  Surgeon: Alexander Houston, Simmons;  [facility];  Service: Endoscopy;  Laterality: N/A;  1225  . PILONIDAL CYST EXCISION   2005   Dr. Irving Simmons  . TOOTH EXTRACTION     Social History   Occupational History  . Occupation: disabled   Tobacco Use  . Smoking status: Never Smoker  . Smokeless tobacco: Current User    Types: Snuff, Chew  . Tobacco comment: chews tobacco since age 59.  Vaping Use  . Vaping Use: Former  Substance and Sexual Activity  . Alcohol use: Yes    Alcohol/week: 20.0 standard drinks    Types: 20 Shots of liquor per week    Comment: 1 pint of gin daily (4cups daily)  . Drug use: No  . Sexual activity: Not Currently

## 2020-08-09 ENCOUNTER — Telehealth: Payer: Self-pay

## 2020-08-09 DIAGNOSIS — M25512 Pain in left shoulder: Secondary | ICD-10-CM

## 2020-08-09 NOTE — Telephone Encounter (Signed)
Ok for order?  

## 2020-08-09 NOTE — Telephone Encounter (Signed)
Patient called she stated she would like a referral sent for physical therapy to a office in [place] location [address] [place]  [phone]

## 2020-08-10 NOTE — Telephone Encounter (Signed)
Sounds good. Thanks 

## 2020-08-10 NOTE — Addendum Note (Signed)
Addended by: Meyer Cory on: 08/10/2020 03:35 PM   Modules accepted: Orders

## 2020-08-10 NOTE — Telephone Encounter (Signed)
Order for OT at [facility] entered.

## 2020-08-31 ENCOUNTER — Other Ambulatory Visit: Payer: Self-pay | Admitting: Family Medicine

## 2020-08-31 ENCOUNTER — Telehealth: Payer: Self-pay

## 2020-08-31 NOTE — Telephone Encounter (Signed)
This medication was sent to the pharmacy today.

## 2020-08-31 NOTE — Telephone Encounter (Signed)
metoprolol tartrate (LOPRESSOR) 25 MG tablet  90 day supply

## 2020-09-05 ENCOUNTER — Other Ambulatory Visit: Payer: Self-pay

## 2020-09-05 ENCOUNTER — Ambulatory Visit (HOSPITAL_COMMUNITY): Payer: Medicare Other | Attending: Orthopedic Surgery | Admitting: Occupational Therapy

## 2020-09-05 ENCOUNTER — Encounter (HOSPITAL_COMMUNITY): Payer: Self-pay | Admitting: Occupational Therapy

## 2020-09-05 DIAGNOSIS — R29898 Other symptoms and signs involving the musculoskeletal system: Secondary | ICD-10-CM | POA: Diagnosis not present

## 2020-09-05 DIAGNOSIS — M25512 Pain in left shoulder: Secondary | ICD-10-CM | POA: Diagnosis not present

## 2020-09-05 DIAGNOSIS — G8929 Other chronic pain: Secondary | ICD-10-CM | POA: Insufficient documentation

## 2020-09-05 DIAGNOSIS — M25612 Stiffness of left shoulder, not elsewhere classified: Secondary | ICD-10-CM | POA: Diagnosis not present

## 2020-09-05 NOTE — Therapy (Signed)
[place], [place], [address] Phone: [phone]   Fax:  [phone]  Occupational Therapy Evaluation  Patient Details  Name: Alexander Simmons MRN: [id number] Date of Birth: 1956-03-02 Referring Provider (OT): Dr. Meredith Pel   Encounter Date: 09/05/2020   OT End of Session - 09/05/20 1423    Visit Number 1    Number of Visits 8    Date for OT Re-Evaluation 10/05/20    Authorization Type [employer] Medicare    Progress Note Due on Visit 10    OT Start Time 1348    OT Stop Time 1415    OT Time Calculation (min) 27 min    Activity Tolerance Patient tolerated treatment well    Behavior During Therapy [facility] for tasks assessed/performed           Past Medical History:  Diagnosis Date  . Alcohol abuse   . Allergy   . Cor pulmonale (HCC)    By echocardiography  . Glaucoma   . Hyperlipidemia   . Hypertension   . Small bowel obstruction ([place])   . Syncope 12/2009   Attributed to use of narcotics plus alcohol  . Tobacco abuse     Past Surgical History:  Procedure Laterality Date  . ABDOMINAL SURGERY    . COLONOSCOPY N/A 01/28/2013   Procedure: COLONOSCOPY;  Surgeon: Rogene Houston, MD;  [facility];  Service: Endoscopy;  Laterality: N/A;  1225  . PILONIDAL CYST EXCISION  2005   Dr. Irving Shows  . TOOTH EXTRACTION      There were no vitals filed for this visit.   Subjective Assessment - 09/05/20 1415    Subjective  S: I broke it a while back.    Pertinent History Pt is a 65 y/o male s/p left proximal humerus fx with axillary nerve injury on 01/15/20. Pt has had 2 nerve conduction studies without improvement in axillary nerve functioning that is limiting ability to use LUE during daily tasks. Pt was referred to occupational therapy for evaluation and treatment by Dr. Meredith Pel.    Currently in Pain? No/denies             [facility] OT Assessment - 09/05/20 1352      Assessment   Medical  Diagnosis s/p left proximal humerus fx with axillary nerve injury    Referring Provider (OT) Dr. Meredith Pel    Onset Date/Surgical Date 01/15/20    Hand Dominance Right    Next MD Visit None    Prior Therapy None      Precautions   Precautions None      Restrictions   Weight Bearing Restrictions No      Balance Screen   Has the patient fallen in the past 6 months No      Prior Function   Level of Independence Independent    Vocation Retired    Leisure watching TV, mowing the yard, walking dog      ADL   ADL comments Pt is having difficulty dressing, lifting arm, reaching behind back. Pt is unable to lift any items with the LUE      Written Expression   Dominant Hand Right      Observation/Other Assessments   Focus on Therapeutic Outcomes (FOTO)  62/100      ROM / Strength   AROM / PROM / Strength Strength;PROM;AROM      Palpation   Palpation comment min/mod fascial  restrictions in left upper arm and anterior shoulder, trapezius regions      AROM   Overall AROM Comments Assessed seated, er/IR adducted    AROM Assessment Site Shoulder    Right/Left Shoulder Left    Left Shoulder Flexion 77 Degrees    Left Shoulder ABduction 44 Degrees    Left Shoulder Internal Rotation 90 Degrees    Left Shoulder External Rotation 14 Degrees      PROM   Overall PROM Comments Assessed supine, er/IR adducted    PROM Assessment Site Shoulder    Right/Left Shoulder Left    Left Shoulder Flexion 108 Degrees    Left Shoulder ABduction 91 Degrees    Left Shoulder Internal Rotation 90 Degrees    Left Shoulder External Rotation 35 Degrees      Strength   Overall Strength Comments Assessed seated, er/IR adducted    Strength Assessment Site Shoulder    Right/Left Shoulder Left    Left Shoulder Flexion 3-/5    Left Shoulder ABduction 3-/5    Left Shoulder Internal Rotation 3/5    Left Shoulder External Rotation 2/5                           OT Education -  09/05/20 1422    Education Details scapular A/ROM    Person(s) Educated Patient    Methods Explanation;Demonstration;Handout    Comprehension Verbalized understanding;Returned demonstration            OT Short Term Goals - 09/05/20 1429      OT SHORT TERM GOAL #1   Title Pt will be provided with and educated on HEP to improve mobility of LUE required for ADL completion.    Time 4    Period Weeks    Status New    Target Date 10/05/20      OT SHORT TERM GOAL #2   Title Pt will decrease pain in LUE to 2/10 or less to improve ability to use LUE as assist when walking dog.    Time 4    Period Weeks    Status New      OT SHORT TERM GOAL #3   Title Pt will decrease LUE fascial restrictions to minimal amounts or less to improve mobility required for functional reaching tasks.    Time 4    Period Weeks    Status New      OT SHORT TERM GOAL #4   Title Pt will increase LUE A/ROM to 50-75% to improve ability to perform dressing and bathing tasks with greater ease.    Time 4    Period Weeks    Status New      OT SHORT TERM GOAL #5   Title Pt will increase LUE strength to 4-/5 or greater to improve ability to perform light lifting tasks within available ROM.    Time 4    Period Weeks    Status New                    Plan - 09/05/20 1423    Clinical Impression Statement A: Pt is a 65 y/o male s/p left proximal humerus fx with axillary nerve injury on 01/15/20. Pt presents with deficits limiting ability to perform functional reaching and functional use of LUE during ADLs and leisure tasks. Discussed lack of axillary nerve support and rehab potential, pt verbalized understanding and is agreeable to work on stretching and strengthening the function  he does have.    OT Occupational Profile and History Problem Focused Assessment - Including review of records relating to presenting problem    Occupational performance deficits (Please refer to evaluation for details):  ADL's;IADL's;Leisure    Body Structure / Function / Physical Skills ADL;Endurance;UE functional use;Fascial restriction;Pain;ROM;IADL;Strength    Rehab Potential Good    Clinical Decision Making Limited treatment options, no task modification necessary    Comorbidities Affecting Occupational Performance: None    Modification or Assistance to Complete Evaluation  No modification of tasks or assist necessary to complete eval    OT Frequency 2x / week    OT Duration 4 weeks    OT Treatment/Interventions Self-care/ADL training;Ultrasound;Patient/family education;Passive range of motion;Cryotherapy;Electrical Stimulation;Moist Heat;Therapeutic exercise;Manual Therapy;Therapeutic activities    Plan P: Pt will benefit from skilled OT services to decrease pain and fascial restrictions, increase ROM and strength of LUE during functional tasks. Treatment plan: myofascial release, manual techniques, P/ROM, A/ROM, general LUE strengthening, scapular mobility/stability/strengthening, modalities prn    OT Home Exercise Plan eval: scapular A/ROM    Consulted and Agree with Plan of Care Patient           Patient will benefit from skilled therapeutic intervention in order to improve the following deficits and impairments:   Body Structure / Function / Physical Skills: ADL,Endurance,UE functional use,Fascial restriction,Pain,ROM,IADL,Strength       Visit Diagnosis: Chronic left shoulder pain  Stiffness of left shoulder, not elsewhere classified  Other symptoms and signs involving the musculoskeletal system    Problem List Patient Active Problem List   Diagnosis Date Noted  . Reduced vision 04/06/2019  . Right inguinal hernia 10/07/2018  . Renal calculus, right 10/07/2018  . Overweight (BMI 25.0-29.9) 01/21/2016  . Annual physical exam 10/12/2014  . Hypertension, essential 04/08/2014  . Alcoholic cardiomyopathy ([place]) 04/08/2014  . Hyperlipidemia   . Tobacco abuse   . Palpitations 01/05/2013   . Alcohol dependence ([place]) 02/21/2010  . MENTAL RETARDATION, MILD 01/12/2008   Guadelupe Sabin, OTR/L  [phone] 09/05/2020, 2:36 PM  [place], [place], [address] Phone: [phone]   Fax:  [phone]  Name: Alexander Simmons MRN: [id number] Date of Birth: 1956-07-22

## 2020-09-05 NOTE — Patient Instructions (Signed)
  1) Seated Row   Sit up straight with elbows by your sides. Pull back with shoulders/elbows, keeping forearms straight, as if pulling back on the reins of a horse. Squeeze shoulder blades together. Repeat _10-15__times, __2-3__sets/day    2) Shoulder Elevation    Sit up straight with arms by your sides. Slowly bring your shoulders up towards your ears. Repeat_10-15__times, __2-3__ sets/day    3) Shoulder Extension    Sit up straight with both arms by your side, draw your arms back behind your waist. Keep your elbows straight. Repeat __10-15__times, __2-3__sets/day.       

## 2020-09-12 ENCOUNTER — Encounter (HOSPITAL_COMMUNITY): Payer: Medicare Other

## 2020-09-13 ENCOUNTER — Other Ambulatory Visit: Payer: Self-pay

## 2020-09-13 ENCOUNTER — Encounter (HOSPITAL_COMMUNITY): Payer: Self-pay

## 2020-09-13 ENCOUNTER — Ambulatory Visit (HOSPITAL_COMMUNITY): Payer: Medicare Other

## 2020-09-13 DIAGNOSIS — R29898 Other symptoms and signs involving the musculoskeletal system: Secondary | ICD-10-CM | POA: Diagnosis not present

## 2020-09-13 DIAGNOSIS — M25512 Pain in left shoulder: Secondary | ICD-10-CM | POA: Diagnosis not present

## 2020-09-13 DIAGNOSIS — G8929 Other chronic pain: Secondary | ICD-10-CM | POA: Diagnosis not present

## 2020-09-13 DIAGNOSIS — M25612 Stiffness of left shoulder, not elsewhere classified: Secondary | ICD-10-CM

## 2020-09-13 NOTE — Therapy (Signed)
[place] [facility] [address] [place], [place], [address] Phone: [phone]   Fax:  [phone]  Occupational Therapy Treatment  Patient Details  Name: Alexander Simmons MRN: [id number] Date of Birth: 1956-08-15 Referring Provider (OT): Dr. Cammy Copa   Encounter Date: 09/13/2020   OT End of Session - 09/13/20 1600    Visit Number 2    Number of Visits 8    Date for OT Re-Evaluation 10/05/20    Authorization Type [employer] Medicare    Progress Note Due on Visit 10    OT Start Time 1527   Pt arrived late   OT Stop Time 1557    OT Time Calculation (min) 30 min    Activity Tolerance Patient tolerated treatment well    Behavior During Therapy [facility] for tasks assessed/performed           Past Medical History:  Diagnosis Date  . Alcohol abuse   . Allergy   . Cor pulmonale (HCC)    By echocardiography  . Glaucoma   . Hyperlipidemia   . Hypertension   . Small bowel obstruction (HCC)   . Syncope 12/2009   Attributed to use of narcotics plus alcohol  . Tobacco abuse     Past Surgical History:  Procedure Laterality Date  . ABDOMINAL SURGERY    . COLONOSCOPY N/A 01/28/2013   Procedure: COLONOSCOPY;  Surgeon: Malissa Hippo, MD;  [facility];  Service: Endoscopy;  Laterality: N/A;  1225  . PILONIDAL CYST EXCISION  2005   Dr. Elpidio Anis  . TOOTH EXTRACTION      There were no vitals filed for this visit.   Subjective Assessment - 09/13/20 1534    Subjective  S: I've been working on it.    Currently in Pain? Yes    Pain Score 3     Pain Location Shoulder    Pain Orientation Left    Pain Descriptors / Indicators Sore    Pain Type Chronic pain    Pain Onset In the past 7 days    Pain Frequency Occasional    Aggravating Factors  New exercises and new movement    Pain Relieving Factors Nothing needed.    Effect of Pain on Daily Activities No effect    Multiple Pain Sites No              [facility] OT Assessment -  09/13/20 1559      Assessment   Medical Diagnosis s/p left proximal humerus fx with axillary nerve injury      Precautions   Precautions None                    OT Treatments/Exercises (OP) - 09/13/20 1550      Exercises   Exercises Shoulder      Shoulder Exercises: Supine   Protraction AAROM;10 reps    Horizontal ABduction PROM;5 reps;AAROM;10 reps    External Rotation PROM;10 reps   slightly abducted   Internal Rotation PROM;10 reps   slightly abducted   Flexion PROM;AAROM;10 reps    ABduction PROM;AAROM;10 reps      Shoulder Exercises: Seated   Protraction AAROM;10 reps      Shoulder Exercises: Standing   Other Standing Exercises countertop wash with washcloth; 1'      Manual Therapy   Manual Therapy Myofascial release;Muscle Energy Technique;Scapular mobilization    Manual therapy comments Manual therapy completed prior to exercises.    Myofascial  Release Myofascial release and manual stretching completed to left upper arm, trapezius, and scapularis region to decrease fascial restrictions and increase joint mobility in a pain free zone.    Scapular Mobilization Scapular mobilizations completed supine during P/ROM of the left shoulder to increase mobility during functional reaching and use.    Muscle Energy Technique Muscle energy technique completed to left external rotators to relax tone and muscle spasm and improve range of motion.                  OT Education - 09/13/20 1554    Education Details reviewed therapy goals.    Person(s) Educated Patient    Methods Explanation    Comprehension Verbalized understanding            OT Short Term Goals - 09/13/20 1531      OT SHORT TERM GOAL #1   Title Pt will be provided with and educated on HEP to improve mobility of LUE required for ADL completion.    Time 4    Period Weeks    Status On-going    Target Date 10/05/20      OT SHORT TERM GOAL #2   Title Pt will decrease pain in LUE to 2/10 or  less to improve ability to use LUE as assist when walking dog.    Time 4    Period Weeks    Status On-going      OT SHORT TERM GOAL #3   Title Pt will decrease LUE fascial restrictions to minimal amounts or less to improve mobility required for functional reaching tasks.    Time 4    Period Weeks    Status On-going      OT SHORT TERM GOAL #4   Title Pt will increase LUE A/ROM to 50-75% to improve ability to perform dressing and bathing tasks with greater ease.    Time 4    Period Weeks    Status On-going      OT SHORT TERM GOAL #5   Title Pt will increase LUE strength to 4-/5 or greater to improve ability to perform light lifting tasks within available ROM.    Time 4    Period Weeks    Status On-going                    Plan - 09/13/20 1601    Clinical Impression Statement A: Focused on manual techniques and mobilzation of the scapula and soft tissue surrounding the shoulder to increase functional mobility. Completed supine AA/ROM focusing on slow and controlled movement while attempting full range of motion if possible. VC for form and technique were provided. Pt continues to be extremely limited with available active ROM although responded well to manual techniques. Decreased mobilization of scapula noted during exercises.    Body Structure / Function / Physical Skills ADL;Endurance;UE functional use;Fascial restriction;Pain;ROM;IADL;Strength    Plan P: Continue with myofascial release, muscle energy technique, and scapular mobilization during passive stretching to increase ROM. Add PVC pipe slide.    Consulted and Agree with Plan of Care Patient           Patient will benefit from skilled therapeutic intervention in order to improve the following deficits and impairments:   Body Structure / Function / Physical Skills: ADL,Endurance,UE functional use,Fascial restriction,Pain,ROM,IADL,Strength       Visit Diagnosis: Chronic left shoulder pain  Stiffness of left  shoulder, not elsewhere classified  Other symptoms and signs involving the musculoskeletal system  Problem List Patient Active Problem List   Diagnosis Date Noted  . Reduced vision 04/06/2019  . Right inguinal hernia 10/07/2018  . Renal calculus, right 10/07/2018  . Overweight (BMI 25.0-29.9) 01/21/2016  . Annual physical exam 10/12/2014  . Hypertension, essential 04/08/2014  . Alcoholic cardiomyopathy ([place]) 04/08/2014  . Hyperlipidemia   . Tobacco abuse   . Palpitations 01/05/2013  . Alcohol dependence ([place]) 02/21/2010  . MENTAL RETARDATION, MILD 01/12/2008   Ailene Ravel, OTR/L,CBIS  [phone]  09/13/2020, 6:23 PM  [facility] [address] [place], [place], [address] Phone: [phone]   Fax:  [phone]  Name: Alexander Simmons MRN: [id number] Date of Birth: 1955/11/16

## 2020-09-14 ENCOUNTER — Ambulatory Visit (HOSPITAL_COMMUNITY): Payer: Medicare Other

## 2020-09-14 ENCOUNTER — Encounter (HOSPITAL_COMMUNITY): Payer: Self-pay

## 2020-09-14 DIAGNOSIS — R29898 Other symptoms and signs involving the musculoskeletal system: Secondary | ICD-10-CM | POA: Diagnosis not present

## 2020-09-14 DIAGNOSIS — M25612 Stiffness of left shoulder, not elsewhere classified: Secondary | ICD-10-CM

## 2020-09-14 DIAGNOSIS — G8929 Other chronic pain: Secondary | ICD-10-CM | POA: Diagnosis not present

## 2020-09-14 DIAGNOSIS — M25512 Pain in left shoulder: Secondary | ICD-10-CM | POA: Diagnosis not present

## 2020-09-14 NOTE — Therapy (Signed)
[place], [place], [address] Phone: [phone]   Fax:  [phone]  Occupational Therapy Treatment  Patient Details  Name: Alexander Simmons MRN: [id number] Date of Birth: 06/19/1956 Referring Provider (OT): Dr. Meredith Pel   Encounter Date: 09/14/2020   OT End of Session - 09/14/20 1215    Visit Number 3    Number of Visits 8    Date for OT Re-Evaluation 10/05/20    Authorization Type UHC Medicare    Progress Note Due on Visit 10    OT Start Time 1115    OT Stop Time 1154    OT Time Calculation (min) 39 min    Activity Tolerance Patient tolerated treatment well    Behavior During Therapy [facility] for tasks assessed/performed           Past Medical History:  Diagnosis Date  . Alcohol abuse   . Allergy   . Cor pulmonale (HCC)    By echocardiography  . Glaucoma   . Hyperlipidemia   . Hypertension   . Small bowel obstruction ([place])   . Syncope 12/2009   Attributed to use of narcotics plus alcohol  . Tobacco abuse     Past Surgical History:  Procedure Laterality Date  . ABDOMINAL SURGERY    . COLONOSCOPY N/A 01/28/2013   Procedure: COLONOSCOPY;  Surgeon: Rogene Houston, MD;  [facility];  Service: Endoscopy;  Laterality: N/A;  1225  . PILONIDAL CYST EXCISION  2005   Dr. Irving Shows  . TOOTH EXTRACTION      There were no vitals filed for this visit.   Subjective Assessment - 09/14/20 1116    Subjective  S: It is sore from yesterday.    Currently in Pain? Yes    Pain Score 2     Pain Location Shoulder    Pain Orientation Left    Pain Descriptors / Indicators Sore    Pain Type Chronic pain    Pain Onset Yesterday    Pain Frequency Occasional    Aggravating Factors  Therapy session yesterday and completing new exercises.    Pain Relieving Factors Over the counter Tylenol    Effect of Pain on Daily Activities no effect              [facility] OT Assessment - 09/14/20 1117       Assessment   Medical Diagnosis s/p left proximal humerus fx with axillary nerve injury      Precautions   Precautions None                    OT Treatments/Exercises (OP) - 09/14/20 1117      Exercises   Exercises Shoulder      Shoulder Exercises: Supine   Protraction PROM;AAROM;10 reps    Horizontal ABduction PROM;5 reps;AAROM;10 reps    External Rotation PROM;AAROM;10 reps   slightly abducted   Internal Rotation PROM;AAROM;10 reps   slightly abducted   Flexion PROM;AAROM;10 reps    ABduction PROM;AAROM;10 reps      Shoulder Exercises: Standing   Protraction AAROM;10 reps    Horizontal ABduction AAROM;10 reps;Limitations    Horizontal ABduction Limitations Low level to decrease shoulder elevation    External Rotation AAROM;10 reps    Internal Rotation AAROM;10 reps    Flexion AAROM;10 reps    ABduction AAROM;10 reps;Limitations    ABduction Limitations low level scaption due to shoulder elevation    Other  Standing Exercises PVC pipe slide; 10X      Manual Therapy   Manual Therapy Myofascial release;Muscle Energy Technique;Scapular mobilization    Manual therapy comments Manual therapy completed prior to exercises.    Myofascial Release Myofascial release and manual stretching completed to left upper arm, trapezius, and scapularis region to decrease fascial restrictions and increase joint mobility in a pain free zone.    Scapular Mobilization Scapular mobilizations completed supine during P/ROM of the left shoulder to increase mobility during functional reaching and use.    Muscle Energy Technique Muscle energy technique completed to left external rotators and shoulder flexors to relax tone and muscle spasm and improve range of motion.                  OT Education - 09/14/20 1215    Education Details AA/ROM shoulder exercises. may complete supine or standing. Ok to continue with previous 3 A/ROM scapular exercises.    Person(s) Educated Patient     Methods Explanation;Demonstration;Verbal cues;Handout    Comprehension Returned demonstration;Verbalized understanding            OT Short Term Goals - 09/13/20 1531      OT SHORT TERM GOAL #1   Title Pt will be provided with and educated on HEP to improve mobility of LUE required for ADL completion.    Time 4    Period Weeks    Status On-going    Target Date 10/05/20      OT SHORT TERM GOAL #2   Title Pt will decrease pain in LUE to 2/10 or less to improve ability to use LUE as assist when walking dog.    Time 4    Period Weeks    Status On-going      OT SHORT TERM GOAL #3   Title Pt will decrease LUE fascial restrictions to minimal amounts or less to improve mobility required for functional reaching tasks.    Time 4    Period Weeks    Status On-going      OT SHORT TERM GOAL #4   Title Pt will increase LUE A/ROM to 50-75% to improve ability to perform dressing and bathing tasks with greater ease.    Time 4    Period Weeks    Status On-going      OT SHORT TERM GOAL #5   Title Pt will increase LUE strength to 4-/5 or greater to improve ability to perform light lifting tasks within available ROM.    Time 4    Period Weeks    Status On-going                    Plan - 09/14/20 1216    Clinical Impression Statement A: Patient reports mild soreness from yesterday. Continued with use of manual techniques and mobilization techniques to increase functional mobility. Patient continues to present with decreased scapulohumeral rhythm although noted slight increase in P/ROM during flexion and abduction. Provided AA/ROM exercises for HEP at home. Provided VC for form and technique. While completing PVC pipe slide, tactile and verbal cueing was provided to refrain from compensatory movements performed due to limited ROM such as lateral leaning and lumber extension.    Body Structure / Function / Physical Skills ADL;Endurance;UE functional use;Fascial  restriction;Pain;ROM;IADL;Strength    Plan P: Continue with myofascial release, muscle energy technique, and scapular mobilization during passive stretching to increase ROM. Follow up on HEP. UBE bike to encourage functional scapulohumeral rhythm prior to exercises.  Attempt low row progress to using a light resistance band, serratus anterior punch and dynamic hug also.    OT Home Exercise Plan eval: scapular A/ROM 1/20: AA/ROM shoulder exercises    Consulted and Agree with Plan of Care Patient           Patient will benefit from skilled therapeutic intervention in order to improve the following deficits and impairments:   Body Structure / Function / Physical Skills: ADL,Endurance,UE functional use,Fascial restriction,Pain,ROM,IADL,Strength       Visit Diagnosis: Stiffness of left shoulder, not elsewhere classified  Other symptoms and signs involving the musculoskeletal system  Chronic left shoulder pain    Problem List Patient Active Problem List   Diagnosis Date Noted  . Reduced vision 04/06/2019  . Right inguinal hernia 10/07/2018  . Renal calculus, right 10/07/2018  . Overweight (BMI 25.0-29.9) 01/21/2016  . Annual physical exam 10/12/2014  . Hypertension, essential 04/08/2014  . Alcoholic cardiomyopathy ([place]) 04/08/2014  . Hyperlipidemia   . Tobacco abuse   . Palpitations 01/05/2013  . Alcohol dependence ([place]) 02/21/2010  . MENTAL RETARDATION, MILD 01/12/2008   Ailene Ravel, OTR/L,CBIS  [phone]  09/14/2020, 12:29 PM  [facility] [address] [place], [place], [address] Phone: [phone]   Fax:  [phone]  Name: Alexander Simmons MRN: [id number] Date of Birth: [birth year]

## 2020-09-14 NOTE — Patient Instructions (Signed)
Perform each exercise ____10-12____ reps. 1-2x days.   1) Protraction   Start by holding a wand or cane at chest height.  Next, slowly push the wand outwards in front of your body so that your elbows become fully straightened. Then, return to the original position.     2) Shoulder FLEXION   In the standing position, hold a wand/cane with both arms, palms down on both sides. Raise up the wand/cane allowing your unaffected arm to perform most of the effort. Your affected arm should be partially relaxed.      3) Internal/External ROTATION   In the standing position, hold a wand/cane with both hands keeping your elbows bent. Move your arms and wand/cane to one side.  Your affected arm should be partially relaxed while your unaffected arm performs most of the effort.       4) Shoulder ABDUCTION   While holding a wand/cane palm face up on the injured side and palm face down on the uninjured side, slowly raise up your injured arm to the side.        5) Horizontal Abduction/Adduction      Straight arms holding cane at shoulder height, bring cane to right, center, left. Repeat starting to left.   Copyright  VHI. All rights reserved.

## 2020-09-19 ENCOUNTER — Other Ambulatory Visit: Payer: Self-pay

## 2020-09-19 ENCOUNTER — Encounter (HOSPITAL_COMMUNITY): Payer: Self-pay | Admitting: Occupational Therapy

## 2020-09-19 ENCOUNTER — Ambulatory Visit (HOSPITAL_COMMUNITY): Payer: Medicare Other | Admitting: Occupational Therapy

## 2020-09-19 DIAGNOSIS — M25512 Pain in left shoulder: Secondary | ICD-10-CM

## 2020-09-19 DIAGNOSIS — R29898 Other symptoms and signs involving the musculoskeletal system: Secondary | ICD-10-CM | POA: Diagnosis not present

## 2020-09-19 DIAGNOSIS — M25612 Stiffness of left shoulder, not elsewhere classified: Secondary | ICD-10-CM

## 2020-09-19 DIAGNOSIS — G8929 Other chronic pain: Secondary | ICD-10-CM

## 2020-09-19 NOTE — Therapy (Signed)
[place], [place], [address] Phone: [phone]   Fax:  [phone]  Occupational Therapy Treatment  Patient Details  Name: Alexander Simmons MRN: [id number] Date of Birth: 19-Oct-1955 Referring Provider (OT): Dr. Meredith Pel   Encounter Date: 09/19/2020   OT End of Session - 09/19/20 1153    Visit Number 4    Number of Visits 8    Date for OT Re-Evaluation 10/05/20    Authorization Type UHC Medicare    Progress Note Due on Visit 10    OT Start Time 1033    OT Stop Time 1113    OT Time Calculation (min) 40 min    Activity Tolerance Patient tolerated treatment well    Behavior During Therapy [facility] for tasks assessed/performed           Past Medical History:  Diagnosis Date  . Alcohol abuse   . Allergy   . Cor pulmonale (HCC)    By echocardiography  . Glaucoma   . Hyperlipidemia   . Hypertension   . Small bowel obstruction ([place])   . Syncope 12/2009   Attributed to use of narcotics plus alcohol  . Tobacco abuse     Past Surgical History:  Procedure Laterality Date  . ABDOMINAL SURGERY    . COLONOSCOPY N/A 01/28/2013   Procedure: COLONOSCOPY;  Surgeon: Rogene Houston, MD;  [facility];  Service: Endoscopy;  Laterality: N/A;  1225  . PILONIDAL CYST EXCISION  2005   Dr. Irving Shows  . TOOTH EXTRACTION      There were no vitals filed for this visit.   Subjective Assessment - 09/19/20 1035    Subjective  S: Everything's going ok    Currently in Pain? No/denies              [facility] OT Assessment - 09/19/20 1030      Assessment   Medical Diagnosis s/p left proximal humerus fx with axillary nerve injury      Precautions   Precautions None                    OT Treatments/Exercises (OP) - 09/19/20 1032      Exercises   Exercises Shoulder      Shoulder Exercises: Supine   External Rotation PROM;5 reps    Internal Rotation PROM;5 reps    Flexion PROM;5 reps     ABduction PROM;5 reps    Other Supine Exercises Attempted serratus anterior punch, max difficulty with form      Shoulder Exercises: Standing   Protraction AAROM;10 reps    Horizontal ABduction AAROM;10 reps;Limitations    Horizontal ABduction Limitations Low level to decrease shoulder elevation    External Rotation AAROM;10 reps    Internal Rotation AAROM;10 reps    Flexion AAROM;10 reps    ABduction AAROM;10 reps;Limitations    ABduction Limitations low level scaption due to shoulder elevation    Extension AROM;10 reps    Row AROM;10 reps    Other Standing Exercises PVC pipe slide; 10X      Shoulder Exercises: Pulleys   Flexion 1 minute      Shoulder Exercises: Therapy Ball   Other Therapy Ball Exercises Ball roll: rolling green therapy ball up the wall for flexion stretch, 10X      Shoulder Exercises: ROM/Strengthening   UBE (Upper Arm Bike) Level 1 3' forward 3' reverse, pace: 4.5    Other ROM/Strengthening Exercises dynamic  hug with no band or weight-protraction and coming back into retraction, 10X with max cuing      Manual Therapy   Manual Therapy Scapular mobilization    Scapular Mobilization Scapular mobilizations completed seated to increase mobility during functional reaching and use. Max cuing for form                    OT Short Term Goals - 09/13/20 1531      OT SHORT TERM GOAL #1   Title Pt will be provided with and educated on HEP to improve mobility of LUE required for ADL completion.    Time 4    Period Weeks    Status On-going    Target Date 10/05/20      OT SHORT TERM GOAL #2   Title Pt will decrease pain in LUE to 2/10 or less to improve ability to use LUE as assist when walking dog.    Time 4    Period Weeks    Status On-going      OT SHORT TERM GOAL #3   Title Pt will decrease LUE fascial restrictions to minimal amounts or less to improve mobility required for functional reaching tasks.    Time 4    Period Weeks    Status On-going       OT SHORT TERM GOAL #4   Title Pt will increase LUE A/ROM to 50-75% to improve ability to perform dressing and bathing tasks with greater ease.    Time 4    Period Weeks    Status On-going      OT SHORT TERM GOAL #5   Title Pt will increase LUE strength to 4-/5 or greater to improve ability to perform light lifting tasks within available ROM.    Time 4    Period Weeks    Status On-going                    Plan - 09/19/20 1130    Clinical Impression Statement A: Pt completing UBE first before exercises during session. Pt with limited scapular mobility, attempted to improve throughout session however pt with max difficulty following visual demo and verbal cuing for correct form. Consistent cuing throughout session for keeping elbows straight versus compensated with flexed elbows and activated trapezius. Passive stretching completed at end of session, no myofascial release completed today.    Body Structure / Function / Physical Skills ADL;Endurance;UE functional use;Fascial restriction;Pain;ROM;IADL;Strength    Plan P: Continue with UBE first, attempt light resistance band row and protraction    OT Home Exercise Plan eval: scapular A/ROM 1/20: AA/ROM shoulder exercises    Consulted and Agree with Plan of Care Patient           Patient will benefit from skilled therapeutic intervention in order to improve the following deficits and impairments:   Body Structure / Function / Physical Skills: ADL,Endurance,UE functional use,Fascial restriction,Pain,ROM,IADL,Strength       Visit Diagnosis: Stiffness of left shoulder, not elsewhere classified  Other symptoms and signs involving the musculoskeletal system  Chronic left shoulder pain    Problem List Patient Active Problem List   Diagnosis Date Noted  . Reduced vision 04/06/2019  . Right inguinal hernia 10/07/2018  . Renal calculus, right 10/07/2018  . Overweight (BMI 25.0-29.9) 01/21/2016  . Annual physical exam  10/12/2014  . Hypertension, essential 04/08/2014  . Alcoholic cardiomyopathy ([place]) 04/08/2014  . Hyperlipidemia   . Tobacco abuse   . Palpitations 01/05/2013  .  Alcohol dependence ([place]) 02/21/2010  . MENTAL RETARDATION, MILD 01/12/2008   Guadelupe Sabin, OTR/L  [phone] 09/19/2020, 11:54 AM  [facility] [address] [place], [place], [address] Phone: [phone]   Fax:  [phone]  Name: FREY KUE MRN: [id number] Date of Birth: 06-19-1956

## 2020-09-21 ENCOUNTER — Other Ambulatory Visit: Payer: Self-pay

## 2020-09-21 ENCOUNTER — Encounter (HOSPITAL_COMMUNITY): Payer: Self-pay

## 2020-09-21 ENCOUNTER — Ambulatory Visit (HOSPITAL_COMMUNITY): Payer: Medicare Other | Admitting: Occupational Therapy

## 2020-09-21 DIAGNOSIS — H5213 Myopia, bilateral: Secondary | ICD-10-CM | POA: Diagnosis not present

## 2020-09-25 ENCOUNTER — Ambulatory Visit (HOSPITAL_COMMUNITY): Payer: Medicare Other

## 2020-09-27 ENCOUNTER — Ambulatory Visit (HOSPITAL_COMMUNITY): Payer: Medicare Other | Attending: Orthopedic Surgery

## 2020-09-27 DIAGNOSIS — M25612 Stiffness of left shoulder, not elsewhere classified: Secondary | ICD-10-CM | POA: Insufficient documentation

## 2020-09-27 DIAGNOSIS — G8929 Other chronic pain: Secondary | ICD-10-CM | POA: Insufficient documentation

## 2020-09-27 DIAGNOSIS — M25512 Pain in left shoulder: Secondary | ICD-10-CM | POA: Insufficient documentation

## 2020-09-27 DIAGNOSIS — R29898 Other symptoms and signs involving the musculoskeletal system: Secondary | ICD-10-CM | POA: Insufficient documentation

## 2020-10-02 ENCOUNTER — Other Ambulatory Visit: Payer: Self-pay

## 2020-10-02 ENCOUNTER — Ambulatory Visit (HOSPITAL_COMMUNITY): Payer: Medicare Other

## 2020-10-02 ENCOUNTER — Encounter (HOSPITAL_COMMUNITY): Payer: Self-pay

## 2020-10-02 DIAGNOSIS — M25512 Pain in left shoulder: Secondary | ICD-10-CM

## 2020-10-02 DIAGNOSIS — R29898 Other symptoms and signs involving the musculoskeletal system: Secondary | ICD-10-CM | POA: Diagnosis not present

## 2020-10-02 DIAGNOSIS — M25612 Stiffness of left shoulder, not elsewhere classified: Secondary | ICD-10-CM | POA: Diagnosis not present

## 2020-10-02 DIAGNOSIS — G8929 Other chronic pain: Secondary | ICD-10-CM

## 2020-10-02 NOTE — Therapy (Signed)
[place], [place], [address] Phone: [phone]   Fax:  [phone]  Occupational Therapy Treatment  Patient Details  Name: Alexander Simmons MRN: [id number] Date of Birth: [birth year] Referring Provider (OT): Dr. Meredith Pel   Encounter Date: 10/02/2020   OT End of Session - 10/02/20 1731    Visit Number 5    Number of Visits 8    Date for OT Re-Evaluation 10/05/20    Authorization Type [employer] Medicare    Progress Note Due on Visit 10    OT Start Time 1645    OT Stop Time 1723    OT Time Calculation (min) 38 min    Activity Tolerance Patient tolerated treatment well    Behavior During Therapy [facility] for tasks assessed/performed           Past Medical History:  Diagnosis Date  . Alcohol abuse   . Allergy   . Cor pulmonale (HCC)    By echocardiography  . Glaucoma   . Hyperlipidemia   . Hypertension   . Small bowel obstruction ([place])   . Syncope 12/2009   Attributed to use of narcotics plus alcohol  . Tobacco abuse     Past Surgical History:  Procedure Laterality Date  . ABDOMINAL SURGERY    . COLONOSCOPY N/A 01/28/2013   Procedure: COLONOSCOPY;  Surgeon: Rogene Houston, MD;  Location: [facility];  Service: Endoscopy;  Laterality: N/A;  1225  . PILONIDAL CYST EXCISION  2005   Dr. Irving Shows  . TOOTH EXTRACTION      There were no vitals filed for this visit.   Subjective Assessment - 10/02/20 1653    Subjective  S: It's sore today in the front.    Currently in Pain? 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      Precautions   Precautions None                    OT Treatments/Exercises (OP) - 10/02/20 1653      Exercises   Exercises  Shoulder      Shoulder Exercises: Standing   Protraction Theraband;AROM;10 reps    Theraband Level (Shoulder Protraction) Level 2 (Red)    Protraction Limitations used mirror for A/ROM    External Rotation AROM;10 reps    External Rotation Limitations used mirror    Internal Rotation AROM;10 reps    Internal Rotation Limitations used mirror    Extension Theraband;10 reps    Theraband Level (Shoulder Extension) Level 2 (Red)    Row Theraband;12 reps   palms up   Theraband Level (Shoulder Row) Level 2 (Red)      Shoulder Exercises: Pulleys   Flexion 1 minute   standing     Shoulder Exercises: Therapy Ball   Flexion Both;10 reps   2 second hold   ABduction Both;10 reps   2 second hold   Right/Left 5 reps   each direction     Shoulder Exercises: ROM/Strengthening   UBE (Upper Arm Bike) Level 2 2' forward 2' reverse,   pace;  7.0-8.0                   OT Short Term Goals - 09/13/20 1531      OT SHORT TERM GOAL #1   Title Pt will be provided with and educated on HEP to improve mobility of LUE required for ADL completion.    Time 4    Period Weeks    Status On-going    Target Date 10/05/20      OT SHORT TERM GOAL #2   Title Pt will decrease pain in LUE to 2/10 or less to improve ability to use LUE as assist when walking dog.    Time 4    Period Weeks    Status On-going      OT SHORT TERM GOAL #3   Title Pt will decrease LUE fascial restrictions to minimal amounts or less to improve mobility required for functional reaching tasks.    Time 4    Period Weeks    Status On-going      OT SHORT TERM GOAL #4   Title Pt will increase LUE A/ROM to 50-75% to improve ability to perform dressing and bathing tasks with greater ease.    Time 4    Period Weeks    Status On-going      OT SHORT TERM GOAL #5   Title Pt will increase LUE strength to 4-/5 or greater to improve ability to perform light lifting tasks within available ROM.    Time 4    Period Weeks    Status  On-going                    Plan - 10/02/20 1732    Clinical Impression Statement A: Focused on scapular mobilization throughout session. Patient with limited scapular range. Able to complete shoulder flexion to approximately shoulder level with verbal and tactile cues for form and technique. Mirror used during session to providd visual feedback of form.    Body Structure / Function / Physical Skills ADL;Endurance;UE functional use;Fascial restriction;Pain;ROM;IADL;Strength    Plan P: Reassess. Determine if discharge is needed with HEP or to continue therapy.    Consulted and Agree with Plan of Care Patient           Patient will benefit from skilled therapeutic intervention in order to improve the following deficits and impairments:   Body Structure / Function / Physical Skills: ADL,Endurance,UE functional use,Fascial restriction,Pain,ROM,IADL,Strength       Visit Diagnosis: Stiffness of left shoulder, not elsewhere classified  Other symptoms and signs involving the musculoskeletal system  Chronic left shoulder pain    Problem List Patient Active Problem List   Diagnosis Date Noted  . Reduced vision 04/06/2019  . Right inguinal hernia 10/07/2018  . Renal calculus, right 10/07/2018  . Overweight (BMI 25.0-29.9) 01/21/2016  . Annual physical exam 10/12/2014  . Hypertension, essential 04/08/2014  . Alcoholic cardiomyopathy ([place]) 04/08/2014  . Hyperlipidemia   . Tobacco abuse   . Palpitations 01/05/2013  . Alcohol dependence ([place]) 02/21/2010  . MENTAL RETARDATION, MILD 01/12/2008    Ailene Ravel, OTR/L,CBIS  [phone]  10/02/2020, 5:35 PM  [place] [address] [place], [place], [address] Phone: [phone]   Fax:  [phone]  Name: Alexander Simmons MRN: [id number] Date of Birth: 1955-10-03

## 2020-10-03 ENCOUNTER — Ambulatory Visit (INDEPENDENT_AMBULATORY_CARE_PROVIDER_SITE_OTHER): Payer: Medicare Other

## 2020-10-03 VITALS — Ht 61.0 in | Wt 150.0 lb

## 2020-10-03 DIAGNOSIS — Z0000 Encounter for general adult medical examination without abnormal findings: Secondary | ICD-10-CM

## 2020-10-03 NOTE — Progress Notes (Signed)
Subjective:   Alexander Simmons is a 65 y.o. male who presents for Medicare Annual/Subsequent preventive examination.  Review of Systems      Objective:    There were no vitals filed for this visit. There is no height or weight on file to calculate BMI.  Advanced Directives 09/05/2020 01/15/2020 09/23/2018 08/26/2018 01/22/2017 01/28/2013  Does Patient Have a Medical Advance Directive? No No No No No Patient does not have advance directive;Patient would not like information  Would patient like information on creating a medical advance directive? No - Patient declined - No - Patient declined No - Patient declined Yes (MAU/Ambulatory/Procedural Areas - Information given) -  Pre-existing out of facility DNR order (yellow form or pink MOST form) - - - - - No    Current Medications (verified) Outpatient Encounter Medications as of 10/03/2020  Medication Sig  . ergocalciferol (VITAMIN D2) 1.25 MG (50000 UT) capsule Take 1 capsule (50,000 Units total) by mouth once a week. One capsule once weekly  . metoprolol tartrate (LOPRESSOR) 25 MG tablet Take 1 tablet by mouth twice daily   No facility-administered encounter medications on file as of 10/03/2020.    Allergies (verified) Patient has no known allergies.   History: Past Medical History:  Diagnosis Date  . Alcohol abuse   . Allergy   . Cor pulmonale (HCC)    By echocardiography  . Glaucoma   . Hyperlipidemia   . Hypertension   . Small bowel obstruction ([place])   . Syncope 12/2009   Attributed to use of narcotics plus alcohol  . Tobacco abuse    Past Surgical History:  Procedure Laterality Date  . ABDOMINAL SURGERY    . COLONOSCOPY N/A 01/28/2013   Procedure: COLONOSCOPY;  Surgeon: Rogene Houston, MD;  [facility];  Service: Endoscopy;  Laterality: N/A;  1225  . PILONIDAL CYST EXCISION  2005   Dr. Irving Shows  . TOOTH EXTRACTION     Family History  Problem Relation Age of Onset  . Other Other        FH unknown-patient  raised in foster care system  . Colon cancer Neg Hx    Social History   Socioeconomic History  . Marital status: Single    Spouse name: Not on file  . Number of children: 1  . Years of education: 82  . Highest education level: 12th grade  Occupational History  . Occupation: disabled   Tobacco Use  . Smoking status: Never Smoker  . Smokeless tobacco: Current User    Types: Snuff, Chew  . Tobacco comment: chews tobacco since age 35.  Vaping Use  . Vaping Use: Former  Substance and Sexual Activity  . Alcohol use: Yes    Alcohol/week: 20.0 standard drinks    Types: 20 Shots of liquor per week    Comment: 1 pint of gin daily (4cups daily)  . Drug use: No  . Sexual activity: Not Currently  Other Topics Concern  . Not on file  Social History Narrative   Lives with his niece    Social Determinants of Health   Financial Resource Strain: Not on file  Food Insecurity: Not on file  Transportation Needs: Not on file  Physical Activity: Not on file  Stress: Not on file  Social Connections: Not on file    Tobacco Counseling Ready to quit: Not Answered Counseling given: Not Answered Comment: chews tobacco since age 41.   Clinical Intake:  Diabetic? yes         Activities of Daily Living No flowsheet data found.  Patient Care Team: Fayrene Helper, MD as PCP - General (Family Medicine) Herminio Commons, MD (Inactive) as PCP - Cardiology (Cardiology) Lattie Haw Cristopher Estimable, MD (Inactive) as Attending Physician (Cardiology)  Indicate any recent Medical Services you may have received from other than [facility] providers in the past year (date may be approximate).     Assessment:   This is a routine wellness examination for [facility].  Hearing/Vision screen No exam data present  Dietary issues and exercise activities discussed:    Goals    . Quit using tobacco      Depression Screen PHQ 2/9 Scores 07/04/2020 05/11/2020 03/30/2020 10/01/2019  04/06/2019 03/31/2019 10/07/2018  PHQ - 2 Score 2 0 3 0 0 0 0  PHQ- 9 Score - - 13 - - - -    Fall Risk Fall Risk  07/04/2020 05/11/2020 03/30/2020 10/01/2019 04/06/2019  Falls in the past year? 0 1 0 0 0  Number falls in past yr: - 0 0 0 0  Injury with Fall? - 0 0 0 0    FALL RISK PREVENTION PERTAINING TO THE HOME:  Any stairs in or around the home? Yes  If so, are there any without handrails? No  Home free of loose throw rugs in walkways, pet beds, electrical cords, etc? Yes  Adequate lighting in your home to reduce risk of falls? Yes   ASSISTIVE DEVICES UTILIZED TO PREVENT FALLS:  Life alert? No  Use of a cane, walker or w/c? No  Grab bars in the bathroom? Yes  Shower chair or bench in shower? No  Elevated toilet seat or a handicapped toilet? No   TIMED UP AND GO:  Was the test performed? No .  Length of time to ambulate n/a     Cognitive Function:     6CIT Screen 10/01/2019 09/23/2018 01/22/2017  What Year? 0 points 0 points 0 points  What month? 0 points 0 points 0 points  What time? 0 points 0 points 0 points  Count back from 20 0 points 0 points 0 points  Months in reverse 4 points 4 points 0 points  Repeat phrase 2 points 0 points 0 points  Total Score 6 4 0    Immunizations Immunization History  Administered Date(s) Administered  . Fluad Quad(high Dose 65+) 05/11/2020  . Influenza Whole 08/19/2007, 06/05/2010  . Influenza,inj,Quad PF,6+ Mos 06/01/2014, 06/27/2016, 09/23/2018, 05/10/2019  . Pneumococcal Conjugate-13 04/06/2014, 05/11/2020  . Td 02/08/2004  . Tdap 06/27/2016    TDAP status: Up to date  Flu: Up to date  Pneumococcal vaccine status: Up to date  Covid-19 vaccine status: Completed vaccines  Qualifies for Shingles Vaccine? Yes   Zostavax completed No   Shingrix Completed?: No.    Education has been provided regarding the importance of this vaccine. Patient has been advised to call insurance company to determine out of pocket expense if they have  not yet received this vaccine. Advised may also receive vaccine at local pharmacy or Health Dept. Verbalized acceptance and understanding.  Screening Tests Health Maintenance  Topic Date Due  . COVID-19 Vaccine (1) Never done  . PNA vac Low Risk Adult (2 of 2 - PPSV23) 05/11/2021  . COLONOSCOPY (Pts 45-35yrs Insurance coverage will need to be confirmed)  01/29/2023  . TETANUS/TDAP  06/27/2026  . INFLUENZA VACCINE  Completed  . Hepatitis C Screening  Completed  Health Maintenance  Health Maintenance Due  Topic Date Due  . COVID-19 Vaccine (1) Never done    Colorectal cancer screening: Type of screening: Colonoscopy. Completed 01/28/13. Repeat every 10 years  Lung Cancer Screening: (Low Dose CT Chest recommended if Age 54-80 years, 30 pack-year currently smoking OR have quit w/in 15years.) does not qualify.   Lung Cancer Screening Referral: n/a  Additional Screening:  Hepatitis C Screening: does not qualify; Completed  Vision Screening: Recommended annual ophthalmology exams for early detection of glaucoma and other disorders of the eye. Is the patient up to date with their annual eye exam?  Yes  Who is the provider or what is the name of the office in which the patient attends annual eye exams? [facility] in [place] If pt is not established with a provider, would they like to be referred to a provider to establish care? No .   Dental Screening: Recommended annual dental exams for proper oral hygiene  Community Resource Referral / Chronic Care Management: CRR required this visit?  No   CCM required this visit?  No      Plan:     I have personally reviewed and noted the following in the patient's chart:   . Medical and social history . Use of alcohol, tobacco or illicit drugs  . Current medications and supplements . Functional ability and status . Nutritional status . Physical activity . Advanced directives . List of other physicians . Hospitalizations,  surgeries, and ER visits in previous 12 months . Vitals . Screenings to include cognitive, depression, and falls . Referrals and appointments  In addition, I have reviewed and discussed with patient certain preventive protocols, quality metrics, and best practice recommendations. A written personalized care plan for preventive services as well as general preventive health recommendations were provided to patient.     Laretta Bolster, [place]   8/0/3212   Nurse Notes: AWV conducted by nurse in office by phone. Patient gave consent to telehealth visit via audio. Patient at home at the time of this visit. Provider in the office at the time of this visit. Visit took 30 minutes to complete.

## 2020-10-03 NOTE — Patient Instructions (Addendum)
Alexander Simmons , Thank you for taking time to come for your Medicare Wellness Visit. I appreciate your ongoing commitment to your health goals. Please review the following plan we discussed and let me know if I can assist you in the future.   Screening recommendations/referrals: Colonoscopy: 01/29/23 Recommended yearly ophthalmology/optometry visit for glaucoma screening and checkup Recommended yearly dental visit for hygiene and checkup  Vaccinations: Influenza vaccine: Fall 2022 Pneumococcal vaccine: 05/11/21 Tdap vaccine: 06/27/26 Shingles vaccine: Declined  Advanced directives: No  Conditions/risks identified: None  Next appointment: 11/09/20 @ 1 pm  Preventive Care 67 Years and Older, Male Preventive care refers to lifestyle choices and visits with your health care provider that can promote health and wellness. What does preventive care include?  A yearly physical exam. This is also called an annual well check.  Dental exams once or twice a year.  Routine eye exams. Ask your health care provider how often you should have your eyes checked.  Personal lifestyle choices, including:  Daily care of your teeth and gums.  Regular physical activity.  Eating a healthy diet.  Avoiding tobacco and drug use.  Limiting alcohol use.  Practicing safe sex.  Taking low doses of aspirin every day.  Taking vitamin and mineral supplements as recommended by your health care provider. What happens during an annual well check? The services and screenings done by your health care provider during your annual well check will depend on your age, overall health, lifestyle risk factors, and family history of disease. Counseling  Your health care provider may ask you questions about your:  Alcohol use.  Tobacco use.  Drug use.  Emotional well-being.  Home and relationship well-being.  Sexual activity.  Eating habits.  History of falls.  Memory and ability to understand  (cognition).  Work and work Statistician. Screening  You may have the following tests or measurements:  Height, weight, and BMI.  Blood pressure.  Lipid and cholesterol levels. These may be checked every 5 years, or more frequently if you are over 87 years old.  Skin check.  Lung cancer screening. You may have this screening every year starting at age 75 if you have a 30-pack-year history of smoking and currently smoke or have quit within the past 15 years.  Fecal occult blood test (FOBT) of the stool. You may have this test every year starting at age 8.  Flexible sigmoidoscopy or colonoscopy. You may have a sigmoidoscopy every 5 years or a colonoscopy every 10 years starting at age 35.  Prostate cancer screening. Recommendations will vary depending on your family history and other risks.  Hepatitis C blood test.  Hepatitis B blood test.  Sexually transmitted disease (STD) testing.  Diabetes screening. This is done by checking your blood sugar (glucose) after you have not eaten for a while (fasting). You may have this done every 1-3 years.  Abdominal aortic aneurysm (AAA) screening. You may need this if you are a current or former smoker.  Osteoporosis. You may be screened starting at age 79 if you are at high risk. Talk with your health care provider about your test results, treatment options, and if necessary, the need for more tests. Vaccines  Your health care provider may recommend certain vaccines, such as:  Influenza vaccine. This is recommended every year.  Tetanus, diphtheria, and acellular pertussis (Tdap, Td) vaccine. You may need a Td booster every 10 years.  Zoster vaccine. You may need this after age 24.  Pneumococcal 13-valent conjugate (PCV13) vaccine.  One dose is recommended after age 40.  Pneumococcal polysaccharide (PPSV23) vaccine. One dose is recommended after age 31. Talk to your health care provider about which screenings and vaccines you need and  how often you need them. This information is not intended to replace advice given to you by your health care provider. Make sure you discuss any questions you have with your health care provider. Document Released: 09/08/2015 Document Revised: 05/01/2016 Document Reviewed: 06/13/2015 Elsevier Interactive Patient Education  2017 [place] Prevention in the Home Falls can cause injuries. They can happen to people of all ages. There are many things you can do to make your home safe and to help prevent falls. What can I do on the outside of my home?  Regularly fix the edges of walkways and driveways and fix any cracks.  Remove anything that might make you trip as you walk through a door, such as a raised step or threshold.  Trim any bushes or trees on the path to your home.  Use bright outdoor lighting.  Clear any walking paths of anything that might make someone trip, such as rocks or tools.  Regularly check to see if handrails are loose or broken. Make sure that both sides of any steps have handrails.  Any raised decks and porches should have guardrails on the edges.  Have any leaves, snow, or ice cleared regularly.  Use sand or salt on walking paths during winter.  Clean up any spills in your garage right away. This includes oil or grease spills. What can I do in the bathroom?  Use night lights.  Install grab bars by the toilet and in the tub and shower. Do not use towel bars as grab bars.  Use non-skid mats or decals in the tub or shower.  If you need to sit down in the shower, use a plastic, non-slip stool.  Keep the floor dry. Clean up any water that spills on the floor as soon as it happens.  Remove soap buildup in the tub or shower regularly.  Attach bath mats securely with double-sided non-slip rug tape.  Do not have throw rugs and other things on the floor that can make you trip. What can I do in the bedroom?  Use night lights.  Make sure that you  have a light by your bed that is easy to reach.  Do not use any sheets or blankets that are too big for your bed. They should not hang down onto the floor.  Have a firm chair that has side arms. You can use this for support while you get dressed.  Do not have throw rugs and other things on the floor that can make you trip. What can I do in the kitchen?  Clean up any spills right away.  Avoid walking on wet floors.  Keep items that you use a lot in easy-to-reach places.  If you need to reach something above you, use a strong step stool that has a grab bar.  Keep electrical cords out of the way.  Do not use floor polish or wax that makes floors slippery. If you must use wax, use non-skid floor wax.  Do not have throw rugs and other things on the floor that can make you trip. What can I do with my stairs?  Do not leave any items on the stairs.  Make sure that there are handrails on both sides of the stairs and use them. Fix handrails that are broken  or loose. Make sure that handrails are as long as the stairways.  Check any carpeting to make sure that it is firmly attached to the stairs. Fix any carpet that is loose or worn.  Avoid having throw rugs at the top or bottom of the stairs. If you do have throw rugs, attach them to the floor with carpet tape.  Make sure that you have a light switch at the top of the stairs and the bottom of the stairs. If you do not have them, ask someone to add them for you. What else can I do to help prevent falls?  Wear shoes that:  Do not have high heels.  Have rubber bottoms.  Are comfortable and fit you well.  Are closed at the toe. Do not wear sandals.  If you use a stepladder:  Make sure that it is fully opened. Do not climb a closed stepladder.  Make sure that both sides of the stepladder are locked into place.  Ask someone to hold it for you, if possible.  Clearly mark and make sure that you can see:  Any grab bars or  handrails.  First and last steps.  Where the edge of each step is.  Use tools that help you move around (mobility aids) if they are needed. These include:  Canes.  Walkers.  Scooters.  Crutches.  Turn on the lights when you go into a dark area. Replace any light bulbs as soon as they burn out.  Set up your furniture so you have a clear path. Avoid moving your furniture around.  If any of your floors are uneven, fix them.  If there are any pets around you, be aware of where they are.  Review your medicines with your doctor. Some medicines can make you feel dizzy. This can increase your chance of falling. Ask your doctor what other things that you can do to help prevent falls. This information is not intended to replace advice given to you by your health care provider. Make sure you discuss any questions you have with your health care provider. Document Released: 06/08/2009 Document Revised: 01/18/2016 Document Reviewed: 09/16/2014 Elsevier Interactive Patient Education  2017 [employer].

## 2020-10-06 ENCOUNTER — Ambulatory Visit (HOSPITAL_COMMUNITY): Payer: Medicare Other | Admitting: Occupational Therapy

## 2020-10-10 ENCOUNTER — Ambulatory Visit (HOSPITAL_COMMUNITY): Payer: Medicare Other | Admitting: Specialist

## 2020-10-10 ENCOUNTER — Encounter (HOSPITAL_COMMUNITY): Payer: Self-pay | Admitting: Specialist

## 2020-10-10 ENCOUNTER — Other Ambulatory Visit: Payer: Self-pay

## 2020-10-10 DIAGNOSIS — M25512 Pain in left shoulder: Secondary | ICD-10-CM | POA: Diagnosis not present

## 2020-10-10 DIAGNOSIS — G8929 Other chronic pain: Secondary | ICD-10-CM

## 2020-10-10 DIAGNOSIS — M25612 Stiffness of left shoulder, not elsewhere classified: Secondary | ICD-10-CM | POA: Diagnosis not present

## 2020-10-10 DIAGNOSIS — R29898 Other symptoms and signs involving the musculoskeletal system: Secondary | ICD-10-CM

## 2020-10-10 NOTE — Therapy (Signed)
[place], [place], [address] Phone: [phone]   Fax:  [phone]  Progress Note Reporting Period 09/05/20 to 10/10/20  See note below for Objective Data and Assessment of Progress/Goals.      Occupational Therapy Treatment  Patient Details  Name: Alexander Simmons MRN: [id number] Date of Birth: [birth year] Referring Provider (OT): Dr. Meredith Pel   Encounter Date: 10/10/2020   OT End of Session - 10/10/20 1619    Visit Number 6    Number of Visits 16    Date for OT Re-Evaluation 11/07/20    Authorization Type [employer] Medicare    Progress Note Due on Visit 16    OT Start Time 1523    OT Stop Time 1605    OT Time Calculation (min) 42 min    Activity Tolerance Patient tolerated treatment well    Behavior During Therapy [facility] for tasks assessed/performed           Past Medical History:  Diagnosis Date  . Alcohol abuse   . Allergy   . Cor pulmonale (HCC)    By echocardiography  . Glaucoma   . Hyperlipidemia   . Hypertension   . Small bowel obstruction ([place])   . Syncope 12/2009   Attributed to use of narcotics plus alcohol  . Tobacco abuse     Past Surgical History:  Procedure Laterality Date  . ABDOMINAL SURGERY    . COLONOSCOPY N/A 01/28/2013   Procedure: COLONOSCOPY;  Surgeon: Rogene Houston, MD;  [facility];  Service: Endoscopy;  Laterality: N/A;  1225  . PILONIDAL CYST EXCISION  2005   Dr. Irving Shows  . TOOTH EXTRACTION      There were no vitals filed for this visit.   Subjective Assessment - 10/10/20 1618    Subjective  S:  I think therapy is helping.  It is still sore and I have trouble reaching above my shoulder    Currently in Pain? 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 Meredith Pel      Precautions   Precautions None      ADL   ADL comments continues to have difficulty with reaching above shoulder height with dominant left arm      Observation/Other Assessments   Focus on Therapeutic Outcomes (FOTO)  65/100      AROM   Left Shoulder Flexion 81 Degrees   77   Left Shoulder ABduction 44 Degrees   44   Left Shoulder Internal Rotation 90 Degrees   90   Left Shoulder External Rotation 22 Degrees   14     PROM   Left Shoulder Flexion 130 Degrees   108   Left Shoulder ABduction 100 Degrees   91   Left Shoulder Internal Rotation 90 Degrees   90   Left Shoulder External Rotation 40 Degrees   35     Strength   Left Shoulder Flexion 3/5   3-/5   Left Shoulder ABduction 3/5   3-/5   Left Shoulder  Internal Rotation 3+/5   3/5   Left Shoulder External Rotation 2+/5   2/5                   OT Treatments/Exercises (OP) - 10/10/20 0001      Exercises   Exercises Shoulder      Shoulder Exercises: Supine   Protraction PROM;5 reps    Horizontal ABduction PROM;5 reps    External Rotation PROM;5 reps    Internal Rotation PROM;5 reps    Flexion PROM;5 reps    ABduction PROM;5 reps      Shoulder Exercises: Seated   Protraction AAROM;10 reps    Horizontal ABduction AAROM;10 reps    External Rotation AAROM;10 reps    Internal Rotation AAROM;10 reps    Flexion AAROM;10 reps    Abduction AAROM;10 reps      Shoulder Exercises: Standing   Extension Theraband;15 reps    Theraband Level (Shoulder Extension) Level 2 (Red)    Row Theraband;15 reps    Theraband Level (Shoulder Row) Level 2 (Red)    Retraction Theraband;15 reps    Theraband Level (Shoulder Retraction) Level 2 (Red)    Retraction Limitations max tactile facilitation for technique and positioning    Other Standing Exercises resume next session      Shoulder Exercises: ROM/Strengthening   UBE  (Upper Arm Bike) level 1 3' forward and 3' reverse, next session increase to level 2 3' forward and 3' reverse      Manual Therapy   Manual Therapy Myofascial release    Manual therapy comments Manual therapy completed prior to exercises.    Myofascial Release Myofascial release and manual stretching completed to left upper arm, trapezius, and scapularis region to decrease fascial restrictions and increase joint mobility in a pain free zone.                  OT Education - 10/10/20 1619    Education Details recommended continuing current HEP, continuing OT visits 2 times per week for an additional 4 weeks    Person(s) Educated Patient    Methods Explanation;Demonstration;Verbal cues    Comprehension Verbalized understanding            OT Short Term Goals - 10/10/20 1540      OT SHORT TERM GOAL #1   Title Pt will be provided with and educated on HEP to improve mobility of LUE required for ADL completion.    Time 4    Period Weeks    Status On-going    Target Date 10/05/20      OT SHORT TERM GOAL #2   Title Pt will decrease pain in LUE to 2/10 or less to improve ability to use LUE as assist when walking dog.    Time 4    Period Weeks    Status Achieved      OT SHORT TERM GOAL #3   Title Pt will decrease LUE fascial restrictions to minimal amounts or less to improve mobility required for functional reaching tasks.    Time 4    Period Weeks    Status Achieved      OT SHORT TERM GOAL #4   Title Pt will increase LUE A/ROM to 50-75% to improve ability to perform dressing and bathing tasks with greater ease.    Time 4    Period Weeks    Status On-going      OT SHORT TERM GOAL #5   Title Pt will increase LUE strength  to 4-/5 or greater to improve ability to perform light lifting tasks within available ROM.    Time 4    Period Weeks    Status On-going                    Plan - 10/10/20 1620    Clinical Impression Statement A:  Patient has made  improvements in P/ROM and minimal improvements in A/ROM.  He feels therapy is helping him improve functionally, however, he is not where he would like to be.  Recommend continuing skilled OT intervention for an additional 4 weeks for improved functional use of left arm.  Educated patient on transportation service for therapy to alleviate attendance barrier from progress.    Occupational performance deficits (Please refer to evaluation for details): ADL's;IADL's;Leisure    Body Structure / Function / Physical Skills ADL;Endurance;UE functional use;Fascial restriction;Pain;ROM;IADL;Strength    Rehab Potential Good    OT Frequency 2x / week    OT Duration 4 weeks    OT Treatment/Interventions Self-care/ADL training;Ultrasound;Patient/family education;Passive range of motion;Cryotherapy;Electrical Stimulation;Moist Heat;Therapeutic exercise;Manual Therapy;Therapeutic activities    Plan P: focus on improved scapular stability for improved functional use of LUE with daily tasks.  increase UBE to 2.0.  attempt sidelying a/rom exercises.    Consulted and Agree with Plan of Care Patient           Patient will benefit from skilled therapeutic intervention in order to improve the following deficits and impairments:   Body Structure / Function / Physical Skills: ADL,Endurance,UE functional use,Fascial restriction,Pain,ROM,IADL,Strength       Visit Diagnosis: Stiffness of left shoulder, not elsewhere classified - Plan: Ot plan of care cert/re-cert  Other symptoms and signs involving the musculoskeletal system - Plan: Ot plan of care cert/re-cert  Chronic left shoulder pain - Plan: Ot plan of care cert/re-cert    Problem List Patient Active Problem List   Diagnosis Date Noted  . Reduced vision 04/06/2019  . Right inguinal hernia 10/07/2018  . Renal calculus, right 10/07/2018  . Overweight (BMI 25.0-29.9) 01/21/2016  . Annual physical exam 10/12/2014  . Hypertension, essential 04/08/2014  .  Alcoholic cardiomyopathy ([place]) 04/08/2014  . Hyperlipidemia   . Tobacco abuse   . Palpitations 01/05/2013  . Alcohol dependence ([place]) 02/21/2010  . MENTAL RETARDATION, MILD 01/12/2008    Vangie Bicker, [place], OTR/L [phone]  10/10/2020, 4:30 PM  [place], [place], [address] Phone: [phone]   Fax:  [phone]  Name: Alexander Simmons MRN: [id number] Date of Birth: [birth year]

## 2020-10-16 ENCOUNTER — Ambulatory Visit (HOSPITAL_COMMUNITY): Payer: Medicare Other

## 2020-10-16 ENCOUNTER — Encounter (HOSPITAL_COMMUNITY): Payer: Self-pay

## 2020-10-16 ENCOUNTER — Other Ambulatory Visit: Payer: Self-pay

## 2020-10-16 DIAGNOSIS — M25612 Stiffness of left shoulder, not elsewhere classified: Secondary | ICD-10-CM | POA: Diagnosis not present

## 2020-10-16 DIAGNOSIS — R29898 Other symptoms and signs involving the musculoskeletal system: Secondary | ICD-10-CM

## 2020-10-16 DIAGNOSIS — G8929 Other chronic pain: Secondary | ICD-10-CM | POA: Diagnosis not present

## 2020-10-16 DIAGNOSIS — M25512 Pain in left shoulder: Secondary | ICD-10-CM | POA: Diagnosis not present

## 2020-10-16 NOTE — Patient Instructions (Signed)
Scapular Clocks  3 separate shoulder blade movements: x10 movements for each! Hold for 1-2 seconds for each one.   1st - lift top of shoulder up towards ear (#12 up towards ear) 2nd - lower shoulder blade down towards ground (tickle feet) (#9 down towards ground) 3rd - bring your Left shoulder blade towards the other one to squeeze the muscles in between shoulder blade and spine (#9 moves towards #3 or spine)

## 2020-10-17 NOTE — Therapy (Signed)
[place], [place], [address] Phone: [phone]   Fax:  [phone]  Occupational Therapy Treatment  Patient Details  Name: Alexander Simmons MRN: [id number] Date of Birth: Feb 16, 1956 Referring Provider (OT): Dr. Meredith Pel   Encounter Date: 10/16/2020   OT End of Session - 10/17/20 0948    Visit Number 7    Number of Visits 16    Date for OT Re-Evaluation 11/07/20    Authorization Type UHC Medicare    Progress Note Due on Visit 16    OT Start Time 1647    OT Stop Time 1730    OT Time Calculation (min) 43 min    Activity Tolerance Patient tolerated treatment well    Behavior During Therapy [facility] for tasks assessed/performed           Past Medical History:  Diagnosis Date  . Alcohol abuse   . Allergy   . Cor pulmonale (HCC)    By echocardiography  . Glaucoma   . Hyperlipidemia   . Hypertension   . Small bowel obstruction ([place])   . Syncope 12/2009   Attributed to use of narcotics plus alcohol  . Tobacco abuse     Past Surgical History:  Procedure Laterality Date  . ABDOMINAL SURGERY    . COLONOSCOPY N/A 01/28/2013   Procedure: COLONOSCOPY;  Surgeon: Rogene Houston, MD;  [facility];  Service: Endoscopy;  Laterality: N/A;  1225  . PILONIDAL CYST EXCISION  2005   Dr. Irving Shows  . TOOTH EXTRACTION      There were no vitals filed for this visit.   Subjective Assessment - 10/16/20 1651    Subjective  S: It's sore today.    Currently in Pain? Yes    Pain Score 1     Pain Location Shoulder    Pain Orientation Left    Pain Descriptors / Indicators Sore    Pain Type Chronic pain    Pain Onset Yesterday   last night   Pain Frequency Occasional    Aggravating Factors  exercises from therapy/HEP    Pain Relieving Factors over the counter Tylenol    Effect of Pain on Daily Activities no effect              [facility] OT Assessment - 10/16/20 1652      Assessment   Medical  Diagnosis s/p left proximal humerus fx with axillary nerve injury      Precautions   Precautions None                    OT Treatments/Exercises (OP) - 10/16/20 1652      Exercises   Exercises Shoulder      Shoulder Exercises: Seated   Row Theraband;12 reps   high to low row   Theraband Level (Shoulder Row) Level 2 (Red)      Shoulder Exercises: Prone   Flexion AROM;10 reps;Limitations    Flexion Limitations very limited ROM achieved actively    Horizontal ABduction 1 AROM;10 reps;Limitations    Horizontal ABduction 1 Limitations Very limited A/ROM. Therapist able to complete movement passively.    Other Prone Exercises A/ROM, row, 10X      Shoulder Exercises: Sidelying   External Rotation AROM;10 reps   towel roll   Internal Rotation AROM;10 reps   towel roll   Flexion AROM;10 reps    Other Sidelying Exercises protraction, A/ROM 10X  Shoulder Exercises: ROM/Strengthening   UBE (Upper Arm Bike) Level 2 2' reverse 2' forward   pace: 7.0-8.0     Shoulder Exercises: Stretch   Other Shoulder Stretches thoracic extension and latissimus stretch seated using back of chair with hands clasped. 2x30"    Other Shoulder Stretches Doorway stretch; 2x30"      Functional Reaching Activities   Mid Level Squigz utilized on door during mid level reaching using LUE with verbal and tactile cueing for form and technique.                    OT Short Term Goals - 10/17/20 1002      OT SHORT TERM GOAL #1   Title Pt will be provided with and educated on HEP to improve mobility of LUE required for ADL completion.    Time 4    Period Weeks    Status On-going    Target Date 10/05/20      OT SHORT TERM GOAL #2   Title Pt will decrease pain in LUE to 2/10 or less to improve ability to use LUE as assist when walking dog.    Time 4    Period Weeks      OT SHORT TERM GOAL #3   Title Pt will decrease LUE fascial restrictions to minimal amounts or less to improve mobility  required for functional reaching tasks.    Time 4    Period Weeks      OT SHORT TERM GOAL #4   Title Pt will increase LUE A/ROM to 50-75% to improve ability to perform dressing and bathing tasks with greater ease.    Time 4    Period Weeks    Status On-going      OT SHORT TERM GOAL #5   Title Pt will increase LUE strength to 4-/5 or greater to improve ability to perform light lifting tasks within available ROM.    Time 4    Period Weeks    Status On-going                    Plan - 10/17/20 9528    Clinical Impression Statement A: Minimal to no scapular mobility continues to present. Focus of session on scapular mobility and increasing scapulohumeral rhythm. Pt requires max verbal and tactile cues due to poor body awareness in order to achieve proper form and technique. Added shoulder stretches at door to increase mobility and functional reaching.    Body Structure / Function / Physical Skills ADL;Endurance;UE functional use;Fascial restriction;Pain;ROM;IADL;Strength    Plan P: Focus on scapulohumeral rhythm. Do not stretch arm supine to focus on muscle stabilizing shoulder versus ground. Update HEP to focus on muscle coordination with scapular mobility. Complete in clinic and include in HEP: push up plus (use wall or counter), low row (red band), horizontal abduction, serratus punch, dynamic hug (use yellow or red band).    OT Home Exercise Plan eval: scapular A/ROM 1/20: AA/ROM shoulder exercises 2/22: scapular clocks    Consulted and Agree with Plan of Care Patient           Patient will benefit from skilled therapeutic intervention in order to improve the following deficits and impairments:   Body Structure / Function / Physical Skills: ADL,Endurance,UE functional use,Fascial restriction,Pain,ROM,IADL,Strength       Visit Diagnosis: Chronic left shoulder pain  Other symptoms and signs involving the musculoskeletal system  Stiffness of left shoulder, not elsewhere  classified    Problem  List Patient Active Problem List   Diagnosis Date Noted  . Reduced vision 04/06/2019  . Right inguinal hernia 10/07/2018  . Renal calculus, right 10/07/2018  . Overweight (BMI 25.0-29.9) 01/21/2016  . Annual physical exam 10/12/2014  . Hypertension, essential 04/08/2014  . Alcoholic cardiomyopathy ([place]) 04/08/2014  . Hyperlipidemia   . Tobacco abuse   . Palpitations 01/05/2013  . Alcohol dependence ([place]) 02/21/2010  . MENTAL RETARDATION, MILD 01/12/2008    Ailene Ravel, OTR/L,CBIS  [phone]  10/17/2020, 10:03 AM  [place] [address] [place], [place], [address] Phone: [phone]   Fax:  [phone]  Name: Alexander Simmons MRN: [id number] Date of Birth: 07-Dec-1955

## 2020-10-18 ENCOUNTER — Ambulatory Visit (HOSPITAL_COMMUNITY): Payer: Medicare Other

## 2020-10-19 DIAGNOSIS — H524 Presbyopia: Secondary | ICD-10-CM | POA: Diagnosis not present

## 2020-10-19 DIAGNOSIS — H52223 Regular astigmatism, bilateral: Secondary | ICD-10-CM | POA: Diagnosis not present

## 2020-10-23 ENCOUNTER — Ambulatory Visit (HOSPITAL_COMMUNITY): Payer: Medicare Other

## 2020-10-23 ENCOUNTER — Telehealth (HOSPITAL_COMMUNITY): Payer: Self-pay

## 2020-10-23 NOTE — Telephone Encounter (Signed)
Called regarding no show and to return call. Louann Liv answered and informed therapist that transportation did not pick patient up for his appointment. Rescheduled for tomorrow at 1:45PM 10/24/20. Provided phone number for [facility]'s transportation and reminded her that she needs to call to schedule transportation for each appointment so they know. She verbalized understanding.   Ailene Ravel, OTR/L,CBIS  [phone]

## 2020-10-24 ENCOUNTER — Ambulatory Visit (HOSPITAL_COMMUNITY): Payer: Medicare Other | Attending: Orthopedic Surgery | Admitting: Specialist

## 2020-10-24 ENCOUNTER — Other Ambulatory Visit: Payer: Self-pay

## 2020-10-24 ENCOUNTER — Encounter (HOSPITAL_COMMUNITY): Payer: Self-pay | Admitting: Specialist

## 2020-10-24 DIAGNOSIS — M25512 Pain in left shoulder: Secondary | ICD-10-CM | POA: Insufficient documentation

## 2020-10-24 DIAGNOSIS — R29898 Other symptoms and signs involving the musculoskeletal system: Secondary | ICD-10-CM | POA: Diagnosis not present

## 2020-10-24 DIAGNOSIS — M25612 Stiffness of left shoulder, not elsewhere classified: Secondary | ICD-10-CM | POA: Insufficient documentation

## 2020-10-24 DIAGNOSIS — G8929 Other chronic pain: Secondary | ICD-10-CM | POA: Insufficient documentation

## 2020-10-24 NOTE — Therapy (Signed)
[place], [place], [address] Phone: [phone]   Fax:  [phone]  Occupational Therapy Treatment  Patient Details  Name: Alexander Simmons MRN: [id number] Date of Birth: 13-Sep-1955 Referring Provider (OT): Dr. Meredith Pel   Encounter Date: 10/24/2020   OT End of Session - 10/24/20 1622    Visit Number 8    Number of Visits 16    Date for OT Re-Evaluation 11/07/20    Authorization Type [employer]HC Medicare    Progress Note Due on Visit 16    OT Start Time 1515    OT Stop Time 1555    OT Time Calculation (min) 40 min    Activity Tolerance Patient tolerated treatment well    Behavior During Therapy [facility] for tasks assessed/performed           Past Medical History:  Diagnosis Date  . Alcohol abuse   . Allergy   . Cor pulmonale (HCC)    By echocardiography  . Glaucoma   . Hyperlipidemia   . Hypertension   . Small bowel obstruction ([place])   . Syncope 12/2009   Attributed to use of narcotics plus alcohol  . Tobacco abuse     Past Surgical History:  Procedure Laterality Date  . ABDOMINAL SURGERY    . COLONOSCOPY N/A 01/28/2013   Procedure: COLONOSCOPY;  Surgeon: Rogene Houston, MD;  [facility];  Service: Endoscopy;  Laterality: N/A;  1225  . PILONIDAL CYST EXCISION  2005   Dr. Irving Shows  . TOOTH EXTRACTION      There were no vitals filed for this visit.   Subjective Assessment - 10/24/20 1621    Subjective  S: i have been doing my exercises    Currently in Pain? Yes    Pain Score 2     Pain Location Shoulder    Pain Orientation Left    Pain Descriptors / Indicators Sore    Pain Type Chronic pain              [facility] OT Assessment - 10/24/20 0001      Assessment   Medical Diagnosis s/p left proximal humerus fx with axillary nerve injury    Referring Provider (OT) Dr. Meredith Pel      Precautions   Precautions None                    OT Treatments/Exercises  (OP) - 10/24/20 0001      Exercises   Exercises Shoulder      Shoulder Exercises: Supine   Protraction AAROM;10 reps    External Rotation AAROM;10 reps    Flexion AAROM;10 reps    ABduction AAROM;10 reps    Other Supine Exercises Attempted serratus anterior punch, max difficulty with form.  Max facilitation to complete 10 repetitions      Shoulder Exercises: Seated   Elevation AROM;10 reps    Row AROM;10 reps      Shoulder Exercises: Sidelying   External Rotation AROM;10 reps    External Rotation Limitations max facilitation for form    Internal Rotation AROM;10 reps    Flexion AROM;10 reps    Flexion Limitations to 90 max facilitation for form    ABduction AROM;10 reps    ABduction Limitations max facilitation for form    Other Sidelying Exercises protraction/retraction 10 times      Shoulder Exercises: Standing   Other Standing Exercises walked therapy ball up and  down wall, with left arm unable to go above shoulder height.      Shoulder Exercises: ROM/Strengthening   Other ROM/Strengthening Exercises dynamic hug with red theraband iwth max vg and min tactile cues for form.  unable to complete with left arm with good form    Other ROM/Strengthening Exercises push up plus - attempted on table, unable to complete with form with max pa.  stood up and practiced scapular slide into protraction retraction with hand over hand assist on patients shoulder blades, also had patient feel therapist shoulder blade while she completed.  max vg and max pa for technique      Manual Therapy   Manual Therapy Myofascial release    Manual therapy comments Manual therapy completed prior to exercises.    Myofascial Release Myofascial release and manual stretching completed to left upper arm, trapezius, and scapularis region to decrease fascial restrictions and increase joint mobility in a pain free zone.                    OT Short Term Goals - 10/17/20 1002      OT SHORT TERM GOAL #1    Title Pt will be provided with and educated on HEP to improve mobility of LUE required for ADL completion.    Time 4    Period Weeks    Status On-going    Target Date 10/05/20      OT SHORT TERM GOAL #2   Title Pt will decrease pain in LUE to 2/10 or less to improve ability to use LUE as assist when walking dog.    Time 4    Period Weeks      OT SHORT TERM GOAL #3   Title Pt will decrease LUE fascial restrictions to minimal amounts or less to improve mobility required for functional reaching tasks.    Time 4    Period Weeks      OT SHORT TERM GOAL #4   Title Pt will increase LUE A/ROM to 50-75% to improve ability to perform dressing and bathing tasks with greater ease.    Time 4    Period Weeks    Status On-going      OT SHORT TERM GOAL #5   Title Pt will increase LUE strength to 4-/5 or greater to improve ability to perform light lifting tasks within available ROM.    Time 4    Period Weeks    Status On-going                    Plan - 10/24/20 1622    Clinical Impression Statement A:  continues to have minimal to no scapular mobility present.  with max facilitation from OT patient still has non funcitonal mobility and scapulohumeral rhythm.  Attempted serratus anterior, push up plus and dynamic hug, however patient unable to complete without max pa and vg.    Body Structure / Function / Physical Skills ADL;Endurance;UE functional use;Fascial restriction;Pain;ROM;IADL;Strength    Plan P:  discuss current adl limitations and determine what patient would be able to do that he can't currently do within current range of motion limitations.  educate on compensatory techniques and/or improve independence with these functional tasks within patient's current range availability.           Patient will benefit from skilled therapeutic intervention in order to improve the following deficits and impairments:   Body Structure / Function / Physical Skills: ADL,Endurance,UE  functional use,Fascial restriction,Pain,ROM,IADL,Strength  Visit Diagnosis: Chronic left shoulder pain  Other symptoms and signs involving the musculoskeletal system  Stiffness of left shoulder, not elsewhere classified    Problem List Patient Active Problem List   Diagnosis Date Noted  . Reduced vision 04/06/2019  . Right inguinal hernia 10/07/2018  . Renal calculus, right 10/07/2018  . Overweight (BMI 25.0-29.9) 01/21/2016  . Annual physical exam 10/12/2014  . Hypertension, essential 04/08/2014  . Alcoholic cardiomyopathy ([place]) 04/08/2014  . Hyperlipidemia   . Tobacco abuse   . Palpitations 01/05/2013  . Alcohol dependence ([place]) 02/21/2010  . MENTAL RETARDATION, MILD 01/12/2008    Vangie Bicker, [place], OTR/L [phone]  10/24/2020, 4:26 PM  [place], [place], [address] Phone: [phone]   Fax:  [phone]  Name: PILAR CORRALES MRN: [id number] Date of Birth: Dec 01, 1955

## 2020-10-25 ENCOUNTER — Encounter (HOSPITAL_COMMUNITY): Payer: Self-pay

## 2020-10-25 ENCOUNTER — Ambulatory Visit (HOSPITAL_COMMUNITY): Payer: Medicare Other

## 2020-10-25 DIAGNOSIS — M25512 Pain in left shoulder: Secondary | ICD-10-CM | POA: Diagnosis not present

## 2020-10-25 DIAGNOSIS — G8929 Other chronic pain: Secondary | ICD-10-CM

## 2020-10-25 DIAGNOSIS — M25612 Stiffness of left shoulder, not elsewhere classified: Secondary | ICD-10-CM | POA: Diagnosis not present

## 2020-10-25 DIAGNOSIS — R29898 Other symptoms and signs involving the musculoskeletal system: Secondary | ICD-10-CM

## 2020-10-25 NOTE — Patient Instructions (Signed)
Discharge recommendations  Continue with the following home exercises program: AA/ROM shoulder exercises with the cane. Complete 1-2 times a day. Every day or every other day for completion.    *Discontinue the exercises provided at initial evaluation and scapular clocks provided at visit on 10/16/20.    Task modifications for reaching in kitchen cabinet:   1. Bring food items or dishes that are out of reach down to a lower level or shelf that is accessible.   2. Use a foldable 2 step stool to reach items that are out of reach in cabinet.   3. Use long handled reacher to retrieve items that are out of reach.   4. Use right arm to reach for items that are out of reach and use left arm for lower level reaching.

## 2020-10-26 NOTE — Therapy (Signed)
Alexander Simmons [address] [place], [place], [address] Phone: [phone]   Fax:  [phone]  Occupational Therapy Treatment Discharge summary Patient Details  Name: Alexander Simmons MRN: [id number] Date of Birth: May 10, 1956 Referring Provider (OT): Dr. Meredith Pel   Encounter Date: 10/25/2020   OT End of Session - 10/26/20 1101    Visit Number 9    Number of Visits 16    Authorization Type [employer] Medicare    Progress Note Due on Visit 16    OT Start Time 1645    OT Stop Time 1723    OT Time Calculation (min) 38 min    Activity Tolerance Patient tolerated treatment well    Behavior During Therapy [facility] for tasks assessed/performed           Past Medical History:  Diagnosis Date  . Alcohol abuse   . Allergy   . Cor pulmonale (HCC)    By echocardiography  . Glaucoma   . Hyperlipidemia   . Hypertension   . Small bowel obstruction ([place])   . Syncope 12/2009   Attributed to use of narcotics plus alcohol  . Tobacco abuse     Past Surgical History:  Procedure Laterality Date  . ABDOMINAL SURGERY    . COLONOSCOPY N/A 01/28/2013   Procedure: COLONOSCOPY;  Surgeon: Rogene Houston, MD;  Location: [facility];  Service: Endoscopy;  Laterality: N/A;  1225  . PILONIDAL CYST EXCISION  2005   Dr. Irving Shows  . TOOTH EXTRACTION      There were no vitals filed for this visit.   Subjective Assessment - 10/25/20 1700    Subjective  S: Reaching for things in the cabinet is hard.    Currently in Pain? 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 Retrieved food boxes from second shelf of cabinet while using a foldable 2 step ladder to increase patient's and decrease range of reach required. Complete same activity without use of step ladder. While standing patient used long handled reacher in left hand to retrieve foox boxes from second shelf. Returned items in same fashion.    General Comments --      Exercises   Exercises Shoulder      Shoulder Exercises: Seated   Protraction AAROM;15 reps    Horizontal ABduction AAROM;15 reps    External Rotation AAROM;15 reps    Internal Rotation AAROM;15 reps    Flexion AAROM;15 reps    Abduction AAROM;15 reps                  OT Education - 10/26/20 1059    Education Details Discussed physical limitations that are present due to possible extent of injury and/or late start to therapy. Discussed focusing on exploring compensatory strategies for ADL tasks due to ROM limitations. Compensatory strategies reviewed related to reaching in kitchen cabinet. HEP reviewed. Recommended only completing AA/ROM shoulder  exercises once a day if possible.    Person(s) Educated Patient    Methods Explanation;Demonstration;Handout;Verbal cues    Comprehension Verbalized understanding;Returned demonstration            OT Short Term Goals - 10/26/20 1200      OT SHORT TERM GOAL #1   Title Pt will be provided with and educated on HEP to improve mobility of LUE required for ADL completion.    Time 4    Period Weeks    Status Achieved    Target Date 10/05/20      OT SHORT TERM GOAL #2   Title Pt will decrease pain in LUE to 2/10 or less to improve ability to use LUE as assist when walking dog.    Time 4    Period Weeks      OT SHORT TERM GOAL #3   Title Pt will decrease LUE fascial restrictions to minimal amounts or less to improve mobility required for functional reaching tasks.    Time 4    Period Weeks      OT SHORT TERM GOAL #4   Title Pt will increase LUE A/ROM to 50-75%  to improve ability to perform dressing and bathing tasks with greater ease.    Time 4    Period Weeks    Status Not Met      OT SHORT TERM GOAL #5   Title Pt will increase LUE strength to 4-/5 or greater to improve ability to perform light lifting tasks within available ROM.    Time 4    Period Weeks    Status Not Met                    Plan - 10/26/20 1101    Clinical Impression Statement A: Due to recent reassessment and no significant change in ROM measurements and strength discussed focusing on compensatory strategies that patient may use that will increase his functional performance while using LUE limitations. Patient reports only difficulty with LUE is reaching into cabinets in kitchen. Spent session discussing and practicing compensatory strategies such as changing location of food items to a lower shelf or location, using a reacher to retrieve items, using a 2 step ladder in the kitchen, or using right arm to reach above shoulder level and use left arm to reach under shoulder level. Reviewed HEP and provided handout of recommendations reviewed during session. Patient met 3/5 therapy goals. He understands recommendation and education provided today. patient is in agreement with discharge recommendation.    Body Structure / Function / Physical Skills ADL;Endurance;UE functional use;Fascial restriction;Pain;ROM;IADL;Strength    Plan P: D/C from OT services with HEP. Incorporate compensatory strategies from this session if wanted.    Consulted and Agree with Plan of Care Patient           Patient will benefit from skilled therapeutic intervention in order to improve the following deficits and impairments:   Body Structure / Function / Physical Skills: ADL,Endurance,UE functional use,Fascial restriction,Pain,ROM,IADL,Strength       Visit Diagnosis: Chronic left shoulder pain  Other symptoms and signs involving the musculoskeletal system  Stiffness of left shoulder, not  elsewhere classified    Problem List Patient Active Problem List   Diagnosis Date Noted  . Reduced vision 04/06/2019  . Right inguinal hernia 10/07/2018  . Renal calculus, right 10/07/2018  . Overweight (BMI 25.0-29.9) 01/21/2016  . Annual physical exam 10/12/2014  . Hypertension, essential 04/08/2014  . Alcoholic cardiomyopathy ([place])  04/08/2014  . Hyperlipidemia   . Tobacco abuse   . Palpitations 01/05/2013  . Alcohol dependence ([place]) 02/21/2010  . MENTAL RETARDATION, MILD 01/12/2008   OCCUPATIONAL THERAPY DISCHARGE SUMMARY  Visits from Start of Care: 9  Current functional level related to goals / functional outcomes: Patient is able to complete all functional activities with the left UE that are below shoulder level. He has demonstrated a very small increase with ROM and strength since starting therapy although due to lack of Scapulohumeral Rhythm he is unable to increase his ability to reach.    Remaining deficits: Patient has limitations with A/ROM, P/ROM, and strength with the LUE. Difficulty with any reaching at or above shoulder level.   Education / Equipment: See above Plan: Patient agrees to discharge.  Patient goals were partially met. Patient is being discharged due to being pleased with the current functional level.  ?????Unable to achieve further ROM with LUE.            Ailene Ravel, OTR/L,CBIS  [phone]  10/26/2020, 12:07 PM  [facility] [address] [place], [place], [address] Phone: [phone]   Fax:  [phone]  Name: Alexander Simmons MRN: [id number] Date of Birth: 10-Sep-1955

## 2020-10-30 ENCOUNTER — Encounter (HOSPITAL_COMMUNITY): Payer: Medicare Other

## 2020-11-01 ENCOUNTER — Encounter (HOSPITAL_COMMUNITY): Payer: Medicare Other

## 2020-11-06 ENCOUNTER — Encounter (HOSPITAL_COMMUNITY): Payer: Medicare Other

## 2020-11-08 ENCOUNTER — Encounter (HOSPITAL_COMMUNITY): Payer: Medicare Other

## 2020-11-09 ENCOUNTER — Ambulatory Visit: Payer: Medicare Other | Admitting: Family Medicine

## 2020-11-09 ENCOUNTER — Ambulatory Visit: Payer: Medicare Other | Admitting: Nurse Practitioner

## 2020-11-21 ENCOUNTER — Ambulatory Visit (INDEPENDENT_AMBULATORY_CARE_PROVIDER_SITE_OTHER): Payer: Medicare Other | Admitting: Nurse Practitioner

## 2020-11-21 ENCOUNTER — Telehealth: Payer: Self-pay

## 2020-11-21 ENCOUNTER — Other Ambulatory Visit: Payer: Self-pay

## 2020-11-21 ENCOUNTER — Encounter: Payer: Self-pay | Admitting: Nurse Practitioner

## 2020-11-21 VITALS — BP 156/94 | HR 73 | Temp 98.5°F | Resp 20 | Ht 64.5 in | Wt 148.0 lb

## 2020-11-21 DIAGNOSIS — F10288 Alcohol dependence with other alcohol-induced disorder: Secondary | ICD-10-CM

## 2020-11-21 DIAGNOSIS — E785 Hyperlipidemia, unspecified: Secondary | ICD-10-CM | POA: Diagnosis not present

## 2020-11-21 DIAGNOSIS — I10 Essential (primary) hypertension: Secondary | ICD-10-CM

## 2020-11-21 DIAGNOSIS — R7301 Impaired fasting glucose: Secondary | ICD-10-CM | POA: Diagnosis not present

## 2020-11-21 DIAGNOSIS — Z748 Other problems related to care provider dependency: Secondary | ICD-10-CM | POA: Diagnosis not present

## 2020-11-21 MED ORDER — AMLODIPINE BESYLATE 5 MG PO TABS: 5.0000 mg | ORAL_TABLET | Freq: Every day | ORAL | 3 refills | Status: DC

## 2020-11-21 NOTE — Assessment & Plan Note (Signed)
-  had elevated trigs at last visit and was advised to cut back on fried and fatty foods -will check lipids today

## 2020-11-21 NOTE — Progress Notes (Signed)
Established Patient Office Visit  Subjective:  Patient ID: Alexander Simmons, male    DOB: [birth year]  Age: 65 y.o. MRN: [id number]  CC:  Chief Complaint  Patient presents with  . Hypertension    Follow up    HPI Alexander Simmons presents for BP check.  At last OV, BP was elevated. He has been taking metoprolol.  He chews tobacco and drinks alcohol.  He states he uses a can of dip per day and a pint of gin lasts him 2 weeks.  Past Medical History:  Diagnosis Date  . Alcohol abuse   . Allergy   . Cor pulmonale (HCC)    By echocardiography  . Glaucoma   . Hyperlipidemia   . Hypertension   . Small bowel obstruction ([place])   . Syncope 12/2009   Attributed to use of narcotics plus alcohol  . Tobacco abuse     Past Surgical History:  Procedure Laterality Date  . ABDOMINAL SURGERY    . COLONOSCOPY N/A 01/28/2013   Procedure: COLONOSCOPY;  Surgeon: Rogene Houston, MD;  [facility];  Service: Endoscopy;  Laterality: N/A;  1225  . PILONIDAL CYST EXCISION  2005   Dr. Irving Shows  . TOOTH EXTRACTION      Family History  Problem Relation Age of Onset  . Other Other        FH unknown-patient raised in foster care system  . Colon cancer Neg Hx     Social History   Socioeconomic History  . Marital status: Single    Spouse name: Not on file  . Number of children: 1  . Years of education: 59  . Highest education level: 12th grade  Occupational History  . Occupation: disabled   Tobacco Use  . Smoking status: Never Smoker  . Smokeless tobacco: Current User    Types: Snuff, Chew  . Tobacco comment: chews tobacco since age 38.  Vaping Use  . Vaping Use: Former  Substance and Sexual Activity  . Alcohol use: Yes    Alcohol/week: 20.0 standard drinks    Types: 20 Shots of liquor per week    Comment: 1 pint of gin daily (4cups daily)  . Drug use: No  . Sexual activity: Not Currently  Other Topics Concern  . Not on file  Social History Narrative   Lives with his  niece    Social Determinants of Health   Financial Resource Strain: Low Risk   . Difficulty of Paying Living Expenses: Not hard at all  Food Insecurity: No Food Insecurity  . Worried About Charity fundraiser in the Last Year: Never true  . Ran Out of Food in the Last Year: Never true  Transportation Needs: No Transportation Needs  . Lack of Transportation (Medical): No  . Lack of Transportation (Non-Medical): No  Physical Activity: Insufficiently Active  . Days of Exercise per Week: 3 days  . Minutes of Exercise per Session: 30 min  Stress: No Stress Concern Present  . Feeling of Stress : Not at all  Social Connections: Moderately Isolated  . Frequency of Communication with Friends and Family: More than three times a week  . Frequency of Social Gatherings with Friends and Family: More than three times a week  . Attends Religious Services: Never  . Active Member of Clubs or Organizations: Yes  . Attends Archivist Meetings: More than 4 times per year  . Marital Status: Never married  Intimate Partner Violence: Not  At Risk  . Fear of Current or Ex-Partner: No  . Emotionally Abused: No  . Physically Abused: No  . Sexually Abused: No    Outpatient Medications Prior to Visit  Medication Sig Dispense Refill  . ergocalciferol (VITAMIN D2) 1.25 MG (50000 UT) capsule Take 1 capsule (50,000 Units total) by mouth once a week. One capsule once weekly 12 capsule 1  . metoprolol tartrate (LOPRESSOR) 25 MG tablet Take 1 tablet by mouth twice daily 180 tablet 0   No facility-administered medications prior to visit.    No Known Allergies  ROS Review of Systems  Constitutional: Negative.   Respiratory: Negative.   Cardiovascular: Negative.   Psychiatric/Behavioral: Negative.        -still using 1 can of chewing tobacco per day and 1 pint of gin every 2 weeks      Objective:    Physical Exam Constitutional:      Appearance: Normal appearance.  Cardiovascular:     Rate  and Rhythm: Normal rate and regular rhythm.     Pulses: Normal pulses.     Heart sounds: Normal heart sounds.  Pulmonary:     Effort: Pulmonary effort is normal.     Breath sounds: Normal breath sounds.  Neurological:     Mental Status: He is alert.  Psychiatric:        Mood and Affect: Mood normal.        Behavior: Behavior normal.        Thought Content: Thought content normal.        Judgment: Judgment normal.     BP (!) 156/94   Pulse 73   Temp 98.5 F (36.9 C)   Resp 20   Ht 5' 4.5" (1.638 m)   Wt 148 lb (67.1 kg)   SpO2 98%   BMI 25.01 kg/m  Wt Readings from Last 3 Encounters:  11/21/20 148 lb (67.1 kg)  10/03/20 150 lb (68 kg)  07/04/20 143 lb 3.2 oz (65 kg)     There are no preventive care reminders to display for this patient.  There are no preventive care reminders to display for this patient.  Lab Results  Component Value Date   TSH 2.32 05/17/2020   Lab Results  Component Value Date   WBC 3.7 (L) 05/17/2020   HGB 12.5 (L) 05/17/2020   HCT 37.2 (L) 05/17/2020   MCV 99.5 05/17/2020   PLT 246 05/17/2020   Lab Results  Component Value Date   NA 140 05/17/2020   K 3.7 05/17/2020   CO2 20 05/17/2020   GLUCOSE 104 (H) 05/17/2020   BUN 9 05/17/2020   CREATININE 1.03 05/17/2020   BILITOT 0.8 05/17/2020   ALKPHOS 51 08/26/2018   AST 20 05/17/2020   ALT 9 05/17/2020   PROT 6.6 05/17/2020   ALBUMIN 3.7 08/26/2018   CALCIUM 9.2 05/17/2020   ANIONGAP 8 08/26/2018   Lab Results  Component Value Date   CHOL 156 05/17/2020   Lab Results  Component Value Date   HDL 59 05/17/2020   Lab Results  Component Value Date   LDLCALC 68 05/17/2020   Lab Results  Component Value Date   TRIG 234 (H) 05/17/2020   Lab Results  Component Value Date   CHOLHDL 2.6 05/17/2020   Lab Results  Component Value Date   HGBA1C 5.4 10/13/2018      Assessment & Plan:   Problem List Items Addressed This Visit      Cardiovascular and Mediastinum  Hypertension, essential    BP Readings from Last 3 Encounters:  07/04/20 (!) 171/99  05/11/20 136/75  03/30/20 (!) 148/91  -BP today 156/94 -Rx. amlodipine      Relevant Medications   amLODipine (NORVASC) 5 MG tablet   Other Relevant Orders   CMP14+EGFR   CBC with Differential/Platelet   Lipid Panel With LDL/HDL Ratio     Other   Alcohol dependence ([place])    -he states that he is drinking 1 pint of gin every 2 weeks      Hyperlipidemia    -had elevated trigs at last visit and was advised to cut back on fried and fatty foods -will check lipids today      Relevant Medications   amLODipine (NORVASC) 5 MG tablet   Other Relevant Orders   Lipid Panel With LDL/HDL Ratio   Assistance with transportation    -we are working on paperwork for [facility] for his medical appointments       Other Visit Diagnoses    Impaired fasting glucose    -  Primary   Relevant Orders   Hemoglobin A1c      Meds ordered this encounter  Medications  . amLODipine (NORVASC) 5 MG tablet    Sig: Take 1 tablet (5 mg total) by mouth daily.    Dispense:  90 tablet    Refill:  3    Follow-up: Return in about 1 month (around 12/22/2020) for BP check.    Noreene Larsson, NP

## 2020-11-21 NOTE — Assessment & Plan Note (Signed)
-  he states that he is drinking 1 pint of gin every 2 weeks

## 2020-11-21 NOTE — Telephone Encounter (Signed)
Patient is needing help with transportation to and from doctors appointments is this something we help with or do referrals for ? Ph# [phone]

## 2020-11-21 NOTE — Assessment & Plan Note (Addendum)
BP Readings from Last 3 Encounters:  07/04/20 (!) 171/99  05/11/20 136/75  03/30/20 (!) 148/91  -BP today 156/94 -Rx. amlodipine

## 2020-11-21 NOTE — Patient Instructions (Signed)
I ordered lab work today. It should be fasting, so no food or drink (other than water or black coffee) in the 8 hours prior to the lab draw. If you are fasting this can be done today, or we can draw it at your next appointment in 1 month.

## 2020-11-21 NOTE — Assessment & Plan Note (Signed)
-  we are working on paperwork for [facility] for his medical appointments

## 2020-11-21 NOTE — Telephone Encounter (Signed)
He just seen Pearline Cables and was given [facility] form with number to call (per [place])

## 2020-11-28 ENCOUNTER — Ambulatory Visit: Payer: Medicare Other | Admitting: Nurse Practitioner

## 2021-01-03 ENCOUNTER — Other Ambulatory Visit: Payer: Self-pay

## 2021-01-03 ENCOUNTER — Ambulatory Visit: Payer: Medicare Other | Admitting: Nurse Practitioner

## 2021-01-03 DIAGNOSIS — R7301 Impaired fasting glucose: Secondary | ICD-10-CM | POA: Diagnosis not present

## 2021-01-03 DIAGNOSIS — I10 Essential (primary) hypertension: Secondary | ICD-10-CM | POA: Diagnosis not present

## 2021-01-03 DIAGNOSIS — E785 Hyperlipidemia, unspecified: Secondary | ICD-10-CM | POA: Diagnosis not present

## 2021-01-04 LAB — CBC WITH DIFFERENTIAL/PLATELET
Basophils Absolute: 0.1 10*3/uL (ref 0.0–0.2)
Basos: 1 %
EOS (ABSOLUTE): 0.1 10*3/uL (ref 0.0–0.4)
Eos: 1 %
Hematocrit: 39.9 % (ref 37.5–51.0)
Hemoglobin: 14 g/dL (ref 13.0–17.7)
Immature Grans (Abs): 0 10*3/uL (ref 0.0–0.1)
Immature Granulocytes: 0 %
Lymphocytes Absolute: 1.7 10*3/uL (ref 0.7–3.1)
Lymphs: 33 %
MCH: 34.1 pg — ABNORMAL HIGH (ref 26.6–33.0)
MCHC: 35.1 g/dL (ref 31.5–35.7)
MCV: 97 fL (ref 79–97)
Monocytes Absolute: 0.6 10*3/uL (ref 0.1–0.9)
Monocytes: 11 %
Neutrophils Absolute: 2.7 10*3/uL (ref 1.4–7.0)
Neutrophils: 54 %
Platelets: 308 10*3/uL (ref 150–450)
RBC: 4.1 x10E6/uL — ABNORMAL LOW (ref 4.14–5.80)
RDW: 13.1 % (ref 11.6–15.4)
WBC: 5.1 10*3/uL (ref 3.4–10.8)

## 2021-01-04 LAB — LIPID PANEL WITH LDL/HDL RATIO
Cholesterol, Total: 176 mg/dL (ref 100–199)
HDL: 66 mg/dL (ref >39)
LDL Chol Calc (NIH): 82 mg/dL (ref 0–99)
LDL/HDL Ratio: 1.2 ratio (ref 0.0–3.6)
Triglycerides: 163 mg/dL — ABNORMAL HIGH (ref 0–149)
VLDL Cholesterol Cal: 28 mg/dL (ref 5–40)

## 2021-01-04 LAB — CMP14+EGFR
ALT: 27 IU/L (ref 0–44)
AST: 27 IU/L (ref 0–40)
Albumin/Globulin Ratio: 1.8 (ref 1.2–2.2)
Albumin: 4.8 g/dL (ref 3.8–4.8)
Alkaline Phosphatase: 111 IU/L (ref 44–121)
BUN/Creatinine Ratio: 8 — ABNORMAL LOW (ref 10–24)
BUN: 10 mg/dL (ref 8–27)
Bilirubin Total: 0.4 mg/dL (ref 0.0–1.2)
CO2: 20 mmol/L (ref 20–29)
Calcium: 10 mg/dL (ref 8.6–10.2)
Chloride: 103 mmol/L (ref 96–106)
Creatinine, Ser: 1.23 mg/dL (ref 0.76–1.27)
Globulin, Total: 2.6 g/dL (ref 1.5–4.5)
Glucose: 111 mg/dL — ABNORMAL HIGH (ref 65–99)
Potassium: 3.8 mmol/L (ref 3.5–5.2)
Sodium: 139 mmol/L (ref 134–144)
Total Protein: 7.4 g/dL (ref 6.0–8.5)
eGFR: 65 mL/min/{1.73_m2} (ref >59)

## 2021-01-04 LAB — HEMOGLOBIN A1C
Est. average glucose Bld gHb Est-mCnc: 117 mg/dL
Hgb A1c MFr Bld: 5.7 % — ABNORMAL HIGH (ref 4.8–5.6)

## 2021-01-04 NOTE — Progress Notes (Signed)
A1c is 5.7%, so that is in the prediabetic range. Increase exercise and cut back on soda and sugary foods, and that will help lower your A1c. No need to add or change medicines.

## 2021-01-08 ENCOUNTER — Ambulatory Visit: Payer: Medicare Other | Admitting: Nurse Practitioner

## 2021-01-10 ENCOUNTER — Ambulatory Visit: Payer: Medicare Other | Admitting: Family Medicine

## 2021-01-11 ENCOUNTER — Other Ambulatory Visit: Payer: Self-pay

## 2021-01-11 ENCOUNTER — Telehealth: Payer: Self-pay | Admitting: *Deleted

## 2021-01-11 ENCOUNTER — Encounter: Payer: Self-pay | Admitting: Family Medicine

## 2021-01-11 ENCOUNTER — Ambulatory Visit (INDEPENDENT_AMBULATORY_CARE_PROVIDER_SITE_OTHER): Payer: Medicare Other | Admitting: Family Medicine

## 2021-01-11 VITALS — BP 113/73 | Resp 15 | Ht 64.5 in | Wt 149.4 lb

## 2021-01-11 DIAGNOSIS — Z748 Other problems related to care provider dependency: Secondary | ICD-10-CM

## 2021-01-11 DIAGNOSIS — E785 Hyperlipidemia, unspecified: Secondary | ICD-10-CM | POA: Diagnosis not present

## 2021-01-11 DIAGNOSIS — Z720 Tobacco use: Secondary | ICD-10-CM | POA: Diagnosis not present

## 2021-01-11 DIAGNOSIS — F17200 Nicotine dependence, unspecified, uncomplicated: Secondary | ICD-10-CM

## 2021-01-11 DIAGNOSIS — Z125 Encounter for screening for malignant neoplasm of prostate: Secondary | ICD-10-CM

## 2021-01-11 DIAGNOSIS — E559 Vitamin D deficiency, unspecified: Secondary | ICD-10-CM | POA: Diagnosis not present

## 2021-01-11 DIAGNOSIS — F10288 Alcohol dependence with other alcohol-induced disorder: Secondary | ICD-10-CM | POA: Diagnosis not present

## 2021-01-11 DIAGNOSIS — E663 Overweight: Secondary | ICD-10-CM

## 2021-01-11 DIAGNOSIS — M25512 Pain in left shoulder: Secondary | ICD-10-CM

## 2021-01-11 DIAGNOSIS — I10 Essential (primary) hypertension: Secondary | ICD-10-CM | POA: Diagnosis not present

## 2021-01-11 DIAGNOSIS — G8929 Other chronic pain: Secondary | ICD-10-CM

## 2021-01-11 NOTE — Patient Instructions (Addendum)
Annual exam with MD Sept 27 or after , call if you need me sooner, flu vaccine at visit and pneumonia 23 vaccines at that visit  Need covid booster , please get this  Nurse please get covid vaccine  history from [facility] on [address] and document  You are referred to Orthopedics in [place] gum instead of dipping snuff after eating, you need to quit  Please stop gin altogether, no alcohol, to protect your heart  Nurse please refer for transport assistance too medical appts  Non fast chem 7 and E[facility], tSH, PSA and vit D Sept 23 or after  Thanks for choosing [facility], we consider it a privelige to serve you.

## 2021-01-11 NOTE — Assessment & Plan Note (Signed)
  Patient re-educated about  the importance of commitment to a  minimum of 150 minutes of exercise per week as able.  The importance of healthy food choices with portion control discussed, as well as eating regularly and within a 12 hour window most days. The need to choose "clean , green" food 50 to 75% of the time is discussed, as well as to make water the primary drink and set a goal of 64 ounces water daily.    Weight /BMI 01/11/2021 11/21/2020 10/03/2020  WEIGHT 149 lb 6.4 oz 148 lb 150 lb  HEIGHT 5' 4.5" 5' 4.5" 5\' 1"   BMI 25.25 kg/m2 25.01 kg/m2 28.34 kg/m2

## 2021-01-11 NOTE — Progress Notes (Signed)
Alexander Simmons     MRN: [id number]      DOB: Jun 01, 1956   HPI Alexander Simmons is here for follow up and re-evaluation of chronic medical conditions, medication management and review of any available recent lab and radiology data.  Preventive health is updated, specifically  Cancer screening and Immunization.   C/o chronic leftt shoulder pain with reduced mobility, minimal improvement after prolonged PT. Requests assistance with transport to medical appointments Still dips snuff, has done so for 60 years, states uses after meals, cutting back and wants to quit. Reports drinking gin 2 days/ week, which is less than in the past  ROS Denies recent fever or chills. Denies sinus pressure, nasal congestion, ear pain or sore throat. Denies chest congestion, productive cough or wheezing. Denies chest pains, palpitations and leg swelling Denies abdominal pain, nausea, vomiting,diarrhea or constipation.   Denies dysuria, frequency, hesitancy or incontinence.  Denies headaches, seizures, numbness, or tingling. Denies depression, anxiety or insomnia. Denies skin break down or rash.   PE  BP 113/73   Resp 15   Ht 5' 4.5" (1.638 m)   Wt 149 lb 6.4 oz (67.8 kg)   SpO2 97%   BMI 25.25 kg/m   Patient alert and oriented and in no cardiopulmonary distress.  HEENT: No facial asymmetry, EOMI,     Neck supple .  Chest: Clear to auscultation bilaterally.  CVS: S1, S2 no murmurs, no S3.Regular rate.  ABD: Soft non tender.   Ext: No edema  MS: Adequate ROM spine,  hips and knees.Mrkedly reduced in left shoulder  Skin: Intact, no ulcerations or rash noted.  Psych: Good eye contact, normal affect. Memory intact not anxious or depressed appearing.  CNS: CN 2-12 intact, power,  normal throughout.no focal deficits noted.   Assessment & Plan  Tobacco abuse Still dipping snuff, trying to stop, dips after he eats Asked:confirms currently uses snuff daily Assess: Unwilling to set a quit date, but  is cutting back Advise: needs to QUIT to reduce risk of cancer, cardio and cerebrovascular disease Assist: counseled for 5 minutes and literature provided Arrange: follow up in 2 to 4 months   Alcohol dependence ([place]) Cutting back and trying to quit, reports gin twice weekly, the fact that he has heart damage from alcohol is dicussed and his need to quit  Assistance with transportation Needs to be signed up for [facility] will follow through with case worker/social work, incapable of transporting Alexander Simmons, missed appt yesterday because of transport  Hypertension, essential Controlled, no change in medication DASH diet and commitment to daily physical activity for a minimum of 30 minutes discussed and encouraged, as a part of hypertension management. The importance of attaining a healthy weight is also discussed.  BP/Weight 01/11/2021 11/21/2020 10/03/2020 07/04/2020 05/11/2020 03/30/2020 2/53/6644  Systolic BP 034 742 - 595 638 756 433  Diastolic BP 73 94 - 99 75 91 85  Wt. (Lbs) 149.4 148 150 143.2 143 143 148  BMI 25.25 25.01 28.34 25.37 25.33 25.33 26.22       Shoulder pain, left Chronic and unchanged despite long course of PT, has abn MRI, refer for 2nd Ortho opinion  Overweight (BMI 25.0-29.9)  Patient re-educated about  the importance of commitment to a  minimum of 150 minutes of exercise per week as able.  The importance of healthy food choices with portion control discussed, as well as eating regularly and within a 12 hour window most days. The need to choose "clean , green" food  50 to 75% of the time is discussed, as well as to make water the primary drink and set a goal of 64 ounces water daily.    Weight /BMI 01/11/2021 11/21/2020 10/03/2020  WEIGHT 149 lb 6.4 oz 148 lb 150 lb  HEIGHT 5' 4.5" 5' 4.5" 5\' 1"   BMI 25.25 kg/m2 25.01 kg/m2 28.34 kg/m2

## 2021-01-11 NOTE — Assessment & Plan Note (Signed)
Chronic and unchanged despite long course of PT, has abn MRI, refer for 2nd Ortho opinion

## 2021-01-11 NOTE — Assessment & Plan Note (Addendum)
Still dipping snuff, trying to stop, dips after he eats Asked:confirms currently uses snuff daily Assess: Unwilling to set a quit date, but is cutting back Advise: needs to QUIT to reduce risk of cancer, cardio and cerebrovascular disease Assist: counseled for 5 minutes and literature provided Arrange: follow up in 2 to 4 months

## 2021-01-11 NOTE — Assessment & Plan Note (Signed)
Controlled, no change in medication DASH diet and commitment to daily physical activity for a minimum of 30 minutes discussed and encouraged, as a part of hypertension management. The importance of attaining a healthy weight is also discussed.  BP/Weight 01/11/2021 11/21/2020 10/03/2020 07/04/2020 05/11/2020 03/30/2020 6/38/7564  Systolic BP 332 951 - 884 166 063 016  Diastolic BP 73 94 - 99 75 91 85  Wt. (Lbs) 149.4 148 150 143.2 143 143 148  BMI 25.25 25.01 28.34 25.37 25.33 25.33 26.22

## 2021-01-11 NOTE — Assessment & Plan Note (Addendum)
Needs to be signed up for [facility] will follow through with case worker/social work, incapable of transporting himseld, missed appt yesterday because of transport

## 2021-01-11 NOTE — Telephone Encounter (Signed)
   Telephone encounter was:  Successful.  01/11/2021 Name: HAREL REPETTO MRN: [id number] DOB: [birth year]  Hunt Oris Cargile is a 65 y.o. year old male who is a primary care patient of Moshe Cipro Norwood Levo, MD . The community resource team was consulted for assistance with Transportation Needs   Care guide performed the following interventions: Patient provided with information about care guide support team and interviewed to confirm resource needs.  Follow Up Plan:  No further follow up planned at this time. The patient has been provided with needed resources. Patient has no appointments until sept so will call back told him that transportaion is provided both through [employer] and [place], Care Management  [phone] [address] , [place] [address] Email : [email or web address]

## 2021-01-11 NOTE — Assessment & Plan Note (Addendum)
Cutting back and trying to quit, reports gin twice weekly, the fact that he has heart damage from alcohol is dicussed and his need to quit

## 2021-01-12 ENCOUNTER — Ambulatory Visit: Payer: Medicare Other | Admitting: Family Medicine

## 2021-01-29 ENCOUNTER — Encounter: Payer: Self-pay | Admitting: Orthopedic Surgery

## 2021-01-29 ENCOUNTER — Other Ambulatory Visit: Payer: Self-pay

## 2021-01-29 ENCOUNTER — Ambulatory Visit (INDEPENDENT_AMBULATORY_CARE_PROVIDER_SITE_OTHER): Payer: Medicare Other | Admitting: Orthopedic Surgery

## 2021-01-29 ENCOUNTER — Ambulatory Visit: Payer: Medicare Other

## 2021-01-29 VITALS — BP 158/84 | HR 114 | Ht 61.0 in | Wt 150.0 lb

## 2021-01-29 DIAGNOSIS — G8929 Other chronic pain: Secondary | ICD-10-CM | POA: Diagnosis not present

## 2021-01-29 DIAGNOSIS — M25512 Pain in left shoulder: Secondary | ICD-10-CM | POA: Diagnosis not present

## 2021-01-29 NOTE — Progress Notes (Signed)
Proximal humerus fracture patient has seen shoulder specialist and then nerve has been injured and cannot fix  Referral from primary care  Patient wanted to be seen because shoulder specialist indicated that nothing treatment done  He does not complain of pain but complains of soreness and complaints of inability to abduct or flex her left shoulder  He had an injury for which I saw him back in May 2021 eventually saw Dr. Marlou Sa MRI and CT scan done  Fracture apparently healed the axillary nerve without  I talked to Mr. Honeyman about this I told him we are so sorry that we cannot fix his shoulder but as long as the nerve is not functioning shoulder surgery would not help  Although disappointed he seemed  to except this    And was   discharge   Last note from Dr Marlou Sa  plan: Impression is left shoulder proximal humerus fracture which has healed.  Shoulder is located.  Axillary nerve injury persists.  I do not think this is going to improve as there has been no improvement in the nerve conduction studies over 2 sequential studies.  This is something that he can live with.  He is not in pain.  Reverse replacement not indicated due to absence of axillary nerve function.  Follow-up as needed.

## 2021-01-29 NOTE — Patient Instructions (Signed)
I m so sorry the nerve has been injured and therefore the shoulder can not be fixed

## 2021-02-06 ENCOUNTER — Other Ambulatory Visit: Payer: Self-pay

## 2021-02-06 ENCOUNTER — Telehealth (INDEPENDENT_AMBULATORY_CARE_PROVIDER_SITE_OTHER): Payer: Medicare Other | Admitting: Internal Medicine

## 2021-02-06 ENCOUNTER — Encounter: Payer: Self-pay | Admitting: Internal Medicine

## 2021-02-06 DIAGNOSIS — J069 Acute upper respiratory infection, unspecified: Secondary | ICD-10-CM | POA: Diagnosis not present

## 2021-02-06 DIAGNOSIS — Z20822 Contact with and (suspected) exposure to covid-19: Secondary | ICD-10-CM

## 2021-02-06 NOTE — Progress Notes (Signed)
Virtual Visit via Telephone Note   This visit type was conducted due to national recommendations for restrictions regarding the COVID-19 Pandemic (e.g. social distancing) in an effort to limit this patient's exposure and mitigate transmission in our community.  Due to his co-morbid illnesses, this patient is at least at moderate risk for complications without adequate follow up.  This format is felt to be most appropriate for this patient at this time.  The patient did not have access to video technology/had technical difficulties with video requiring transitioning to audio format only (telephone).  All issues noted in this document were discussed and addressed.  No physical exam could be performed with this format.   Evaluation Performed:  Follow-up visit  Date:  02/06/2021   ID:  Alexander Simmons, Alexander Simmons [birth year], MRN [id number]  Patient Location: Home Provider Location: Office/Clinic  Participants: Patient and cousin - Louann Liv Location of Patient: Home Location of Provider: Telehealth Consent was obtain for visit to be over via telehealth. I verified that I am speaking with the correct person using two identifiers.  PCP:  Fayrene Helper, MD   Chief Complaint:  Cough and nasal congestion  History of Present Illness:    Alexander Simmons is a 65 y.o. male who has a televisit for c/o cough and nasal congestion for about a week. He has not had COVID test yet. Denies any fever, chills, dyspnea or wheezing.  The patient does have symptoms concerning for COVID-19 infection (fever, chills, cough, or new shortness of breath).   Past Medical, Surgical, Social History, Allergies, and Medications have been Reviewed.  Past Medical History:  Diagnosis Date   Alcohol abuse    Allergy    Cor pulmonale (HCC)    By echocardiography   Glaucoma    Hyperlipidemia    Hypertension    Small bowel obstruction ([place])    Syncope 12/2009   Attributed to use of narcotics plus alcohol   Tobacco  abuse    Past Surgical History:  Procedure Laterality Date   ABDOMINAL SURGERY     COLONOSCOPY N/A 01/28/2013   Procedure: COLONOSCOPY;  Surgeon: Rogene Houston, MD;  Location: [facility];  Service: Endoscopy;  Laterality: N/A;  [place]  2005   Dr. Irving Shows   TOOTH EXTRACTION       Current Meds  Medication Sig   amLODipine (NORVASC) 5 MG tablet Take 1 tablet (5 mg total) by mouth daily.   ergocalciferol (VITAMIN D2) 1.25 MG (50000 UT) capsule Take 1 capsule (50,000 Units total) by mouth once a week. One capsule once weekly   metoprolol tartrate (LOPRESSOR) 25 MG tablet Take 1 tablet by mouth twice daily     Allergies:   Patient has no known allergies.   ROS:   Please see the history of present illness.     All other systems reviewed and are negative.   Labs/Other Tests and Data Reviewed:    Recent Labs: 05/17/2020: TSH 2.32 01/03/2021: ALT 27; BUN 10; Creatinine, Ser 1.23; Hemoglobin 14.0; Platelets 308; Potassium 3.8; Sodium 139   Recent Lipid Panel Lab Results  Component Value Date/Time   CHOL 176 01/03/2021 10:46 AM   TRIG 163 (H) 01/03/2021 10:46 AM   HDL 66 01/03/2021 10:46 AM   CHOLHDL 2.6 05/17/2020 02:44 PM   LDLCALC 82 01/03/2021 10:46 AM   LDLCALC 68 05/17/2020 02:44 PM    Wt Readings from Last 3 Encounters:  01/29/21 150  lb (68 kg)  01/11/21 149 lb 6.4 oz (67.8 kg)  11/21/20 148 lb (67.1 kg)     ASSESSMENT & PLAN:    Suspected COVID-19 infection URTI Advised to get COVID RT-PCR Self-quarantine for now Robitussin or Mucinex PRN If negative COVID test with persistent symptoms, will start antibiotics  Time:   Today, I have spent 8 minutes reviewing the chart, including problem list, medications, and with the patient with telehealth technology discussing the above problems.   Medication Adjustments/Labs and Tests Ordered: Current medicines are reviewed at length with the patient today.  Concerns regarding medicines are  outlined above.   Tests Ordered: No orders of the defined types were placed in this encounter.   Medication Changes: No orders of the defined types were placed in this encounter.    Note: This dictation was prepared with Dragon dictation along with smaller phrase technology. Similar sounding words can be transcribed inadequately or may not be corrected upon review. Any transcriptional errors that result from this process are unintentional.      Disposition:  Follow up  Signed, Lindell Spar, MD  02/06/2021 1:48 PM     [facility]

## 2021-05-23 ENCOUNTER — Other Ambulatory Visit: Payer: Self-pay

## 2021-05-23 ENCOUNTER — Encounter: Payer: Self-pay | Admitting: Family Medicine

## 2021-05-23 ENCOUNTER — Ambulatory Visit (INDEPENDENT_AMBULATORY_CARE_PROVIDER_SITE_OTHER): Payer: Medicare Other | Admitting: Family Medicine

## 2021-05-23 VITALS — BP 125/82 | HR 59 | Resp 16 | Ht 64.0 in | Wt 149.0 lb

## 2021-05-23 DIAGNOSIS — E663 Overweight: Secondary | ICD-10-CM

## 2021-05-23 DIAGNOSIS — E559 Vitamin D deficiency, unspecified: Secondary | ICD-10-CM | POA: Diagnosis not present

## 2021-05-23 DIAGNOSIS — R7302 Impaired glucose tolerance (oral): Secondary | ICD-10-CM | POA: Diagnosis not present

## 2021-05-23 DIAGNOSIS — M25512 Pain in left shoulder: Secondary | ICD-10-CM | POA: Diagnosis not present

## 2021-05-23 DIAGNOSIS — Z720 Tobacco use: Secondary | ICD-10-CM | POA: Diagnosis not present

## 2021-05-23 DIAGNOSIS — I10 Essential (primary) hypertension: Secondary | ICD-10-CM | POA: Diagnosis not present

## 2021-05-23 DIAGNOSIS — Z125 Encounter for screening for malignant neoplasm of prostate: Secondary | ICD-10-CM

## 2021-05-23 DIAGNOSIS — G8929 Other chronic pain: Secondary | ICD-10-CM

## 2021-05-23 DIAGNOSIS — Z23 Encounter for immunization: Secondary | ICD-10-CM | POA: Diagnosis not present

## 2021-05-23 DIAGNOSIS — F10288 Alcohol dependence with other alcohol-induced disorder: Secondary | ICD-10-CM

## 2021-05-23 MED ORDER — AMLODIPINE BESYLATE 5 MG PO TABS: 5.0000 mg | ORAL_TABLET | Freq: Every day | ORAL | 1 refills | Status: DC

## 2021-05-23 MED ORDER — METOPROLOL TARTRATE 25 MG PO TABS: 25.0000 mg | ORAL_TABLET | Freq: Two times a day (BID) | ORAL | 1 refills | Status: DC

## 2021-05-23 NOTE — Assessment & Plan Note (Signed)
  Patient re-educated about  the importance of commitment to a  minimum of 150 minutes of exercise per week as able.  The importance of healthy food choices with portion control discussed, as well as eating regularly and within a 12 hour window most days. The need to choose "clean , green" food 50 to 75% of the time is discussed, as well as to make water the primary drink and set a goal of 64 ounces water daily.    Weight /BMI 05/23/2021 01/29/2021 01/11/2021  WEIGHT 149 lb 150 lb 149 lb 6.4 oz  HEIGHT 5\' 4"  5\' 1"  5' 4.5"  BMI 25.58 kg/m2 28.34 kg/m2 25.25 kg/m2

## 2021-05-23 NOTE — Assessment & Plan Note (Signed)
Updated lab needed at/ before next visit.   

## 2021-05-23 NOTE — Assessment & Plan Note (Signed)
States reducing intake and trying to [place], encouraged to do so, states has 2 drinks on avg 3 days per week

## 2021-05-23 NOTE — Assessment & Plan Note (Signed)
Asked:confirms currently dips snuff, states cutting back Assess: Unwilling to set a quit date, but is cutting back Advise: needs to QUIT to reduce risk of cancer, cardio and cerebrovascular disease Assist: counseled for 5 minutes and literature provided Arrange: follow up in 2 to 4 months

## 2021-05-23 NOTE — Assessment & Plan Note (Signed)
Controlled, no change in medication DASH diet and commitment to daily physical activity for a minimum of 30 minutes discussed and encouraged, as a part of hypertension management. The importance of attaining a healthy weight is also discussed.  BP/Weight 05/23/2021 01/29/2021 01/11/2021 11/21/2020 10/03/2020 07/04/2020 2/53/6644  Systolic BP 034 742 595 638 - 756 433  Diastolic BP 82 84 73 94 - 99 75  Wt. (Lbs) 149 150 149.4 148 150 143.2 143  BMI 25.58 28.34 25.25 25.01 28.34 25.37 25.33

## 2021-05-23 NOTE — Assessment & Plan Note (Signed)
Patient educated about the importance of limiting  Carbohydrate intake , the need to commit to daily physical activity for a minimum of 30 minutes , and to commit weight loss. The fact that changes in all these areas will reduce or eliminate all together the development of diabetes is stressed.   Diabetic Labs Latest Ref Rng & Units 01/03/2021 05/17/2020 10/13/2018 08/26/2018 01/15/2016  HbA1c 4.8 - 5.6 % 5.7(H) - 5.4 - -  Chol 100 - 199 mg/dL 176 156 183 - 115(L)  HDL >39 mg/dL 66 59 67 - 45  Calc LDL 0 - 99 mg/dL 82 68 90 - 23  Triglycerides 0 - 149 mg/dL 163(H) 234(H) 160(H) - 237(H)  Creatinine 0.76 - 1.27 mg/dL 1.23 1.03 - 0.84 1.04   BP/Weight 05/23/2021 01/29/2021 01/11/2021 11/21/2020 10/03/2020 07/04/2020 3/79/5583  Systolic BP 167 425 525 894 - 834 758  Diastolic BP 82 84 73 94 - 99 75  Wt. (Lbs) 149 150 149.4 148 150 143.2 143  BMI 25.58 28.34 25.25 25.01 28.34 25.37 25.33   No flowsheet data found.

## 2021-05-23 NOTE — Assessment & Plan Note (Signed)
Chronic and unchanged, has had therapy

## 2021-05-23 NOTE — Progress Notes (Signed)
Alexander Simmons     MRN: [id number]      DOB: 03-11-1956   HPI [name] is here for follow up and re-evaluation of chronic medical conditions, medication management and review of any available recent lab and radiology data.  Preventive health is updated, specifically  Cancer screening and Immunization.   Questions or concerns regarding consultations or procedures which the PT has had in the interim are  addressed. The PT denies any adverse reactions to current medications since the last visit.  Left shoulder  pain with reduced mobility since fell in 12/2019 when he got dizzy  ROS Denies recent fever or chills. Denies sinus pressure, nasal congestion, ear pain or sore throat. Denies chest congestion, productive cough or wheezing. Denies chest pains, palpitations and leg swelling Denies abdominal pain, nausea, vomiting,diarrhea or constipation.   Denies dysuria, frequency, hesitancy or incontinence. . Denies headaches, seizures, numbness, or tingling. Denies depression, anxiety or insomnia. Denies skin break down or rash.   PE  BP 125/82   Pulse (!) 59   Resp 16   Ht 5\' 4"  (1.626 m)   Wt 149 lb (67.6 kg)   SpO2 94%   BMI 25.58 kg/m   Patient alert and oriented and in no cardiopulmonary distress.  HEENT: No facial asymmetry, EOMI,     Neck supple .  Chest: Clear to auscultation bilaterally.  CVS: S1, S2 no murmurs, no S3.Regular rate.  ABD: Soft non tender.   Ext: No edema  MS: Adequate ROM spine, , hips and knees.decreased ROM left shoulder  Skin: Intact, no ulcerations or rash noted.  Psych: Good eye contact, normal affect.  not anxious or depressed appearing.  CNS: CN 2-12 intact, power,  normal throughout.no focal deficits noted.   Assessment & Plan  Hypertension, essential Controlled, no change in medication DASH diet and commitment to daily physical activity for a minimum of 30 minutes discussed and encouraged, as a part of hypertension  management. The importance of attaining a healthy weight is also discussed.  BP/Weight 05/23/2021 01/29/2021 01/11/2021 11/21/2020 10/03/2020 07/04/2020 08/27/5850  Systolic BP 778 242 353 614 - 431 540  Diastolic BP 82 84 73 94 - 99 75  Wt. (Lbs) 149 150 149.4 148 150 143.2 143  BMI 25.58 28.34 25.25 25.01 28.34 25.37 25.33       Vitamin D deficiency Updated lab needed at/ before next visit.   Overweight (BMI 25.0-29.9)  Patient re-educated about  the importance of commitment to a  minimum of 150 minutes of exercise per week as able.  The importance of healthy food choices with portion control discussed, as well as eating regularly and within a 12 hour window most days. The need to choose "clean , green" food 50 to 75% of the time is discussed, as well as to make water the primary drink and set a goal of 64 ounces water daily.    Weight /BMI 05/23/2021 01/29/2021 01/11/2021  WEIGHT 149 lb 150 lb 149 lb 6.4 oz  HEIGHT 5\' 4"  5\' 1"  5' 4.5"  BMI 25.58 kg/m2 28.34 kg/m2 25.25 kg/m2      IGT (impaired glucose tolerance) Patient educated about the importance of limiting  Carbohydrate intake , the need to commit to daily physical activity for a minimum of 30 minutes , and to commit weight loss. The fact that changes in all these areas will reduce or eliminate all together the development of diabetes is stressed.   Diabetic Labs Latest Ref Rng &  Units 01/03/2021 05/17/2020 10/13/2018 08/26/2018 01/15/2016  HbA1c 4.8 - 5.6 % 5.7(H) - 5.4 - -  Chol 100 - 199 mg/dL 176 156 183 - 115(L)  HDL >39 mg/dL 66 59 67 - 45  Calc LDL 0 - 99 mg/dL 82 68 90 - 23  Triglycerides 0 - 149 mg/dL 163(H) 234(H) 160(H) - 237(H)  Creatinine 0.76 - 1.27 mg/dL 1.23 1.03 - 0.84 1.04   BP/Weight 05/23/2021 01/29/2021 01/11/2021 11/21/2020 10/03/2020 07/04/2020 9/82/6415  Systolic BP 830 940 768 088 - 110 315  Diastolic BP 82 84 73 94 - 99 75  Wt. (Lbs) 149 150 149.4 148 150 143.2 143  BMI 25.58 28.34 25.25 25.01 28.34 25.37 25.33    No flowsheet data found.    Alcohol dependence ([place])  States reducing intake and trying to [place], encouraged to do so, states has 2 drinks on avg 3 days per week  Shoulder pain, left Chronic and unchanged, has had therapy   Tobacco abuse Asked:confirms currently dips snuff, states cutting back Assess: Unwilling to set a quit date, but is cutting back Advise: needs to QUIT to reduce risk of cancer, cardio and cerebrovascular disease Assist: counseled for 5 minutes and literature provided Arrange: follow up in 2 to 4 months

## 2021-05-23 NOTE — Patient Instructions (Addendum)
Annual exam in office with MD February 9 or after call if you need me sooner.  Flu vaccine in office today.  Nurse please obtain COVID vaccines from [facility] on scale strips and enter in patient's record.  You need to get your COVID booster please get this as soon as possible at the pharmacy.  You need to get your shingles vaccines please get these 2 at your pharmacy.  Continue to work on stopping snuff and reducing alcohol intake to quit and boost.  Labs today Chem-7 and EGFR,TSH ,.PSA vitamin D and HbA1c.  It is important that you exercise regularly at least 30 minutes 5 times a week. If you develop chest pain, have severe difficulty breathing, or feel very tired, stop exercising immediately and seek medical attention  Think about what you will eat, plan ahead. Choose " clean, green, fresh or frozen" over canned, processed or packaged foods which are more sugary, salty and fatty. 70 to 75% of food eaten should be vegetables and fruit. Three meals at set times with snacks allowed between meals, but they must be fruit or vegetables. Aim to eat over a 12 hour period , example 7 am to 7 pm, and STOP after  your last meal of the day. Drink water,generally about 64 ounces per day, no other drink is as healthy. Fruit juice is best enjoyed in a healthy way, by EATING the fruit. Thanks for choosing [facility], we consider it a privelige to serve you.

## 2021-05-24 LAB — BMP8+EGFR
BUN/Creatinine Ratio: 10 (ref 10–24)
BUN: 11 mg/dL (ref 8–27)
CO2: 22 mmol/L (ref 20–29)
Calcium: 10.4 mg/dL — ABNORMAL HIGH (ref 8.6–10.2)
Chloride: 99 mmol/L (ref 96–106)
Creatinine, Ser: 1.07 mg/dL (ref 0.76–1.27)
Glucose: 118 mg/dL — ABNORMAL HIGH (ref 70–99)
Potassium: 5 mmol/L (ref 3.5–5.2)
Sodium: 137 mmol/L (ref 134–144)
eGFR: 77 mL/min/{1.73_m2} (ref >59)

## 2021-05-24 LAB — TSH: TSH: 2.53 u[IU]/mL (ref 0.450–4.500)

## 2021-05-24 LAB — HEMOGLOBIN A1C
Est. average glucose Bld gHb Est-mCnc: 114 mg/dL
Hgb A1c MFr Bld: 5.6 % (ref 4.8–5.6)

## 2021-05-24 LAB — PSA: Prostate Specific Ag, Serum: 2.9 ng/mL (ref 0.0–4.0)

## 2021-05-24 LAB — VITAMIN D 25 HYDROXY (VIT D DEFICIENCY, FRACTURES): Vit D, 25-Hydroxy: 23.8 ng/mL — ABNORMAL LOW (ref 30.0–100.0)

## 2021-09-06 ENCOUNTER — Telehealth: Payer: Self-pay | Admitting: Family Medicine

## 2021-09-06 NOTE — Telephone Encounter (Signed)
Needs recommendation on Dentist, and or oral surgeon

## 2021-09-07 NOTE — Telephone Encounter (Signed)
Let him know we do not refer to dentistry. He can try any dentist that accepts his insurance. Please give him options in [place] ([facility] [phone] is one)

## 2021-09-10 NOTE — Telephone Encounter (Signed)
Advised pt, with understanding

## 2021-09-25 DIAGNOSIS — R69 Illness, unspecified: Secondary | ICD-10-CM | POA: Diagnosis not present

## 2021-10-08 ENCOUNTER — Other Ambulatory Visit: Payer: Self-pay

## 2021-10-08 ENCOUNTER — Ambulatory Visit (INDEPENDENT_AMBULATORY_CARE_PROVIDER_SITE_OTHER): Payer: 59

## 2021-10-08 DIAGNOSIS — Z0000 Encounter for general adult medical examination without abnormal findings: Secondary | ICD-10-CM | POA: Diagnosis not present

## 2021-10-08 NOTE — Patient Instructions (Signed)

## 2021-10-08 NOTE — Progress Notes (Signed)
I connected with  Alexander Simmons on 10/08/21 by a audio enabled telemedicine application and verified that I am speaking with the correct person using two identifiers.  Patient Location: Home  Provider Location: Home Office  I discussed the limitations of evaluation and management by telemedicine. The patient expressed understanding and agreed to proceed.  Subjective:   Alexander Simmons is a 66 y.o. male who presents for Medicare Annual/Subsequent preventive examination.  Review of Systems     Cardiac Risk Factors include: dyslipidemia;hypertension;smoking/ tobacco exposure;sedentary lifestyle;male gender     Objective:    There were no vitals filed for this visit. There is no height or weight on file to calculate BMI.  Advanced Directives 10/08/2021 10/03/2020 09/05/2020 01/15/2020 09/23/2018 08/26/2018 01/22/2017  Does Patient Have a Medical Advance Directive? No No No No No No No  Would patient like information on creating a medical advance directive? Yes (ED - Information included in AVS) No - Patient declined No - Patient declined - No - Patient declined No - Patient declined Yes (MAU/Ambulatory/Procedural Areas - Information given)  Pre-existing out of facility DNR order (yellow form or pink MOST form) - - - - - - -    Current Medications (verified) Outpatient Encounter Medications as of 10/08/2021  Medication Sig   amLODipine (NORVASC) 5 MG tablet Take 1 tablet (5 mg total) by mouth daily.   ergocalciferol (VITAMIN D2) 1.25 MG (50000 UT) capsule Take 1 capsule (50,000 Units total) by mouth once a week. One capsule once weekly   metoprolol tartrate (LOPRESSOR) 25 MG tablet Take 1 tablet (25 mg total) by mouth 2 (two) times daily.   No facility-administered encounter medications on file as of 10/08/2021.    Allergies (verified) Patient has no known allergies.   History: Past Medical History:  Diagnosis Date   Alcohol abuse    Allergy    Cor pulmonale (HCC)    By  echocardiography   Glaucoma    Hyperlipidemia    Hypertension    Small bowel obstruction ([place])    Syncope 12/2009   Attributed to use of narcotics plus alcohol   Tobacco abuse    Past Surgical History:  Procedure Laterality Date   ABDOMINAL SURGERY     COLONOSCOPY N/A 01/28/2013   Procedure: COLONOSCOPY;  Surgeon: Rogene Houston, MD;  Location: [facility];  Service: Endoscopy;  Laterality: N/A;  [place] EXCISION  2005   Dr. Irving Shows   TOOTH EXTRACTION     Family History  Problem Relation Age of Onset   Other Other        FH unknown-patient raised in foster care system   Colon cancer Neg Hx    Social History   Socioeconomic History   Marital status: Single    Spouse name: Not on file   Number of children: 1   Years of education: 54   Highest education level: 12th grade  Occupational History   Occupation: disabled   Tobacco Use   Smoking status: Never   Smokeless tobacco: Current    Types: Snuff, Chew   Tobacco comments:    chews tobacco since age 68.  Vaping Use   Vaping Use: Former  Substance and Sexual Activity   Alcohol use: Yes    Alcohol/week: 20.0 standard drinks    Types: 20 Shots of liquor per week    Comment: 1 pint of gin daily (4cups daily)   Drug use: No   Sexual activity:  Not Currently  Other Topics Concern   Not on file  Social History Narrative   Lives with his niece    Social Determinants of Health   Financial Resource Strain: Low Risk    Difficulty of Paying Living Expenses: Not very hard  Food Insecurity: No Food Insecurity   Worried About Charity fundraiser in the Last Year: Never true   Arboriculturist in the Last Year: Never true  Transportation Needs: No Transportation Needs   Lack of Transportation (Medical): No   Lack of Transportation (Non-Medical): No  Physical Activity: Insufficiently Active   Days of Exercise per Week: 3 days   Minutes of Exercise per Session: 20 min  Stress: Not on file  Social  Connections: Socially Isolated   Frequency of Communication with Friends and Family: More than three times a week   Frequency of Social Gatherings with Friends and Family: More than three times a week   Attends Religious Services: Never   Marine scientist or Organizations: No   Attends Archivist Meetings: Never   Marital Status: Never married    Tobacco Counseling Ready to quit: Not Answered Counseling given: Not Answered Tobacco comments: chews tobacco since age 25.   Clinical Intake:  Pre-visit preparation completed: Yes  Pain : No/denies pain     Nutritional Status: BMI 25 -29 Overweight Diabetes: No  How often do you need to have someone help you when you read instructions, pamphlets, or other written materials from your doctor or pharmacy?: 4 - Often (cannot read or write)  Diabetic?no         Activities of Daily Living In your present state of health, do you have any difficulty performing the following activities: 10/08/2021  Hearing? N  Vision? N  Difficulty concentrating or making decisions? N  Walking or climbing stairs? N  Dressing or bathing? N  Doing errands, shopping? N  Preparing Food and eating ? N  Using the Toilet? N  In the past six months, have you accidently leaked urine? N  Do you have problems with loss of bowel control? N  Managing your Medications? N  Managing your Finances? N  Housekeeping or managing your Housekeeping? N  Some recent data might be hidden    Patient Care Team: Fayrene Helper, MD as PCP - General (Family Medicine) Herminio Commons, MD (Inactive) as PCP - Cardiology (Cardiology) Lattie Haw Cristopher Estimable, MD (Inactive) as Attending Physician (Cardiology)  Indicate any recent Medical Services you may have received from other than [facility] providers in the past year (date may be approximate).     Assessment:   This is a routine wellness examination for Alexander Simmons.  Hearing/Vision screen No results  found.  Dietary issues and exercise activities discussed: Current Exercise Habits: The patient does not participate in regular exercise at present (works around the house and walks the dog daily)   Goals Addressed   None    Depression Screen PHQ 2/9 Scores 10/08/2021 05/23/2021 02/06/2021 01/11/2021 11/21/2020 10/03/2020 07/04/2020  PHQ - 2 Score 0 0 0 0 0 0 2  PHQ- 9 Score - - - - - - -    Fall Risk Fall Risk  10/08/2021 05/23/2021 05/23/2021 02/06/2021 01/11/2021  Falls in the past year? 0 0 1 0 1  Number falls in past yr: 0 - 0 0 0  Injury with Fall? 0 - 0 0 1  Risk for fall due to : - - - No Fall Risks -  Follow up - - - Falls evaluation completed -    FALL RISK PREVENTION PERTAINING TO THE HOME:  Any stairs in or around the home? Yes  If so, are there any without handrails? No  Home free of loose throw rugs in walkways, pet beds, electrical cords, etc? Yes  Adequate lighting in your home to reduce risk of falls? Yes   ASSISTIVE DEVICES UTILIZED TO PREVENT FALLS:  Life alert? No  Use of a cane, walker or w/c? No  Grab bars in the bathroom? No  Shower chair or bench in shower? No  Elevated toilet seat or a handicapped toilet? No   TIMED UP AND GO:     Cognitive Function:     6CIT Screen 10/08/2021 10/03/2020 10/01/2019 09/23/2018 01/22/2017  What Year? 0 points 0 points 0 points 0 points 0 points  What month? 0 points 0 points 0 points 0 points 0 points  What time? 0 points 0 points 0 points 0 points 0 points  Count back from 20 - - 0 points 0 points 0 points  Months in reverse 4 points - 4 points 4 points 0 points  Repeat phrase 2 points - 2 points 0 points 0 points  Total Score - - 6 4 0    Immunizations Immunization History  Administered Date(s) Administered   Fluad Quad(high Dose 65+) 05/11/2020, 05/23/2021   Influenza Whole 08/19/2007, 06/05/2010   Influenza,inj,Quad PF,6+ Mos 06/01/2014, 06/27/2016, 09/23/2018, 05/10/2019   Pneumococcal Conjugate-13 04/06/2014,  05/11/2020   Td 02/08/2004   Tdap 06/27/2016    TDAP status: Up to date  Flu Vaccine status: Up to date  Pneumococcal vaccine status: Due, Education has been provided regarding the importance of this vaccine. Advised may receive this vaccine at local pharmacy or Health Dept. Aware to provide a copy of the vaccination record if obtained from local pharmacy or Health Dept. Verbalized acceptance and understanding.  Covid-19 vaccine status: Completed vaccines  Qualifies for Shingles Vaccine? Yes   Zostavax completed No   Shingrix Completed?: No.    Education has been provided regarding the importance of this vaccine. Patient has been advised to call insurance company to determine out of pocket expense if they have not yet received this vaccine. Advised may also receive vaccine at local pharmacy or Health Dept. Verbalized acceptance and understanding.  Screening Tests Health Maintenance  Topic Date Due   Zoster Vaccines- Shingrix (1 of 2) Never done   Pneumonia Vaccine 67+ Years old (2 - PPSV23 if available, else PCV20) 05/11/2021   COVID-19 Vaccine (1) 06/08/2022 (Originally 03/04/1956)   COLONOSCOPY (Pts 45-41yrs Insurance coverage will need to be confirmed)  01/29/2023   TETANUS/TDAP  06/27/2026   INFLUENZA VACCINE  Completed   Hepatitis C Screening  Completed   HPV VACCINES  Aged Out    Health Maintenance  Health Maintenance Due  Topic Date Due   Zoster Vaccines- Shingrix (1 of 2) Never done   Pneumonia Vaccine 46+ Years old (2 - PPSV23 if available, else PCV20) 05/11/2021    Colorectal cancer screening: Type of screening: Colonoscopy. Completed 2014. Repeat every 10 years  Lung Cancer Screening: (Low Dose CT Chest recommended if Age 104-80 years, 30 pack-year currently smoking OR have quit w/in 15years.) does not qualify.   Lung Cancer Screening Referral: na  Additional Screening:  Hepatitis C Screening: does not qualify; Completed   Vision Screening: Recommended annual  ophthalmology exams for early detection of glaucoma and other disorders of the eye. Is the patient  up to date with their annual eye exam?  Yes  Who is the provider or what is the name of the office in which the patient attends annual eye exams? Does not know the name  If pt is not established with a provider, would they like to be referred to a provider to establish care? No .   Dental Screening: Recommended annual dental exams for proper oral hygiene  Community Resource Referral / Chronic Care Management: CRR required this visit?  No   CCM required this visit?  No      Plan:     I have personally reviewed and noted the following in the patients chart:   Medical and social history Use of alcohol, tobacco or illicit drugs  Current medications and supplements including opioid prescriptions. Patient is not currently taking opioid prescriptions. Functional ability and status Nutritional status Physical activity Advanced directives List of other physicians Hospitalizations, surgeries, and ER visits in previous 12 months Vitals Screenings to include cognitive, depression, and falls Referrals and appointments  In addition, I have reviewed and discussed with patient certain preventive protocols, quality metrics, and best practice recommendations. A written personalized care plan for preventive services as well as general preventive health recommendations were provided to patient.     Kate Sable, LPN, LPN   01/10/6159   Nurse Notes:  Mr. Westmoreland , Thank you for taking time to come for your Medicare Wellness Visit. I appreciate your ongoing commitment to your health goals. Please review the following plan we discussed and let me know if I can assist you in the future.   These are the goals we discussed:  Goals      Quit using tobacco        This is a list of the screening recommended for you and due dates:  Health Maintenance  Topic Date Due   Zoster (Shingles) Vaccine (1 of  2) Never done   Pneumonia Vaccine (2 - PPSV23 if available, else PCV20) 05/11/2021   COVID-19 Vaccine (1) 06/08/2022*   Colon Cancer Screening  01/29/2023   Tetanus Vaccine  06/27/2026   Flu Shot  Completed   Hepatitis C Screening: USPSTF Recommendation to screen - Ages 18-79 yo.  Completed   HPV Vaccine  Aged Out  *Topic was postponed. The date shown is not the original due date.

## 2021-10-10 ENCOUNTER — Other Ambulatory Visit: Payer: Self-pay

## 2021-10-10 ENCOUNTER — Encounter: Payer: Self-pay | Admitting: Family Medicine

## 2021-10-10 ENCOUNTER — Telehealth: Payer: Self-pay | Admitting: Family Medicine

## 2021-10-10 ENCOUNTER — Ambulatory Visit (INDEPENDENT_AMBULATORY_CARE_PROVIDER_SITE_OTHER): Payer: 59 | Admitting: Family Medicine

## 2021-10-10 VITALS — BP 130/82 | HR 91 | Ht 61.0 in | Wt 151.0 lb

## 2021-10-10 DIAGNOSIS — F17210 Nicotine dependence, cigarettes, uncomplicated: Secondary | ICD-10-CM | POA: Diagnosis not present

## 2021-10-10 DIAGNOSIS — Z720 Tobacco use: Secondary | ICD-10-CM

## 2021-10-10 DIAGNOSIS — E785 Hyperlipidemia, unspecified: Secondary | ICD-10-CM | POA: Diagnosis not present

## 2021-10-10 DIAGNOSIS — Z23 Encounter for immunization: Secondary | ICD-10-CM | POA: Diagnosis not present

## 2021-10-10 DIAGNOSIS — I10 Essential (primary) hypertension: Secondary | ICD-10-CM

## 2021-10-10 DIAGNOSIS — R002 Palpitations: Secondary | ICD-10-CM

## 2021-10-10 DIAGNOSIS — E559 Vitamin D deficiency, unspecified: Secondary | ICD-10-CM | POA: Diagnosis not present

## 2021-10-10 DIAGNOSIS — E663 Overweight: Secondary | ICD-10-CM

## 2021-10-10 DIAGNOSIS — F10288 Alcohol dependence with other alcohol-induced disorder: Secondary | ICD-10-CM

## 2021-10-10 NOTE — Assessment & Plan Note (Signed)
Reports he has stopped alcohol in 09/2021

## 2021-10-10 NOTE — Progress Notes (Signed)
° °  Alexander Simmons     MRN: [id number]      DOB: 03-Aug-1956   HPI Alexander Simmons is here for follow up and re-evaluation of chronic medical conditions, medication management and review of any available recent lab and radiology data.  Preventive health is updated, specifically  Cancer screening and Immunization.   Has upcoming dental extractions planned, a lot of caries, currently on antibiotic. The PT denies any adverse reactions to current medications since the last visit.  States he has stopped drinking, still dips snuff  ROS Denies recent fever or chills. Denies sinus pressure, nasal congestion, ear pain or sore throat. Denies chest congestion, productive cough or wheezing. Denies chest pains, palpitations and leg swelling Denies abdominal pain, nausea, vomiting,diarrhea or constipation.   Denies dysuria, frequency, hesitancy or incontinence. Denies joint pain, swelling and limitation in mobility. Denies headaches, seizures, numbness, or tingling. Denies depression, anxiety or insomnia. Denies skin break down or rash.   PE  BP 130/82    Pulse 91    Ht 5\' 1"  (1.549 m)    Wt 151 lb 0.6 oz (68.5 kg)    SpO2 98%    BMI 28.54 kg/m   Patient alert and oriented and in no cardiopulmonary distress.  HEENT: No facial asymmetry, EOMI,     Neck supple .Poor dentiotion, multiple broken teeth  Chest: Clear to auscultation bilaterally.  CVS: S1, S2 no murmurs, no S3.Regular rate.  ABD: Soft non tender.   Ext: No edema  MS: Adequate ROM spine, shoulders, hips and knees.  Skin: Intact, no ulcerations or rash noted.  Psych: Good eye contact, normal affect. Memory intact not anxious or depressed appearing.  CNS: CN 2-12 intact, power,  normal throughout.no focal deficits noted.   Assessment & Plan  Hypertension, essential Controlled, no change in medication   DASH diet and commitment to daily physical activity for a minimum of 30 minutes discussed and encouraged, as a part of  hypertension management. The importance of attaining a healthy weight is also discussed.  BP/Weight 10/10/2021 05/23/2021 01/29/2021 01/11/2021 11/21/2020 10/03/2020 85/0/2774  Systolic BP 128 786 767 209 470 - 962  Diastolic BP 82 82 84 73 94 - 99  Wt. (Lbs) 151.04 149 150 149.4 148 150 143.2  BMI 28.54 25.58 28.34 25.25 25.01 28.34 25.37       Overweight (BMI 25.0-29.9)  Patient re-educated about  the importance of commitment to a  minimum of 150 minutes of exercise per week as able.  The importance of healthy food choices with portion control discussed, as well as eating regularly and within a 12 hour window most days. The need to choose "clean , green" food 50 to 75% of the time is discussed, as well as to make water the primary drink and set a goal of 64 ounces water daily.    Weight /BMI 10/10/2021 05/23/2021 01/29/2021  WEIGHT 151 lb 0.6 oz 149 lb 150 lb  HEIGHT 5\' 1"  5\' 4"  5\' 1"   BMI 28.54 kg/m2 25.58 kg/m2 28.34 kg/m2      Tobacco abuse Asked:confirms currently dips snuff Assess: Unwilling to set a quit date, but is cutting back Advise: needs to QUIT to reduce risk of cancer, cardio and cerebrovascular disease Assist: counseled for 5 minutes and literature provided Arrange: follow up in 2 to 4 months   Alcohol dependence ([place]) Reports he has stopped alcohol in 09/2021  Palpitations Controlled, no change in medication

## 2021-10-10 NOTE — Telephone Encounter (Signed)
Did not specify, just noticed some changes on AVS and wanted to discuss

## 2021-10-10 NOTE — Assessment & Plan Note (Signed)
Controlled, no change in medication  

## 2021-10-10 NOTE — Assessment & Plan Note (Signed)
Asked:confirms currently dips snuff Assess: Unwilling to set a quit date, but is cutting back Advise: needs to QUIT to reduce risk of cancer, cardio and cerebrovascular disease Assist: counseled for 5 minutes and literature provided Arrange: follow up in 2 to 4 months

## 2021-10-10 NOTE — Telephone Encounter (Signed)
Called in about visit 2/15   Pt has some med changes and wanted to discuss

## 2021-10-10 NOTE — Patient Instructions (Addendum)
Annual exam with MD in 1 to 3 months, call if you need me sooner  Good blood pressure  Good that you quit alcohol  Please work on stopping snuff   Lipid, cmp and eGFr and Vit D today  Pneumonia 20 today  Please get shingrix vaccines at pharmacy  All the best with dental work  It is important that you exercise regularly at least 30 minutes 5 times a week. If you develop chest pain, have severe difficulty breathing, or feel very tired, stop exercising immediately and seek medical attention    Thanks for choosing [facility], we consider it a privelige to serve you.

## 2021-10-10 NOTE — Assessment & Plan Note (Signed)
Controlled, no change in medication   DASH diet and commitment to daily physical activity for a minimum of 30 minutes discussed and encouraged, as a part of hypertension management. The importance of attaining a healthy weight is also discussed.  BP/Weight 10/10/2021 05/23/2021 01/29/2021 01/11/2021 11/21/2020 10/03/2020 83/12/8444  Systolic BP 520 761 915 502 714 - 232  Diastolic BP 82 82 84 73 94 - 99  Wt. (Lbs) 151.04 149 150 149.4 148 150 143.2  BMI 28.54 25.58 28.34 25.25 25.01 28.34 25.37

## 2021-10-10 NOTE — Telephone Encounter (Signed)
I called and had to leave a voicemail. Did he say what his question was? There were no med changes at his visit

## 2021-10-10 NOTE — Assessment & Plan Note (Signed)
°  Patient re-educated about  the importance of commitment to a  minimum of 150 minutes of exercise per week as able.  The importance of healthy food choices with portion control discussed, as well as eating regularly and within a 12 hour window most days. The need to choose "clean , green" food 50 to 75% of the time is discussed, as well as to make water the primary drink and set a goal of 64 ounces water daily.    Weight /BMI 10/10/2021 05/23/2021 01/29/2021  WEIGHT 151 lb 0.6 oz 149 lb 150 lb  HEIGHT 5\' 1"  5\' 4"  5\' 1"   BMI 28.54 kg/m2 25.58 kg/m2 28.34 kg/m2

## 2021-10-11 ENCOUNTER — Telehealth: Payer: Self-pay

## 2021-10-11 LAB — LIPID PANEL
Chol/HDL Ratio: 2.7 ratio (ref 0.0–5.0)
Cholesterol, Total: 170 mg/dL (ref 100–199)
HDL: 63 mg/dL (ref >39)
LDL Chol Calc (NIH): 82 mg/dL (ref 0–99)
Triglycerides: 145 mg/dL (ref 0–149)
VLDL Cholesterol Cal: 25 mg/dL (ref 5–40)

## 2021-10-11 LAB — VITAMIN D 25 HYDROXY (VIT D DEFICIENCY, FRACTURES): Vit D, 25-Hydroxy: 26.7 ng/mL — ABNORMAL LOW (ref 30.0–100.0)

## 2021-10-11 LAB — CMP14+EGFR
ALT: 15 IU/L (ref 0–44)
AST: 20 IU/L (ref 0–40)
Albumin/Globulin Ratio: 1.8 (ref 1.2–2.2)
Albumin: 4.4 g/dL (ref 3.8–4.8)
Alkaline Phosphatase: 81 IU/L (ref 44–121)
BUN/Creatinine Ratio: 9 — ABNORMAL LOW (ref 10–24)
BUN: 10 mg/dL (ref 8–27)
Bilirubin Total: 0.7 mg/dL (ref 0.0–1.2)
CO2: 24 mmol/L (ref 20–29)
Calcium: 9.5 mg/dL (ref 8.6–10.2)
Chloride: 102 mmol/L (ref 96–106)
Creatinine, Ser: 1.06 mg/dL (ref 0.76–1.27)
Globulin, Total: 2.5 g/dL (ref 1.5–4.5)
Glucose: 118 mg/dL — ABNORMAL HIGH (ref 70–99)
Potassium: 4.1 mmol/L (ref 3.5–5.2)
Sodium: 139 mmol/L (ref 134–144)
Total Protein: 6.9 g/dL (ref 6.0–8.5)
eGFR: 77 mL/min/{1.73_m2} (ref >59)

## 2021-10-11 NOTE — Telephone Encounter (Signed)
Called asked if nurse will give patient a call back about med changes.

## 2021-10-12 NOTE — Telephone Encounter (Signed)
No changes made, wife aware

## 2021-10-12 NOTE — Telephone Encounter (Signed)
There were no med changes- wife aware

## 2022-03-24 LAB — FECAL OCCULT BLOOD, GUAIAC: Fecal Occult Blood: NEGATIVE

## 2022-03-25 ENCOUNTER — Encounter (HOSPITAL_COMMUNITY): Payer: Self-pay

## 2022-03-25 ENCOUNTER — Emergency Department (HOSPITAL_COMMUNITY): Payer: 59

## 2022-03-25 ENCOUNTER — Encounter (HOSPITAL_COMMUNITY)
Admission: EM | Disposition: A | Discharge status: Rehabilitation Facility | Payer: Self-pay | Source: Home / Self Care | Attending: Neurology

## 2022-03-25 ENCOUNTER — Emergency Department (HOSPITAL_COMMUNITY): Payer: 59 | Admitting: Certified Registered Nurse Anesthetist

## 2022-03-25 ENCOUNTER — Inpatient Hospital Stay (HOSPITAL_COMMUNITY)
Admission: EM | Admit: 2022-03-25 | Discharge: 2022-03-28 | DRG: 061 | Disposition: A | Discharge status: Rehabilitation Facility | Payer: 59 | Attending: Neurology | Admitting: Neurology

## 2022-03-25 ENCOUNTER — Emergency Department (EMERGENCY_DEPARTMENT_HOSPITAL): Payer: 59 | Admitting: Certified Registered Nurse Anesthetist

## 2022-03-25 ENCOUNTER — Other Ambulatory Visit: Payer: Self-pay

## 2022-03-25 DIAGNOSIS — I4891 Unspecified atrial fibrillation: Secondary | ICD-10-CM

## 2022-03-25 DIAGNOSIS — F10929 Alcohol use, unspecified with intoxication, unspecified: Secondary | ICD-10-CM

## 2022-03-25 DIAGNOSIS — Y906 Blood alcohol level of 120-199 mg/100 ml: Secondary | ICD-10-CM | POA: Diagnosis present

## 2022-03-25 DIAGNOSIS — I63511 Cerebral infarction due to unspecified occlusion or stenosis of right middle cerebral artery: Secondary | ICD-10-CM | POA: Diagnosis not present

## 2022-03-25 DIAGNOSIS — E669 Obesity, unspecified: Secondary | ICD-10-CM | POA: Diagnosis present

## 2022-03-25 DIAGNOSIS — E559 Vitamin D deficiency, unspecified: Secondary | ICD-10-CM | POA: Diagnosis present

## 2022-03-25 DIAGNOSIS — F10229 Alcohol dependence with intoxication, unspecified: Secondary | ICD-10-CM | POA: Diagnosis present

## 2022-03-25 DIAGNOSIS — J95821 Acute postprocedural respiratory failure: Secondary | ICD-10-CM | POA: Diagnosis not present

## 2022-03-25 DIAGNOSIS — Z20822 Contact with and (suspected) exposure to covid-19: Secondary | ICD-10-CM | POA: Diagnosis present

## 2022-03-25 DIAGNOSIS — N179 Acute kidney failure, unspecified: Secondary | ICD-10-CM

## 2022-03-25 DIAGNOSIS — D72829 Elevated white blood cell count, unspecified: Secondary | ICD-10-CM | POA: Diagnosis present

## 2022-03-25 DIAGNOSIS — G8194 Hemiplegia, unspecified affecting left nondominant side: Secondary | ICD-10-CM | POA: Diagnosis present

## 2022-03-25 DIAGNOSIS — E538 Deficiency of other specified B group vitamins: Secondary | ICD-10-CM | POA: Diagnosis present

## 2022-03-25 DIAGNOSIS — I6389 Other cerebral infarction: Secondary | ICD-10-CM | POA: Diagnosis not present

## 2022-03-25 DIAGNOSIS — R4182 Altered mental status, unspecified: Secondary | ICD-10-CM | POA: Diagnosis not present

## 2022-03-25 DIAGNOSIS — R296 Repeated falls: Secondary | ICD-10-CM | POA: Diagnosis present

## 2022-03-25 DIAGNOSIS — I6601 Occlusion and stenosis of right middle cerebral artery: Secondary | ICD-10-CM | POA: Diagnosis present

## 2022-03-25 DIAGNOSIS — I10 Essential (primary) hypertension: Secondary | ICD-10-CM | POA: Diagnosis not present

## 2022-03-25 DIAGNOSIS — I480 Paroxysmal atrial fibrillation: Secondary | ICD-10-CM | POA: Diagnosis not present

## 2022-03-25 DIAGNOSIS — W1830XA Fall on same level, unspecified, initial encounter: Secondary | ICD-10-CM | POA: Diagnosis present

## 2022-03-25 DIAGNOSIS — R471 Dysarthria and anarthria: Secondary | ICD-10-CM | POA: Diagnosis present

## 2022-03-25 DIAGNOSIS — I63411 Cerebral infarction due to embolism of right middle cerebral artery: Principal | ICD-10-CM | POA: Diagnosis present

## 2022-03-25 DIAGNOSIS — I509 Heart failure, unspecified: Secondary | ICD-10-CM | POA: Diagnosis present

## 2022-03-25 DIAGNOSIS — F1010 Alcohol abuse, uncomplicated: Secondary | ICD-10-CM | POA: Diagnosis not present

## 2022-03-25 DIAGNOSIS — I110 Hypertensive heart disease with heart failure: Secondary | ICD-10-CM | POA: Diagnosis present

## 2022-03-25 DIAGNOSIS — R29708 NIHSS score 8: Secondary | ICD-10-CM | POA: Diagnosis present

## 2022-03-25 DIAGNOSIS — I471 Supraventricular tachycardia: Secondary | ICD-10-CM | POA: Diagnosis present

## 2022-03-25 DIAGNOSIS — I426 Alcoholic cardiomyopathy: Secondary | ICD-10-CM | POA: Diagnosis present

## 2022-03-25 DIAGNOSIS — E875 Hyperkalemia: Secondary | ICD-10-CM | POA: Diagnosis not present

## 2022-03-25 DIAGNOSIS — R29810 Facial weakness: Secondary | ICD-10-CM | POA: Diagnosis present

## 2022-03-25 DIAGNOSIS — R4189 Other symptoms and signs involving cognitive functions and awareness: Secondary | ICD-10-CM | POA: Diagnosis present

## 2022-03-25 DIAGNOSIS — E785 Hyperlipidemia, unspecified: Secondary | ICD-10-CM | POA: Diagnosis present

## 2022-03-25 DIAGNOSIS — M25512 Pain in left shoulder: Secondary | ICD-10-CM | POA: Diagnosis present

## 2022-03-25 DIAGNOSIS — I639 Cerebral infarction, unspecified: Secondary | ICD-10-CM

## 2022-03-25 DIAGNOSIS — R41841 Cognitive communication deficit: Secondary | ICD-10-CM | POA: Diagnosis present

## 2022-03-25 DIAGNOSIS — E860 Dehydration: Secondary | ICD-10-CM | POA: Diagnosis present

## 2022-03-25 DIAGNOSIS — Y92019 Unspecified place in single-family (private) house as the place of occurrence of the external cause: Secondary | ICD-10-CM | POA: Diagnosis not present

## 2022-03-25 DIAGNOSIS — Z79899 Other long term (current) drug therapy: Secondary | ICD-10-CM

## 2022-03-25 DIAGNOSIS — E781 Pure hyperglyceridemia: Secondary | ICD-10-CM | POA: Diagnosis present

## 2022-03-25 DIAGNOSIS — F17210 Nicotine dependence, cigarettes, uncomplicated: Secondary | ICD-10-CM | POA: Diagnosis present

## 2022-03-25 DIAGNOSIS — S01512A Laceration without foreign body of oral cavity, initial encounter: Secondary | ICD-10-CM | POA: Diagnosis present

## 2022-03-25 DIAGNOSIS — Z9911 Dependence on respirator [ventilator] status: Secondary | ICD-10-CM

## 2022-03-25 DIAGNOSIS — Z6827 Body mass index (BMI) 27.0-27.9, adult: Secondary | ICD-10-CM

## 2022-03-25 DIAGNOSIS — F419 Anxiety disorder, unspecified: Secondary | ICD-10-CM | POA: Diagnosis present

## 2022-03-25 DIAGNOSIS — H409 Unspecified glaucoma: Secondary | ICD-10-CM | POA: Diagnosis present

## 2022-03-25 DIAGNOSIS — I69398 Other sequelae of cerebral infarction: Secondary | ICD-10-CM | POA: Diagnosis not present

## 2022-03-25 HISTORY — PX: RADIOLOGY WITH ANESTHESIA: SHX6223

## 2022-03-25 HISTORY — PX: IR ANGIO INTRA EXTRACRAN SEL COM CAROTID INNOMINATE BILAT MOD SED: IMG5360

## 2022-03-25 LAB — COMPREHENSIVE METABOLIC PANEL
ALT: 20 U/L (ref 0–44)
AST: 27 U/L (ref 15–41)
Albumin: 3.9 g/dL (ref 3.5–5.0)
Alkaline Phosphatase: 77 U/L (ref 38–126)
Anion gap: 11 (ref 5–15)
BUN: 15 mg/dL (ref 8–23)
CO2: 24 mmol/L (ref 22–32)
Calcium: 8.9 mg/dL (ref 8.9–10.3)
Chloride: 105 mmol/L (ref 98–111)
Creatinine, Ser: 1.27 mg/dL — ABNORMAL HIGH (ref 0.61–1.24)
GFR, Estimated: >60 mL/min (ref >60)
Glucose, Bld: 114 mg/dL — ABNORMAL HIGH (ref 70–99)
Potassium: 4 mmol/L (ref 3.5–5.1)
Sodium: 140 mmol/L (ref 135–145)
Total Bilirubin: 1.3 mg/dL — ABNORMAL HIGH (ref 0.3–1.2)
Total Protein: 7.3 g/dL (ref 6.5–8.1)

## 2022-03-25 LAB — CBC
HCT: 40.2 % (ref 39.0–52.0)
Hemoglobin: 13.5 g/dL (ref 13.0–17.0)
MCH: 34.4 pg — ABNORMAL HIGH (ref 26.0–34.0)
MCHC: 33.6 g/dL (ref 30.0–36.0)
MCV: 102.6 fL — ABNORMAL HIGH (ref 80.0–100.0)
Platelets: 295 10*3/uL (ref 150–400)
RBC: 3.92 MIL/uL — ABNORMAL LOW (ref 4.22–5.81)
RDW: 13.7 % (ref 11.5–15.5)
WBC: 3.3 10*3/uL — ABNORMAL LOW (ref 4.0–10.5)
nRBC: 0 % (ref 0.0–0.2)

## 2022-03-25 LAB — DIFFERENTIAL
Abs Immature Granulocytes: 0.01 10*3/uL (ref 0.00–0.07)
Basophils Absolute: 0.1 10*3/uL (ref 0.0–0.1)
Basophils Relative: 2 %
Eosinophils Absolute: 0.1 10*3/uL (ref 0.0–0.5)
Eosinophils Relative: 2 %
Immature Granulocytes: 0 %
Lymphocytes Relative: 36 %
Lymphs Abs: 1.2 10*3/uL (ref 0.7–4.0)
Monocytes Absolute: 0.4 10*3/uL (ref 0.1–1.0)
Monocytes Relative: 11 %
Neutro Abs: 1.6 10*3/uL — ABNORMAL LOW (ref 1.7–7.7)
Neutrophils Relative %: 49 %

## 2022-03-25 LAB — I-STAT CHEM 8, ED
BUN: 14 mg/dL (ref 8–23)
Calcium, Ion: 1.15 mmol/L (ref 1.15–1.40)
Chloride: 102 mmol/L (ref 98–111)
Creatinine, Ser: 1.5 mg/dL — ABNORMAL HIGH (ref 0.61–1.24)
Glucose, Bld: 112 mg/dL — ABNORMAL HIGH (ref 70–99)
HCT: 45 % (ref 39.0–52.0)
Hemoglobin: 15.3 g/dL (ref 13.0–17.0)
Potassium: 3.9 mmol/L (ref 3.5–5.1)
Sodium: 141 mmol/L (ref 135–145)
TCO2: 24 mmol/L (ref 22–32)

## 2022-03-25 LAB — PROTIME-INR
INR: 1.1 (ref 0.8–1.2)
Prothrombin Time: 14.3 seconds (ref 11.4–15.2)

## 2022-03-25 LAB — FOLATE: Folate: 2.8 ng/mL — ABNORMAL LOW (ref >5.9)

## 2022-03-25 LAB — VITAMIN B12: Vitamin B-12: 173 pg/mL — ABNORMAL LOW (ref 180–914)

## 2022-03-25 LAB — ETHANOL: Alcohol, Ethyl (B): 181 mg/dL — ABNORMAL HIGH (ref <10)

## 2022-03-25 LAB — CBG MONITORING, ED: Glucose-Capillary: 113 mg/dL — ABNORMAL HIGH (ref 70–99)

## 2022-03-25 LAB — RESP PANEL BY RT-PCR (FLU A&B, COVID) ARPGX2
Influenza A by PCR: NEGATIVE
Influenza B by PCR: NEGATIVE
SARS Coronavirus 2 by RT PCR: NEGATIVE

## 2022-03-25 LAB — APTT: aPTT: 21 seconds — ABNORMAL LOW (ref 24–36)

## 2022-03-25 SURGERY — IR WITH ANESTHESIA
Anesthesia: General

## 2022-03-25 MED ORDER — ACETAMINOPHEN 160 MG/5ML PO SOLN: 650.0000 mg | ORAL | Status: DC | PRN

## 2022-03-25 MED ORDER — TENECTEPLASE FOR STROKE
0.2500 mg/kg | PACK | Freq: Once | INTRAVENOUS | Status: AC
Start: 2022-03-25 — End: 2022-03-25

## 2022-03-25 MED ORDER — STROKE: EARLY STAGES OF RECOVERY BOOK
Freq: Once | Status: DC
  Filled 2022-03-25 (×2): qty 1

## 2022-03-25 MED ORDER — PROPOFOL 10 MG/ML IV BOLUS
INTRAVENOUS | Status: AC
  Filled 2022-03-25: qty 20

## 2022-03-25 MED ORDER — SUCCINYLCHOLINE CHLORIDE 200 MG/10ML IV SOSY
PREFILLED_SYRINGE | INTRAVENOUS | Status: DC | PRN
  Administered 2022-03-25: 100 mg via INTRAVENOUS

## 2022-03-25 MED ORDER — SODIUM CHLORIDE 0.9 % IV SOLN: INTRAVENOUS | Status: DC | PRN

## 2022-03-25 MED ORDER — ESMOLOL HCL 100 MG/10ML IV SOLN
INTRAVENOUS | Status: DC | PRN
  Administered 2022-03-25: 30 mg via INTRAVENOUS

## 2022-03-25 MED ORDER — SUGAMMADEX SODIUM 200 MG/2ML IV SOLN
INTRAVENOUS | Status: DC | PRN
  Administered 2022-03-25: 300 mg via INTRAVENOUS

## 2022-03-25 MED ORDER — PANTOPRAZOLE SODIUM 40 MG IV SOLR
40.0000 mg | Freq: Every day | INTRAVENOUS | Status: DC
  Administered 2022-03-26: 40 mg via INTRAVENOUS
  Filled 2022-03-25: qty 10

## 2022-03-25 MED ORDER — LABETALOL HCL 5 MG/ML IV SOLN
10.0000 mg | INTRAVENOUS | Status: DC | PRN
Start: 2022-03-25 — End: 2022-03-28
  Administered 2022-03-26: 10 mg via INTRAVENOUS
  Filled 2022-03-25 (×2): qty 4

## 2022-03-25 MED ORDER — ACETAMINOPHEN 650 MG RE SUPP: 650.0000 mg | RECTAL | Status: DC | PRN

## 2022-03-25 MED ORDER — PROPOFOL 10 MG/ML IV BOLUS
INTRAVENOUS | Status: DC | PRN
  Administered 2022-03-25: 130 mg via INTRAVENOUS

## 2022-03-25 MED ORDER — ACETAMINOPHEN 325 MG PO TABS: 650.0000 mg | ORAL_TABLET | ORAL | Status: DC | PRN

## 2022-03-25 MED ORDER — SODIUM CHLORIDE 0.9 % IV SOLN
INTRAVENOUS | Status: DC
Start: 2022-03-25 — End: 2022-03-26

## 2022-03-25 MED ORDER — THIAMINE HCL 100 MG/ML IJ SOLN: 100.0000 mg | Freq: Every day | INTRAMUSCULAR | Status: DC

## 2022-03-25 MED ORDER — PROPOFOL 1000 MG/100ML IV EMUL
5.0000 ug/kg/min | INTRAVENOUS | Status: DC
  Administered 2022-03-25: 104.478 ug/kg/min via INTRAVENOUS
  Administered 2022-03-26: 45 ug/kg/min via INTRAVENOUS
  Administered 2022-03-26: 60 ug/kg/min via INTRAVENOUS
  Filled 2022-03-25 (×4): qty 100

## 2022-03-25 MED ORDER — TENECTEPLASE FOR STROKE
PACK | INTRAVENOUS | Status: AC
  Administered 2022-03-25: 17 mg via INTRAVENOUS
  Filled 2022-03-25: qty 10

## 2022-03-25 MED ORDER — LIDOCAINE HCL (CARDIAC) PF 100 MG/5ML IV SOSY
PREFILLED_SYRINGE | INTRAVENOUS | Status: DC | PRN
  Administered 2022-03-25: 100 mg via INTRAVENOUS

## 2022-03-25 MED ORDER — SENNOSIDES-DOCUSATE SODIUM 8.6-50 MG PO TABS: 1.0000 | ORAL_TABLET | Freq: Every evening | ORAL | Status: DC | PRN

## 2022-03-25 MED ORDER — THIAMINE HCL 100 MG/ML IJ SOLN: 100.0000 mg | Freq: Once | INTRAMUSCULAR | Status: DC

## 2022-03-25 MED ORDER — ROCURONIUM BROMIDE 100 MG/10ML IV SOLN
INTRAVENOUS | Status: DC | PRN
  Administered 2022-03-25: 40 mg via INTRAVENOUS

## 2022-03-25 MED ORDER — PROPOFOL 500 MG/50ML IV EMUL
INTRAVENOUS | Status: DC | PRN
  Administered 2022-03-25: 75 ug/kg/min via INTRAVENOUS

## 2022-03-25 MED ORDER — THIAMINE HCL 100 MG PO TABS
100.0000 mg | ORAL_TABLET | Freq: Every day | ORAL | Status: DC
  Administered 2022-03-26 – 2022-03-28 (×3): 100 mg via ORAL
  Filled 2022-03-25 (×3): qty 1

## 2022-03-25 MED ORDER — IOHEXOL 350 MG/ML SOLN
100.0000 mL | Freq: Once | INTRAVENOUS | Status: AC | PRN
  Administered 2022-03-25: 100 mL via INTRAVENOUS

## 2022-03-25 MED ORDER — ONDANSETRON HCL 4 MG/2ML IJ SOLN
INTRAMUSCULAR | Status: DC | PRN
  Administered 2022-03-25: 4 mg via INTRAVENOUS

## 2022-03-25 MED ORDER — PHENYLEPHRINE HCL (PRESSORS) 10 MG/ML IV SOLN
INTRAVENOUS | Status: DC | PRN
  Administered 2022-03-25: 160 ug via INTRAVENOUS
  Administered 2022-03-25: 80 ug via INTRAVENOUS

## 2022-03-25 MED ORDER — ONDANSETRON HCL 4 MG/2ML IJ SOLN
4.0000 mg | Freq: Once | INTRAMUSCULAR | Status: AC
  Administered 2022-03-25: 4 mg via INTRAVENOUS
  Filled 2022-03-25: qty 2

## 2022-03-25 MED ORDER — DEXAMETHASONE SODIUM PHOSPHATE 10 MG/ML IJ SOLN
INTRAMUSCULAR | Status: DC | PRN
  Administered 2022-03-25: 5 mg via INTRAVENOUS

## 2022-03-25 NOTE — ED Notes (Signed)
Returned to room with patient, Neurologist on screen and states to take patient to MRI at this time.

## 2022-03-25 NOTE — Progress Notes (Signed)
Intubated, under the care of anesthesia

## 2022-03-25 NOTE — Consult Note (Addendum)
[facility] Telemedicine Consult  Requesting Provider: Milton Ferguson Consult Participants: Myself, bedside nurse Velna Hatchet, atrium nurse Kayla Location of the provider: [facility]  Location of the patient:   This consult was provided via telemedicine with 2-way video and audio communication. The patient/family was informed that care would be provided in this way and agreed to receive care in this manner.    Chief Complaint: Falls and left sided weakness   HPI: [name] is a 66 year old gentleman with a past medical history significant for hypertension, hyperlipidemia, obesity (BMI 27.89), alcohol abuse, chewing tobacco abuse, cognitive impairment.  He resides with Ms. Vinetta Bergamo a cousin who is his primary caregiver for the last 5 years.  History is obtained from her as well as the patient.  They note that at approximately 10 AM he had a fall while taking out the trash.  He was seen by the next-door neighbor and helped back into the house.  Ms. Vinetta Bergamo has some limited mobility at baseline requiring a walker herself and took some time to assess him, noting that his blood pressure was quite high (170/104).  However he did not have any weakness though he did have some emesis and slurred speech and she was concerned that he was drinking.  At 5 PM he had fallen again in between the couch and the coffee table and cried out, he was having additional emesis and at this time was noted to have some left-sided weakness which he has never had before.  Both the patient and the caregiver notes that the patient does not drink every day, although caregiver notes that few drinks anytime he has enough money to procure alcohol and she is unable to estimate his drinking because he often drinks surreptitiously.  He has been having increasing anxiety recently as well as complaining of headaches for the last 6 months.  She was also concerned he might have some left arm swelling this morning, but denies any other problems  on review of systems  LKW: 10 AM tpa given?: Yes 19:43, delay for MRI to confirm no matched FLAIR changes due to lack of clarity of symptom onset as well as delays due to effort required to ensure timely transfer for thrombectomy IR Thrombectomy? Yes Modified Rankin Scale: 2-Slight disability-UNABLE to perform all activities but does not need assistance (needs assistance with finances and caregiver checks he is taking his medications Time of teleneurologist evaluation: 6:22 PM  Exam: Vitals:   03/25/22 1810 03/25/22 1816  BP: (!) 160/85   Pulse: (!) 107   Resp: (!) 28   Temp:  98.1 F (36.7 C)  SpO2: 94%     General:  Pulmonary: breathing comfortably Cardiac: regular rate and rhythm on monitor   NIH Stroke scale 1A: Level of Consciousness - 0 1B: Ask Month and Age - 2, reported he was 66 and the month was June (he is 34 and the month is July) 1C: 'Blink Eyes' & 'Squeeze Hands' - 0 2: Test Horizontal Extraocular Movements - 0 3: Test Visual Fields - 0 4: Test Facial Palsy - 2 5A: Test Left Arm Motor Drift - 2 5B: Test Right Arm Motor Drift - 0 6A: Test Left Leg Motor Drift - 1 6B: Test Right Leg Motor Drift - 0 7: Test Limb Ataxia - 0 8: Test Sensation - 0 9: Test Language/Aphasia- 0 10: Test Dysarthria - 1 11: Test Extinction/Inattention - 0 NIHSS score: 8 (6 points for acute changes)   Imaging Reviewed:  Head CT  personally reviewed, agree with radiology that there is no clear acute intracranial process though there was some chronic microvascular changes that can hide acute processes CTA personally reviewed, there is an inferior division M2 occlusion which matches a perfusion deficit (CTP personally reviewed) of 52 cc which matches his left-sided deficits MRI brain personally reviewed, punctate subcortical diffusion restriction without matched FLAIR change in the territory at risk  Labs reviewed in epic and pertinent values follow: Ethanol level of 181 Glucose 113,  creatinine 1.27, platelets 295, PT/INR 14.3 and 1.1, PTT 21  Assessment: Acute right MCA inferior division stroke.  Suspect earlier falls may have been either stuttering symptoms secondary to subocclusive thrombus versus related to his chronic alcohol use and gait instability secondary to that.  TNK checklist was reviewed with patient as well as family who both confirmed he has no contraindications, that they understood the risks and agreed that these were reasonable risks in the setting of potential benefit.  He was treated by wake-up protocol given no matched FLAIR/diffusion changes.  Recommendations:   #Acute right MCA inferior division stroke -S/p TNK at 1943 per wake-up stroke criteria -Discussed with Dr. Estanislado Pandy and Dr. Lorrin Goodell as well as Dr. Roderic Palau -Emergent transfer to [facility] for thrombectomy -Will be admitted to neurology and neuro ICU and full stroke work-up   #Alcoholism -Thiamine level, B12 level, MMA, folate -CIWA protocol -Thiamine 100 mg IV ordered, but unclear if given prior to transfer  This patient is receiving care for possible acute neurological changes. There was 110 minutes of care by this provider at the time of service, including time for direct evaluation via telemedicine, review of medical records, imaging studies and discussion of findings with providers, the patient and/or family.  Lesleigh Noe MD-PhD [facility] [phone]  If 8pm-8am, please page neurology on call as listed in [place].

## 2022-03-25 NOTE — Transfer of Care (Signed)
Immediate Anesthesia Transfer of Care Note  Patient: Alexander Simmons  Procedure(s) Performed: IR WITH ANESTHESIA  Patient Location: PACU  Anesthesia Type:General  Level of Consciousness: Patient remains intubated per anesthesia plan  Airway & Oxygen Therapy: Patient remains intubated per anesthesia plan and Patient placed on Ventilator (see vital sign flow sheet for setting)  Post-op Assessment: Report given to RN, Post -op Vital signs reviewed and unstable, Anesthesiologist notified and Patient moving all extremities  Post vital signs: Reviewed and stable  Last Vitals:  Vitals Value Taken Time  BP 100/65 03/25/22 2137  Temp    Pulse 83 03/25/22 2145  Resp 17 03/25/22 2145  SpO2 98 % 03/25/22 2145  Vitals shown include unvalidated device data.  Last Pain:  Vitals:   03/25/22 1928  TempSrc: Oral  PainSc: 0-No pain         Complications: No notable events documented.

## 2022-03-25 NOTE — Procedures (Addendum)
INR. Bilateral common carotid arteriogram and right vertebral arteriogram. Right CFA approach. Findings 1.  No angiographic evidence of intracranial or extracranial filling defects or of occlusions, of dissections. 2.  Venous outflow flow within normal limits.Marland Kitchen  8 French Angio-Seal closure device used for hemostasis of the right groin puncture site.  Distal pulses all intact. Patient left intubated as per anesthesia.  Arlean Hopping MD

## 2022-03-25 NOTE — ED Notes (Signed)
1814 code stroke paged 1816 CT notified

## 2022-03-25 NOTE — Progress Notes (Addendum)
[facility]016 Neuro paged 1819 Dr. Curly Shores on screen [phone] and assessing pt/NIH Pt to [place] Pt back from CT at [facility] and immediately left for MRI at this time Code IR activated by Dr. Curly Shores at [place] Pt back from MRI at [facility] Pt out the door to [employer] at 2000

## 2022-03-25 NOTE — Progress Notes (Signed)
Report given to PACU RN and RT at bedside. Pt vitals stable at handoff, groin level 0 at hand off.

## 2022-03-25 NOTE — ED Notes (Signed)
MD back to room, patient OOB up ambulating with MD assessing patient in hall. Returned to bed and MD continues assessment. States at 1815 to initiate Code stroke.

## 2022-03-25 NOTE — Anesthesia Preprocedure Evaluation (Signed)
Anesthesia Evaluation  Patient identified by MRN, date of birth, ID band Patient awake    Reviewed: Allergy & Precautions, NPO status , Patient's Chart, lab work & pertinent test results, Unable to perform ROS - Chart review onlyPreop documentation limited or incomplete due to emergent nature of procedure.  Airway Mallampati: II  TM Distance: >3 FB Neck ROM: Full   Comment: Tongue laceration with blood Dental  (+) Dental Advisory Given, Poor Dentition, Chipped, Missing   Pulmonary  Dip tobacco   Pulmonary exam normal breath sounds clear to auscultation       Cardiovascular hypertension,  Rhythm:Regular Rate:Tachycardia     Neuro/Psych CVA, Residual Symptoms negative psych ROS   GI/Hepatic negative GI ROS, (+)     substance abuse  alcohol use,   Endo/Other  negative endocrine ROS  Renal/GU Renal disease (AKI)     Musculoskeletal negative musculoskeletal ROS (+)   Abdominal   Peds  Hematology negative hematology ROS (+)   Anesthesia Other Findings Day of surgery medications reviewed with the patient.  Reproductive/Obstetrics                             Anesthesia Physical Anesthesia Plan  ASA: 3 and emergent  Anesthesia Plan: General   Post-op Pain Management: Minimal or no pain anticipated   Induction: Intravenous  PONV Risk Score and Plan: 2 and Dexamethasone and Ondansetron  Airway Management Planned: Oral ETT  Additional Equipment: Arterial line  Intra-op Plan:   Post-operative Plan: Possible Post-op intubation/ventilation  Informed Consent:     History available from chart only and Only emergency history available  Plan Discussed with:   Anesthesia Plan Comments: (No consent prior to start of procedure. EMERGENCY CONSENT from Proceduralist.  )        Anesthesia Quick Evaluation

## 2022-03-25 NOTE — ED Notes (Signed)
Neurologist on screen and states that she would like to speak with patient before going to CT.

## 2022-03-25 NOTE — Anesthesia Procedure Notes (Signed)
Procedure Name: Intubation Date/Time: 03/25/2022 8:34 PM  Performed by: Wilna Pennie T, CRNAPre-anesthesia Checklist: Patient identified, Emergency Drugs available, Suction available and Patient being monitored Patient Re-evaluated:Patient Re-evaluated prior to induction Oxygen Delivery Method: Circle system utilized Preoxygenation: Pre-oxygenation with 100% oxygen Induction Type: IV induction Ventilation: Mask ventilation without difficulty Laryngoscope Size: McGraph and 3 Grade View: Grade I Tube type: Oral Tube size: 7.5 mm Number of attempts: 1 Airway Equipment and Method: Stylet, Oral airway and Video-laryngoscopy Placement Confirmation: ETT inserted through vocal cords under direct vision, positive ETCO2 and breath sounds checked- equal and bilateral Secured at: 22 cm Tube secured with: Tape Dental Injury: Teeth and Oropharynx as per pre-operative assessment and Bloody posterior oropharynx  Comments: Airway bloody 2nd to pt biting tongue PTA

## 2022-03-25 NOTE — ED Notes (Signed)
Patient remains in CT, perfusion in process.

## 2022-03-25 NOTE — ED Notes (Signed)
After assessing patient in room noted with left facial droop, left arm weakness. MD called to room. Asks to watch patient ambulate. MD out of room for phone call and states that he will return to complete assessment.

## 2022-03-25 NOTE — ED Provider Notes (Signed)
[facility] EMERGENCY DEPARTMENT Provider Note   CSN: [id number] Arrival date & time: 03/25/22  1758  An emergency department physician performed an initial assessment on this suspected stroke patient at 1815.  History {Add pertinent medical, surgical, social history, OB history to HPI:1} Chief Complaint  Patient presents with   Alexander Simmons is a 66 y.o. male.  Patient fell today and has left facial droop and weakness in his left arm   Fall       Home Medications Prior to Admission medications   Medication Sig Start Date End Date Taking? Authorizing Provider  amLODipine (NORVASC) 5 MG tablet Take 1 tablet (5 mg total) by mouth daily. 05/23/21   Fayrene Helper, MD  amoxicillin (AMOXIL) 500 MG capsule Take 500 mg by mouth 3 (three) times daily. 09/25/21   [provider]  ibuprofen (ADVIL) 800 MG tablet Take 800 mg by mouth every 8 (eight) hours as needed. 09/25/21   [provider]  metoprolol tartrate (LOPRESSOR) 25 MG tablet Take 1 tablet (25 mg total) by mouth 2 (two) times daily. 05/23/21   Fayrene Helper, MD      Allergies    Patient has no known allergies.    Review of Systems   Review of Systems  Physical Exam Updated Vital Signs BP (!) 142/103   Pulse 91   Temp 98.1 F (36.7 C) (Oral)   Resp (!) 29   Ht '5\' 1"'$  (1.549 m)   Wt 67 kg   SpO2 96%   BMI 27.89 kg/m  Physical Exam  ED Results / Procedures / Treatments   Labs (all labs ordered are listed, but only abnormal results are displayed) Labs Reviewed  ETHANOL - Abnormal; Notable for the following components:      Result Value   Alcohol, Ethyl (B) 181 (*)    All other components within normal limits  APTT - Abnormal; Notable for the following components:   aPTT 21 (*)    All other components within normal limits  CBC - Abnormal; Notable for the following components:   WBC 3.3 (*)    RBC 3.92 (*)    MCV 102.6 (*)    MCH 34.4 (*)    All other components within  normal limits  DIFFERENTIAL - Abnormal; Notable for the following components:   Neutro Abs 1.6 (*)    All other components within normal limits  COMPREHENSIVE METABOLIC PANEL - Abnormal; Notable for the following components:   Glucose, Bld 114 (*)    Creatinine, Ser 1.27 (*)    Total Bilirubin 1.3 (*)    All other components within normal limits  CBG MONITORING, ED - Abnormal; Notable for the following components:   Glucose-Capillary 113 (*)    All other components within normal limits  I-STAT CHEM 8, ED - Abnormal; Notable for the following components:   Creatinine, Ser 1.50 (*)    Glucose, Bld 112 (*)    All other components within normal limits  RESP PANEL BY RT-PCR (FLU A&B, COVID) ARPGX2  PROTIME-INR  RAPID URINE DRUG SCREEN, HOSP PERFORMED  URINALYSIS, ROUTINE W REFLEX MICROSCOPIC    EKG None  Radiology CT ANGIO HEAD NECK W WO CM W PERF (CODE STROKE)  Result Date: 03/25/2022 CLINICAL DATA:  Stroke suspected EXAM: CT ANGIOGRAPHY HEAD AND NECK CT PERFUSION BRAIN TECHNIQUE: Multidetector CT imaging of the head and neck was performed using the standard protocol during bolus administration of intravenous contrast. Multiplanar CT image  reconstructions and MIPs were obtained to evaluate the vascular anatomy. Carotid stenosis measurements (when applicable) are obtained utilizing NASCET criteria, using the distal internal carotid diameter as the denominator. Multiphase CT imaging of the brain was performed following IV bolus contrast injection. Subsequent parametric perfusion maps were calculated using RAPID software. RADIATION DOSE REDUCTION: This exam was performed according to the departmental dose-optimization program which includes automated exposure control, adjustment of the mA and/or kV according to patient size and/or use of iterative reconstruction technique. CONTRAST:  138m OMNIPAQUE IOHEXOL 350 MG/ML SOLN COMPARISON:  CT head 03/25/2022 no prior CTA FINDINGS: CT HEAD FINDINGS  For noncontrast findings, please see same day CT head. CTA NECK FINDINGS Aortic arch: Standard branching. Imaged portion shows no evidence of aneurysm or dissection. No significant stenosis of the major arch vessel origins. Right carotid system: No evidence of dissection, occlusion, or hemodynamically significant stenosis (greater than 50%). Left carotid system: No evidence of dissection, occlusion, or hemodynamically significant stenosis (greater than 50%). Vertebral arteries: Left dominant. No evidence of dissection, occlusion, or hemodynamically significant stenosis (greater than 50%). Skeleton: No acute osseous abnormality. Other neck: None. Upper chest: No focal pulmonary opacity or pleural effusion. Review of the MIP images confirms the above findings CTA HEAD FINDINGS Anterior circulation: Both internal carotid arteries are patent to the termini, without significant stenosis. A1 segments patent. Normal anterior communicating artery. Anterior cerebral arteries are patent to their distal aspects. No M1 stenosis or occlusion. Short-segment of a right M2 branch (series 6, images 1 100-101) with distal reconstitution (series 6, image 95), although the remainder of the vessel is somewhat poorly opacified (series 6, image 91). MCA branches otherwise well perfused and symmetric. Posterior circulation: Vertebral arteries patent to the vertebrobasilar junction without stenosis. Posterior inferior cerebellar arteries patent proximally. Basilar patent to its distal aspect. Superior cerebellar arteries patent proximally. Patent P1 segments. PCAs perfused to their distal aspects without stenosis. The bilateral posterior communicating arteries are patent. Venous sinuses: As permitted by contrast timing, patent. Anatomic variants: None significant. Review of the MIP images confirms the above findings CT Brain Perfusion Findings: ASPECTS: 10 CBF (<30%) Volume: 012mPerfusion (Tmax>6.0s) volume: 5372mismatch Volume: 30m68mnfarction Location:No infarct core, penumbra in the posterior left MCA territory IMPRESSION: 1. Occlusion of a right M2 branch, with subsequent reconstitution, although downstream opacification is diminished. 2. No infarct core. Area of decreased perfusion (penumbra) in the posterior right MCA territory measures 53 mL. 3. No other intracranial large vessel occlusion or significant stenosis. 4. No hemodynamically significant stenosis in the neck. These findings were discussed by telephone on 03/25/2022 at 7:00 pm with provider SRISHTI BHAGAT . Electronically Signed   By: AlisMerilyn Baba.   On: 03/25/2022 19:08   CT Cervical Spine Wo Contrast  Result Date: 03/25/2022 CLINICAL DATA:  Fall with neck pain EXAM: CT CERVICAL SPINE WITHOUT CONTRAST TECHNIQUE: Multidetector CT imaging of the cervical spine was performed without intravenous contrast. Multiplanar CT image reconstructions were also generated. RADIATION DOSE REDUCTION: This exam was performed according to the departmental dose-optimization program which includes automated exposure control, adjustment of the mA and/or kV according to patient size and/or use of iterative reconstruction technique. COMPARISON:  None Available. FINDINGS: Alignment: Straightening of the cervical spine. No subluxation. Facet alignment within normal limits Skull base and vertebrae: No acute fracture. No primary bone lesion or focal pathologic process. Soft tissues and spinal canal: No prevertebral fluid or swelling. No visible canal hematoma. Disc levels: Mild multilevel degenerative change. Facet degenerative  changes at multiple levels Upper chest: Negative. Other: None IMPRESSION: Straightening of the cervical spine.  No acute osseous abnormality Electronically Signed   By: Donavan Foil M.D.   On: 03/25/2022 18:50   CT HEAD CODE STROKE WO CONTRAST  Result Date: 03/25/2022 CLINICAL DATA:  Code stroke. EXAM: CT HEAD WITHOUT CONTRAST TECHNIQUE: Contiguous axial images were  obtained from the base of the skull through the vertex without intravenous contrast. RADIATION DOSE REDUCTION: This exam was performed according to the departmental dose-optimization program which includes automated exposure control, adjustment of the mA and/or kV according to patient size and/or use of iterative reconstruction technique. COMPARISON:  01/09/2010 FINDINGS: Brain: No evidence of acute infarction, hemorrhage, cerebral edema, mass, mass effect, or midline shift. No hydrocephalus or extra-axial collection. Vascular: No hyperdense vessel. Skull: Negative for fracture or focal lesion. Sinuses/Orbits: No acute finding. Other: The mastoid air cells are well aerated. ASPECTS [facility] Stroke Program Early CT Score) - Ganglionic level infarction (caudate, lentiform nuclei, internal capsule, insula, M1-M3 cortex): 7 - Supraganglionic infarction (M4-M6 cortex): 3 Total score (0-10 with 10 being normal): 10 IMPRESSION: 1. No acute intracranial process. 2. ASPECTS is 10 Code stroke imaging results were communicated on 03/25/2022 at 6:45 pm to provider Vicktoria Muckey via telephone, who verbally acknowledged these results. Electronically Signed   By: Merilyn Baba M.D.   On: 03/25/2022 18:49    Procedures Procedures  {Document cardiac monitor, telemetry assessment procedure when appropriate:1}  Medications Ordered in ED Medications  ondansetron (ZOFRAN) injection 4 mg (has no administration in time range)  iohexol (OMNIPAQUE) 350 MG/ML injection 100 mL (100 mLs Intravenous Contrast Given 03/25/22 1843)    ED Course/ Medical Decision Making/ A&P Patient was seen by neurology and appears patient does have a stroke on MRI.  He is being transferred immediately to [facility], ED and then interventional radiology for stroke.  I spoke with Dr. Curly Shores neurology and she is going to contact [facility] Making Amount and/or Complexity of Data Reviewed Labs: ordered. Radiology:  ordered.  Risk Prescription drug management.   Patient with acute stroke.  He is being transferred to [facility] and will go to interventional radiology and neurology will take care of the patient  {Document critical care time when appropriate:1} {Document review of labs and clinical decision tools ie heart score, Chads2Vasc2 etc:1}  {Document your independent review of radiology images, and any outside records:1} {Document your discussion with family members, caretakers, and with consultants:1} {Document social determinants of health affecting pt's care:1} {Document your decision making why or why not admission, treatments were needed:1} Final Clinical Impression(s) / ED Diagnoses Final diagnoses:  None    Rx / DC Orders ED Discharge Orders     None

## 2022-03-25 NOTE — Progress Notes (Signed)
Call to Dr. Everardo Beals to discuss bp parameters. Dr. Gifford Shave came into unit moments later to assess pt. He changed the patients propofol rate to 125 mcg/kg/min on the IV pump. He is satisfied with how the patient appears at this time.

## 2022-03-25 NOTE — ED Triage Notes (Signed)
Patient to ED with complaints of fall this am around 1030. States that he hit head with +LOC. States after fall he noticed left facial droop and slurred speech. EMS reports that family on scene are poor historians. Patient has been vomiting since first fall. Second fall this afternoon around 1700.

## 2022-03-25 NOTE — Anesthesia Procedure Notes (Signed)
Arterial Line Insertion Start/End7/31/2023 8:40 PM, 03/25/2022 8:45 PM Performed by: Santa Lighter, MD, anesthesiologist  Patient location: Pre-op. Preanesthetic checklist: patient identified, IV checked, site marked, risks and benefits discussed, surgical consent, monitors and equipment checked, pre-op evaluation, timeout performed and anesthesia consent Lidocaine 1% used for infiltration Left, radial was placed Catheter size: 20 G Hand hygiene performed  and maximum sterile barriers used   Attempts: 1 Procedure performed without using ultrasound guided technique. Following insertion, dressing applied and Biopatch. Post procedure assessment: normal and unchanged  Post procedure complications: second provider assisted. Patient tolerated the procedure well with no immediate complications.

## 2022-03-25 NOTE — ED Notes (Addendum)
Patient to MRI at this time. VS monitoring equipment removed. Patient noted to be coughing excessively and appears overall weak. Assisted to MRI table x2 staff. Keys and belt removed. Placed on bed.

## 2022-03-26 ENCOUNTER — Inpatient Hospital Stay (HOSPITAL_COMMUNITY): Payer: 59

## 2022-03-26 ENCOUNTER — Encounter (HOSPITAL_COMMUNITY): Payer: Self-pay | Admitting: Radiology

## 2022-03-26 DIAGNOSIS — I63511 Cerebral infarction due to unspecified occlusion or stenosis of right middle cerebral artery: Secondary | ICD-10-CM

## 2022-03-26 DIAGNOSIS — R4182 Altered mental status, unspecified: Secondary | ICD-10-CM

## 2022-03-26 DIAGNOSIS — I6601 Occlusion and stenosis of right middle cerebral artery: Secondary | ICD-10-CM | POA: Diagnosis present

## 2022-03-26 DIAGNOSIS — I6389 Other cerebral infarction: Secondary | ICD-10-CM

## 2022-03-26 DIAGNOSIS — Z9911 Dependence on respirator [ventilator] status: Secondary | ICD-10-CM

## 2022-03-26 DIAGNOSIS — I63411 Cerebral infarction due to embolism of right middle cerebral artery: Secondary | ICD-10-CM | POA: Diagnosis not present

## 2022-03-26 DIAGNOSIS — N179 Acute kidney failure, unspecified: Secondary | ICD-10-CM

## 2022-03-26 DIAGNOSIS — I639 Cerebral infarction, unspecified: Secondary | ICD-10-CM

## 2022-03-26 DIAGNOSIS — I4891 Unspecified atrial fibrillation: Secondary | ICD-10-CM

## 2022-03-26 DIAGNOSIS — F10929 Alcohol use, unspecified with intoxication, unspecified: Secondary | ICD-10-CM

## 2022-03-26 DIAGNOSIS — I10 Essential (primary) hypertension: Secondary | ICD-10-CM

## 2022-03-26 LAB — URINALYSIS, ROUTINE W REFLEX MICROSCOPIC
Bacteria, UA: NONE SEEN
Bilirubin Urine: NEGATIVE
Glucose, UA: NEGATIVE mg/dL
Ketones, ur: 5 mg/dL — AB
Leukocytes,Ua: NEGATIVE
Nitrite: NEGATIVE
Protein, ur: NEGATIVE mg/dL
RBC / HPF: >50 RBC/hpf — ABNORMAL HIGH (ref 0–5)
Specific Gravity, Urine: 1.041 — ABNORMAL HIGH (ref 1.005–1.030)
pH: 5 (ref 5.0–8.0)

## 2022-03-26 LAB — CBC WITH DIFFERENTIAL/PLATELET
Abs Immature Granulocytes: 0.05 10*3/uL (ref 0.00–0.07)
Basophils Absolute: 0 10*3/uL (ref 0.0–0.1)
Basophils Relative: 0 %
Eosinophils Absolute: 0 10*3/uL (ref 0.0–0.5)
Eosinophils Relative: 0 %
HCT: 33.2 % — ABNORMAL LOW (ref 39.0–52.0)
Hemoglobin: 11.3 g/dL — ABNORMAL LOW (ref 13.0–17.0)
Immature Granulocytes: 0 %
Lymphocytes Relative: 3 %
Lymphs Abs: 0.4 10*3/uL — ABNORMAL LOW (ref 0.7–4.0)
MCH: 34.3 pg — ABNORMAL HIGH (ref 26.0–34.0)
MCHC: 34 g/dL (ref 30.0–36.0)
MCV: 100.9 fL — ABNORMAL HIGH (ref 80.0–100.0)
Monocytes Absolute: 0.5 10*3/uL (ref 0.1–1.0)
Monocytes Relative: 4 %
Neutro Abs: 12.1 10*3/uL — ABNORMAL HIGH (ref 1.7–7.7)
Neutrophils Relative %: 93 %
Platelets: 244 10*3/uL (ref 150–400)
RBC: 3.29 MIL/uL — ABNORMAL LOW (ref 4.22–5.81)
RDW: 13.5 % (ref 11.5–15.5)
WBC: 13.1 10*3/uL — ABNORMAL HIGH (ref 4.0–10.5)
nRBC: 0 % (ref 0.0–0.2)

## 2022-03-26 LAB — RAPID URINE DRUG SCREEN, HOSP PERFORMED
Amphetamines: NOT DETECTED
Barbiturates: NOT DETECTED
Benzodiazepines: NOT DETECTED
Cocaine: NOT DETECTED
Opiates: NOT DETECTED
Tetrahydrocannabinol: NOT DETECTED

## 2022-03-26 LAB — LIPID PANEL
Cholesterol: 145 mg/dL (ref 0–200)
HDL: 37 mg/dL — ABNORMAL LOW (ref >40)
LDL Cholesterol: UNDETERMINED mg/dL (ref 0–99)
Total CHOL/HDL Ratio: 3.9 RATIO
Triglycerides: 647 mg/dL — ABNORMAL HIGH (ref <150)
VLDL: UNDETERMINED mg/dL (ref 0–40)

## 2022-03-26 LAB — POCT I-STAT 7, (LYTES, BLD GAS, ICA,H+H)
Acid-base deficit: 5 mmol/L — ABNORMAL HIGH (ref 0.0–2.0)
Bicarbonate: 20 mmol/L (ref 20.0–28.0)
Calcium, Ion: 1.08 mmol/L — ABNORMAL LOW (ref 1.15–1.40)
HCT: 38 % — ABNORMAL LOW (ref 39.0–52.0)
Hemoglobin: 12.9 g/dL — ABNORMAL LOW (ref 13.0–17.0)
O2 Saturation: 99 %
Patient temperature: 98.9
Potassium: 3.6 mmol/L (ref 3.5–5.1)
Sodium: 138 mmol/L (ref 135–145)
TCO2: 21 mmol/L — ABNORMAL LOW (ref 22–32)
pCO2 arterial: 36.2 mmHg (ref 32–48)
pH, Arterial: 7.351 (ref 7.35–7.45)
pO2, Arterial: 159 mmHg — ABNORMAL HIGH (ref 83–108)

## 2022-03-26 LAB — HEMOGLOBIN A1C
Hgb A1c MFr Bld: 5.3 % (ref 4.8–5.6)
Mean Plasma Glucose: 105.41 mg/dL

## 2022-03-26 LAB — MRSA NEXT GEN BY PCR, NASAL: MRSA by PCR Next Gen: NOT DETECTED

## 2022-03-26 LAB — VITAMIN B12: Vitamin B-12: 170 pg/mL — ABNORMAL LOW (ref 180–914)

## 2022-03-26 LAB — BASIC METABOLIC PANEL
Anion gap: 11 (ref 5–15)
BUN: 14 mg/dL (ref 8–23)
CO2: 21 mmol/L — ABNORMAL LOW (ref 22–32)
Calcium: 8.1 mg/dL — ABNORMAL LOW (ref 8.9–10.3)
Chloride: 104 mmol/L (ref 98–111)
Creatinine, Ser: 1.36 mg/dL — ABNORMAL HIGH (ref 0.61–1.24)
GFR, Estimated: 57 mL/min — ABNORMAL LOW (ref >60)
Glucose, Bld: 118 mg/dL — ABNORMAL HIGH (ref 70–99)
Potassium: 4.2 mmol/L (ref 3.5–5.1)
Sodium: 136 mmol/L (ref 135–145)

## 2022-03-26 LAB — ECHOCARDIOGRAM COMPLETE
AR max vel: 3.03 cm2
AV Peak grad: 5.4 mmHg
Ao pk vel: 1.17 m/s
Height: 61 in
MV M vel: 4.5 m/s
MV Peak grad: 81 mmHg
S' Lateral: 3.9 cm
Weight: 2361.6 oz

## 2022-03-26 LAB — LDL CHOLESTEROL, DIRECT: Direct LDL: 21 mg/dL (ref 0–99)

## 2022-03-26 LAB — HIV ANTIBODY (ROUTINE TESTING W REFLEX): HIV Screen 4th Generation wRfx: NONREACTIVE

## 2022-03-26 LAB — FOLATE: Folate: 6.6 ng/mL (ref >5.9)

## 2022-03-26 MED ORDER — ACETAMINOPHEN 650 MG RE SUPP: 650.0000 mg | RECTAL | Status: DC | PRN

## 2022-03-26 MED ORDER — CLEVIDIPINE BUTYRATE 0.5 MG/ML IV EMUL
INTRAVENOUS | Status: AC
  Administered 2022-03-26: 2 mg/h via INTRAVENOUS
  Filled 2022-03-26: qty 100

## 2022-03-26 MED ORDER — ACETAMINOPHEN 325 MG PO TABS: 650.0000 mg | ORAL_TABLET | ORAL | Status: DC | PRN

## 2022-03-26 MED ORDER — CHLORHEXIDINE GLUCONATE CLOTH 2 % EX PADS
6.0000 | MEDICATED_PAD | Freq: Every day | CUTANEOUS | Status: DC
  Administered 2022-03-26: 6 via TOPICAL

## 2022-03-26 MED ORDER — ACETAMINOPHEN 160 MG/5ML PO SOLN: 650.0000 mg | ORAL | Status: DC | PRN

## 2022-03-26 MED ORDER — MIDAZOLAM HCL 2 MG/2ML IJ SOLN: 2.0000 mg | Freq: Once | INTRAMUSCULAR | Status: DC | PRN

## 2022-03-26 MED ORDER — ADULT MULTIVITAMIN W/MINERALS CH
1.0000 | ORAL_TABLET | Freq: Every day | ORAL | Status: DC
  Administered 2022-03-26 – 2022-03-28 (×3): 1 via ORAL
  Filled 2022-03-26 (×3): qty 1

## 2022-03-26 MED ORDER — LORAZEPAM 1 MG PO TABS
1.0000 mg | ORAL_TABLET | ORAL | Status: DC | PRN
  Administered 2022-03-26 (×2): 1 mg via ORAL
  Filled 2022-03-26 (×2): qty 1

## 2022-03-26 MED ORDER — SODIUM CHLORIDE 0.9 % IV SOLN: INTRAVENOUS | Status: DC

## 2022-03-26 MED ORDER — FENTANYL 2500MCG IN NS 250ML (10MCG/ML) PREMIX INFUSION
0.0000 ug/h | INTRAVENOUS | Status: DC
  Administered 2022-03-26: 25 ug/h via INTRAVENOUS

## 2022-03-26 MED ORDER — FOLIC ACID 1 MG PO TABS
1.0000 mg | ORAL_TABLET | Freq: Every day | ORAL | Status: DC
  Administered 2022-03-26 – 2022-03-28 (×3): 1 mg via ORAL
  Filled 2022-03-26 (×3): qty 1

## 2022-03-26 MED ORDER — ORAL CARE MOUTH RINSE: 15.0000 mL | OROMUCOSAL | Status: DC | PRN

## 2022-03-26 MED ORDER — ORAL CARE MOUTH RINSE
15.0000 mL | OROMUCOSAL | Status: DC
  Administered 2022-03-26 (×2): 15 mL via OROMUCOSAL

## 2022-03-26 MED ORDER — FENTANYL 2500MCG IN NS 250ML (10MCG/ML) PREMIX INFUSION
INTRAVENOUS | Status: AC
  Filled 2022-03-26: qty 250

## 2022-03-26 MED ORDER — CLEVIDIPINE BUTYRATE 0.5 MG/ML IV EMUL: 0.0000 mg/h | INTRAVENOUS | Status: DC

## 2022-03-26 MED ORDER — LORAZEPAM 2 MG/ML IJ SOLN: 1.0000 mg | INTRAMUSCULAR | Status: DC | PRN

## 2022-03-26 MED ORDER — ENOXAPARIN SODIUM 40 MG/0.4ML IJ SOSY
40.0000 mg | PREFILLED_SYRINGE | INTRAMUSCULAR | Status: DC
Start: 2022-03-26 — End: 2022-03-27
  Administered 2022-03-26: 40 mg via SUBCUTANEOUS
  Filled 2022-03-26: qty 0.4

## 2022-03-26 MED ORDER — ASPIRIN 81 MG PO CHEW
81.0000 mg | CHEWABLE_TABLET | Freq: Every day | ORAL | Status: DC
  Administered 2022-03-26 – 2022-03-27 (×2): 81 mg via ORAL
  Filled 2022-03-26 (×2): qty 1

## 2022-03-26 MED ORDER — CLOPIDOGREL BISULFATE 75 MG PO TABS
75.0000 mg | ORAL_TABLET | Freq: Every day | ORAL | Status: DC
  Administered 2022-03-26 – 2022-03-27 (×2): 75 mg via ORAL
  Filled 2022-03-26 (×2): qty 1

## 2022-03-26 NOTE — H&P (Signed)
NEUROLOGY CONSULTATION NOTE   Date of service: March 26, 2022 Patient Name: Alexander Simmons MRN:  [id number] DOB:  06/25/1956  _ _ _   _ __   _ __ _ _  __ __   _ __   __ _  History of Present Illness  Alexander Simmons is a 66 y.o. male with PMH significant for HTN, HLD, glaucoma, tobacco use, alcohol use who presents with fall and left sided weakness.  He took out trash at 1000AM and had a fall. He was noted to have some slurring of speech but was otherwise at his baseline. At 1700, fell at home between couch and coffee table. Was noted to have L sided weakness. He was brought in to [facility] ED and a stroke code was activated.  CTH w/o contrast negative for ICH, CTA with a R M2 occlusion. MRI Brain with subtle signs of early infarction affecting the right deep insula and a few foci of the white matter in the right corona radiata. She was given tnkase and case discussed with Neuro IR and transferred to [facility] for thrombectomy.  LKW: 1000 on 03/25/22. mRS: 2 tNKASE: given at 1943. Thrombectomy: transferred to [facility] for thrombectomy with no LVO on angiogram. NIHSS components Score: Comment  1a Level of Conscious 0'[]'$  1'[x]'$  2'[]'$  3'[]'$      1b LOC Questions 0'[]'$  1'[]'$  2'[x]'$     intubated  1c LOC Commands 0'[x]'$  1'[]'$  2'[]'$       2 Best Gaze 0'[x]'$  1'[]'$  2'[]'$       3 Visual 0'[x]'$  1'[]'$  2'[]'$  3'[]'$      4 Facial Palsy 0'[x]'$  1'[]'$  2'[]'$  3'[]'$      5a Motor Arm - left 0'[]'$  1'[x]'$  2'[]'$  3'[]'$  4'[]'$  UN'[]'$    5b Motor Arm - Right 0'[]'$  1'[x]'$  2'[]'$  3'[]'$  4'[]'$  UN'[]'$    6a Motor Leg - Left 0'[]'$  1'[]'$  2'[]'$  3'[x]'$  4'[]'$  UN'[]'$    6b Motor Leg - Right 0'[]'$  1'[]'$  2'[]'$  3'[x]'$  4'[]'$  UN'[]'$    7 Limb Ataxia 0'[x]'$  1'[]'$  2'[]'$  3'[]'$  UN'[]'$     8 Sensory 0'[x]'$  1'[]'$  2'[]'$  UN'[]'$      9 Best Language 0'[]'$  1'[]'$  2'[x]'$  3'[]'$      10 Dysarthria 0'[]'$  1'[]'$  2'[]'$  UN'[x]'$      11 Extinct. and Inattention 0'[x]'$  1'[]'$  2'[]'$       TOTAL: 13     ROS   Unable to obtain detailed ROS due to intubation and sedation.  Past History   Past Medical History:  Diagnosis Date   Alcohol abuse    Allergy    Cor pulmonale (HCC)    By  echocardiography   Glaucoma    Hyperlipidemia    Hypertension    Small bowel obstruction ([place])    Syncope 12/2009   Attributed to use of narcotics plus alcohol   Tobacco abuse    Past Surgical History:  Procedure Laterality Date   ABDOMINAL SURGERY     COLONOSCOPY N/A 01/28/2013   Procedure: COLONOSCOPY;  Surgeon: Rogene [place], MD;  Location: [facility] ENDO SUITE;  Service: Endoscopy;  Laterality: N/A;  [place] EXCISION  2005   Dr. Irving Shows   TOOTH EXTRACTION     Family History  Problem Relation Age of Onset   Other Other        FH unknown-patient raised in foster care system   Colon cancer Neg Hx    Social History   Socioeconomic History   Marital status: Single    Spouse name: Not on file   Number of  children: 1   Years of education: 12   Highest education level: 12th grade  Occupational History   Occupation: disabled   Tobacco Use   Smoking status: Never   Smokeless tobacco: Current    Types: Snuff, Chew   Tobacco comments:    chews tobacco since age 76.  Vaping Use   Vaping Use: Former  Substance and Sexual Activity   Alcohol use: Yes    Alcohol/week: 20.0 standard drinks of alcohol    Types: 20 Shots of liquor per week    Comment: 1 pint of gin daily (4cups daily)   Drug use: No   Sexual activity: Not Currently  Other Topics Concern   Not on file  Social History Narrative   Lives with his niece    Social Determinants of Health   Financial Resource Strain: Low Risk  (10/08/2021)   Overall Financial Resource Strain (CARDIA)    Difficulty of Paying Living Expenses: Not very hard  Food Insecurity: No Food Insecurity (10/08/2021)   Hunger Vital Sign    Worried About Running Out of Food in the Last Year: Never true    Ran Out of Food in the Last Year: Never true  Transportation Needs: No Transportation Needs (10/08/2021)   PRAPARE - Hydrologist (Medical): No    Lack of Transportation (Non-Medical): No  Physical  Activity: Insufficiently Active (10/08/2021)   Exercise Vital Sign    Days of Exercise per Week: 3 days    Minutes of Exercise per Session: 20 min  Stress: No Stress Concern Present (10/03/2020)   [place]    Feeling of Stress : Not at all  Social Connections: Socially Isolated (10/08/2021)   Social Connection and Isolation Panel [NHANES]    Frequency of Communication with Friends and Family: More than three times a week    Frequency of Social Gatherings with Friends and Family: More than three times a week    Attends Religious Services: Never    Marine scientist or Organizations: No    Attends Archivist Meetings: Never    Marital Status: Never married   No Known Allergies  Medications   Medications Prior to Admission  Medication Sig Dispense Refill Last Dose   amLODipine (NORVASC) 5 MG tablet Take 1 tablet (5 mg total) by mouth daily. 90 tablet 1    amoxicillin (AMOXIL) 500 MG capsule Take 500 mg by mouth 3 (three) times daily.      ibuprofen (ADVIL) 800 MG tablet Take 800 mg by mouth every 8 (eight) hours as needed.      metoprolol tartrate (LOPRESSOR) 25 MG tablet Take 1 tablet (25 mg total) by mouth 2 (two) times daily. 180 tablet 1      Vitals   Vitals:   03/25/22 2330 03/25/22 2345 03/26/22 0000 03/26/22 0030  BP: 127/76 118/78 114/78 124/80  Pulse: 88 89 92 88  Resp: (!) 21 (!) '23 16 18  '$ Temp:    98 F (36.7 C)  TempSrc:      SpO2: 99% 100% 100% 100%  Weight:      Height:         Body mass index is 27.89 kg/m.  Physical Exam   General: Laying comfortably in bed; appears distresed and coughing on the ETT. HENT: blood in mouth. Normal external appearance of ears and nose. Neck: Supple, no pain or tenderness  CV: No JVD. No peripheral edema.  Pulmonary: Symmetric Chest rise. Coughing and tachypneic on the ETT. Abdomen: Soft to touch, non-tender.  Ext: No cyanosis, edema, or  deformity  Skin: No rash. Normal palpation of skin.   Musculoskeletal: Normal digits and nails by inspection. No clubbing.  Neurologic Examination  Mental status/Cognition: somnolent, opens eyes partially to loud voice. Makes eye contact. Nods to his name. Unable to tell month or year. Tracks faces on both sides. Speech/language: limited 2/2 intubation, comprehension intact to simple commands. Cranial nerves:   CN II Pupils equal and reactive to light, no VF deficits   CN III,IV,VI EOM intact, no gaze preference or deviation, no nystagmus   CN V normal sensation in V1, V2, and V3 segments bilaterally   CN VII no asymmetry, no nasolabial fold flattening    CN VIII Turns head towards speech   CN IX & X normal palatal elevation, no uvular deviation    CN XI 5/5 head turn and 5/5 shoulder shrug bilaterally    CN XII midline tongue protrusion    Motor:  Muscle bulk: normal, tone normal. Limited eval 2/2 intubation and sedation. Does lift both arms off the bed but they drift down. Unable to get BL lower extremities off the bed.  Reflexes:  Right Left Comments  Pectoralis      Biceps (C5/6) 2 2   Brachioradialis (C5/6) 2 2    Triceps (C6/7) 2 2    Patellar (L3/4) 1 1    Achilles (S1)      Hoffman      Plantar withdraws withdraws   Jaw jerk    Sensation:  Light touch Intact throughout   Pin prick    Temperature    Vibration   Proprioception    Coordination/Complex Motor:  - Finger to Nose unable to assess.  Labs   CBC:  Recent Labs  Lab 03/25/22 1816 03/25/22 1826  WBC 3.3*  --   NEUTROABS 1.6*  --   HGB 13.5 15.3  HCT 40.2 45.0  MCV 102.6*  --   PLT 295  --     Basic Metabolic Panel:  Lab Results  Component Value Date   NA 141 03/25/2022   K 3.9 03/25/2022   CO2 24 03/25/2022   GLUCOSE 112 (H) 03/25/2022   BUN 14 03/25/2022   CREATININE 1.50 (H) 03/25/2022   CALCIUM 8.9 03/25/2022   GFRNONAA >60 03/25/2022   GFRAA 88 05/17/2020   Lipid Panel:  Lab  Results  Component Value Date   LDLCALC 82 10/10/2021   HgbA1c:  Lab Results  Component Value Date   HGBA1C 5.6 05/23/2021   Urine Drug Screen:     Component Value Date/Time   LABOPIA POSITIVE (A) 01/08/2010 1940   COCAINSCRNUR NONE DETECTED 01/08/2010 1940   LABBENZ NONE DETECTED 01/08/2010 1940   AMPHETMU NONE DETECTED 01/08/2010 1940   THCU NONE DETECTED 01/08/2010 1940   LABBARB  01/08/2010 1940    NONE DETECTED        DRUG SCREEN FOR MEDICAL PURPOSES ONLY.  IF CONFIRMATION IS NEEDED FOR ANY PURPOSE, NOTIFY LAB WITHIN 5 DAYS.        LOWEST DETECTABLE LIMITS FOR URINE DRUG SCREEN Drug Class       Cutoff (ng/mL) Amphetamine      1000 Barbiturate      200 Benzodiazepine   885 Tricyclics       027 Opiates          300 Cocaine  300 THC              50    Alcohol Level     Component Value Date/Time   ETH 181 (H) 03/25/2022 1816    CT Head without contrast(Personally reviewed): CTH was negative for a large hypodensity concerning for a large territory infarct or hyperdensity concerning for an ICH  CT angio Head and Neck with contrast(Personally reviewed): Right M2 branch occlusion with subsequent reconstitution, although downstream opacification is diminished.  CT Perfusion(personally reviewed): No infarct core. Area of decreased perfusion (penumbra) in the posterior right MCA territory measures 53 mL.  MRI Brain(Personally reviewed): Subtle signs of early infarction affecting the right deep insula and a few foci of the white matter in the right corona radiata. No evidence of mass effect or hemorrhage. No evidence of old brain insult.  MR Angio head w/o contrast(personally reviewed): Slow flow in the right MCA branches, particularly within the inferior division.   MR angiography shows poor visualization of the distal MCA branches of both the inferior and superior divisions, consistent with slow flow.  Impression   Alexander Simmons is a 66 y.o. male with  PMH significant for HTN, HLD, glaucoma, tobacco use, alcohol use who presents with fall and left sided weakness. Found to have R MCA M2 occlusion. Given tnkase due to DWI/FLAIR mismatch on MRI brain and transferred to [facility] for thrombectomy with no LVO on Angiogram. He was transferred to Neuro ICU intubated.  Primary Diagnosis:  Cerebral infarction due to embolism of  right middle cerebral artery.   Secondary Diagnosis: Essential (primary) hypertension  Recommendations   Acute R MCA stroke s/p tnkase and angiogram but no thrombectomy: - Frequent NeuroChecks for post tnk care per stroke unit protocol: - Initial CTH demonstrated no acute hemorrhage or mass - MRI Brain - scattered R MCA diffusion restriction. - CTA - R MCA M2 occlusion - CT Perfusion - no core, 25m penumbra. - TTE - pending - Lipid Panel: LDL - pending.  - Statin: pending - HbA1c: pending. - Antithrombotic: Start ASA 81 mg daily if 24 h CTH does not show acute hemorrhage - DVT prophylaxis: SCDs. Pharmacologic prophylaxis if 24 h CTH does not demonstrate acute hemorrhage - Smoking cessation: will need to counsel when extubated. - Systolic Blood Pressure goal: < 180 mm Hg - Telemetry monitoring for arrhythmia: 72 hours - Swallow screen - ordered - PT/OT/SLP consults  HTN: - hold home meds. PRN Labetalol and Cleviprex.  HLD: - LDL pending.  ______________________________________________________________________  This patient is critically ill and at significant risk of neurological worsening, death and care requires constant monitoring of vital signs, hemodynamics,respiratory and cardiac monitoring, neurological assessment, discussion with family, other specialists and medical decision making of high complexity. I spent 45 minutes of neurocritical care time  in the care of  this patient. This was time spent independent of any time provided by nurse practitioner or PA.  SDonnetta SimpersTriad [facility]hospitalists Pager  Number [phone]/08/2021  2:14 AM   Thank you for the opportunity to take part in the care of this patient. If you have any further questions, please contact the neurology consultation attending.  Signed,  SPeruPager Number [phone]_ _ _   _ __   _ __ _ _  __ __   _ __   __ _

## 2022-03-26 NOTE — Assessment & Plan Note (Signed)
HR 88  - Likely source of stroke - Echocardiogram today.

## 2022-03-26 NOTE — Consult Note (Signed)
NAME:  Alexander Simmons, MRN:  [id number], DOB:  1955/10/31, LOS: 1 ADMISSION DATE:  03/25/2022, CONSULTATION DATE:  03/26/22 REFERRING MD:  Lorrin Goodell, CHIEF COMPLAINT:   slurred speech, L-sided weakness  History of Present Illness:  Alexander Simmons is a 66 y.o. M with PMH significant for HTN, ETOH abuse, HL, Glaucoma who presented with slurred speech, fall and L-sided weakness.  History taken from notes as patient is intubated and no family is present.  Pt took the trash out around 10am 7/31 and had a fall, per caregiver he had some slurred speech.  Later in the day he had another fall and some L-sided weakness so presented to [facility] ED and code stroke was activated.  Initial head CT was negative, CTA with R M2 occlusion and MRI brain with signs of R deep insula signs of early infarction.  Pt received TNKase and transferred to [facility] for thrombectomy.  However, angiography showed no LVO, ETOH level was 181 and pt had laceration to tongue after fall so was left intubated.  PCCM consulted for vent management  Pertinent  Medical History   has a past medical history of Alcohol abuse, Allergy, Cor pulmonale ([place]), Glaucoma, Hyperlipidemia, Hypertension, Small bowel obstruction ([place]), Syncope (12/2009), and Tobacco abuse.   Significant Hospital Events: Including procedures, antibiotic start and stop dates in addition to other pertinent events   7/31 presented to [facility] ED, code stroke, evidence of R M2 occlusion 8/1 intubated, taken for angiography and thrombectomy, angiography negative, left intubated  8/1 awake and passed SBT, extubated.   Interim History / Subjective:  Now awake and agitated.  Objective   Blood pressure 101/65, pulse (!) 101, temperature 98.9 F (37.2 C), temperature source Axillary, resp. rate 20, height '5\' 1"'$  (1.549 m), weight 67 kg, SpO2 98 %.    Vent Mode: PSV;CPAP FiO2 (%):  [40 %] 40 % Set Rate:  [14 bmp] 14 bmp Vt Set:  [420 mL] 420 mL PEEP:  [5 cmH20] 5 cmH20 Pressure Support:  [10  cmH20] 10 cmH20 Plateau Pressure:  [14 cmH20-19 cmH20] 14 cmH20   Intake/Output Summary (Last 24 hours) at 03/26/2022 3536 Last data filed at 03/26/2022 0600 Gross per 24 hour  Intake 1449.95 ml  Output 1150 ml  Net 299.95 ml    Filed Weights   03/25/22 1821  Weight: 67 kg    General:  critically ill-appearing M intubated and sedated HEENT: MM pink/moist, pt clenching, but appears likely had tongue laceration, not actively bleeding, pupils equal, sclera anicteric Neuro: examined on propofol, pt is restless, non-purposeful, not following commands, spontaneously moving bilat UE CV: s1s2 rrr, no m/r/g PULM:  mechanically ventilated, no rhonchi or wheezing, compliant with vent  GI: soft, non-distended Extremities: warm/dry, no edema  Skin: no rashes or lesions  Ancillary tests personally reviewed:   High triglycerides consistent with excessive alcohol use.  Cr: 1.36, normally 1.06   Assessment & Plan:  Stroke (cerebrum) ([place]) Presented with slurred speech and left sided weakness.  TNK administered for M2 occlusion with right insular infarction on MRI No LVO on follow-up angiography.  On examination today, no signs of overt focal deficits.   - Secondary stroke prevention. - Stroke rehabilitation.   Dependence on respirator (ventilator) status ([place]) Patient awake and tolerating SBT   - Extubation today.   Alcohol intoxication ([place]) Etoh level 181 on presentation  - Possible component of alcohol intoxication to semiology. - Monitor for alcohol withdrawal.   AKI (acute kidney injury) ([place]) Creatinine: 1.36  -  Likely dehydration from alcohol intoxication.  - Follow creatinine - Continue maintenance fluids  Atrial fibrillation with controlled ventricular response (HCC) HR 88  - Likely source of stroke - Echocardiogram today.    Best Practice (right click and "Reselect all SmartList Selections" daily)   Diet/type: NPO DVT prophylaxis: SCD GI prophylaxis:  PPI Lines: N/A Foley:  Yes, and it is still needed Code Status:  full code Last date of multidisciplinary goals of care discussion [per primary]  CRITICAL CARE Performed by: Kipp Brood   Total critical care time: 40 minutes  Critical care time was exclusive of separately billable procedures and treating other patients.  Critical care was necessary to treat or prevent imminent or life-threatening deterioration.  Critical care was time spent personally by me on the following activities: development of treatment plan with patient and/or surrogate as well as nursing, discussions with consultants, evaluation of patient's response to treatment, examination of patient, obtaining history from patient or surrogate, ordering and performing treatments and interventions, ordering and review of laboratory studies, ordering and review of radiographic studies, pulse oximetry, re-evaluation of patient's condition and participation in multidisciplinary rounds.  Kipp Brood, MD [facility] ICU Physician [place]  Pager: [phone] Mobile: [phone] After hours: [phone].

## 2022-03-26 NOTE — Progress Notes (Signed)
[place] Progress Note Patient Name: Alexander Simmons DOB: [birth year] MRN: [id number]   Date of Service  03/26/2022  HPI/Events of Note  66 y.o. male with PMH significant for HTN, HLD, tobacco use, alcohol use who presents with fall and left sided weakness. Found to have R MCA M2 occlusion. Given tpa. transferred to [facility] for thrombectomy with no occlusion found.   He was transferred to Neuro ICU intubated.  PCCM consulted  eICU Interventions  Chart reviewed     Intervention Category Evaluation Type: New Patient Evaluation  NINO, AMANO, P 03/26/2022, 2:47 AM

## 2022-03-26 NOTE — Progress Notes (Signed)
No MRI result back yet, will pass off to night shift to obtain aspirin order if appropriate.

## 2022-03-26 NOTE — Progress Notes (Signed)
OT Cancellation Note  Patient Details Name: Alexander Simmons MRN: [id number] DOB: [birth year]   Cancelled Treatment:    Reason Eval/Treat Not Completed: Active bedrest order (OT to f/u as activity orders progress.)  Gurpreet Mikhail A Beata Beason 03/26/2022, 7:40 AM

## 2022-03-26 NOTE — Procedures (Signed)
Extubation Procedure Note  Patient Details:   Name: Alexander Simmons DOB: 1955/11/20 MRN: [id number]   Airway Documentation:    Vent end date: 03/26/22 Vent end time: 0800   Evaluation  O2 sats: stable throughout Complications: No apparent complications Patient did tolerate procedure well. Bilateral Breath Sounds: Clear   Yes  Pt extubated to 3L [place], pt tolerated well. Cuff leak present, no stridor noted, RN at bedside, RT will monitor.   Geni Bers Arlissa Monteverde 03/26/2022, 8:21 AM

## 2022-03-26 NOTE — Anesthesia Postprocedure Evaluation (Signed)
Anesthesia Post Note  Patient: Alexander Simmons  Procedure(s) Performed: IR WITH ANESTHESIA     Patient location during evaluation: PACU Anesthesia Type: General Level of consciousness: sedated Pain management: pain level controlled Vital Signs Assessment: post-procedure vital signs reviewed and stable Respiratory status: patient remains intubated per anesthesia plan Cardiovascular status: stable Postop Assessment: no apparent nausea or vomiting Anesthetic complications: no   No notable events documented.  Last Vitals:  Vitals:   03/26/22 1900 03/26/22 2000  BP: (!) 147/86 120/75  Pulse: (!) 104 100  Resp: (!) 25 (!) 26  Temp:  37 C  SpO2: 94% 95%    Last Pain:  Vitals:   03/26/22 2000  TempSrc: Oral  PainSc:                  [place] Lighter

## 2022-03-26 NOTE — Progress Notes (Addendum)
STROKE TEAM PROGRESS NOTE   INTERVAL HISTORY Patient is seen in his room with no family at the bedside.  Yesterday, he had acute onset slurred speech, two falls and acute onset left sided weakness.  MRI scan of the brain shows subtle DWI infarct without flair mismatch and CT perfusion showed no core infarct with 53 cc of tissue at risk hence he was given TNK outside the traditional time window of 4 and half hours and brought here for mechanical thrombectomy for l right M2 occlusion on CTA.  However no LVO was seen on cerebral angiogram to do any intervention.  He was brought to the Neuro ICU and extubated this morning.  Vitals:   03/26/22 1205 03/26/22 1240 03/26/22 1300 03/26/22 1400  BP: 139/79 127/87 (!) 136/91 136/86  Pulse: 93 (!) 104 (!) 115 97  Resp: 17 (!) 23 (!) 30 (!) 21  Temp:      TempSrc:      SpO2: 97% 95% 95% 93%  Weight:      Height:       CBC:  Recent Labs  Lab 03/25/22 1816 03/25/22 1826 03/26/22 0405 03/26/22 0628  WBC 3.3*  --   --  13.1*  NEUTROABS 1.6*  --   --  12.1*  HGB 13.5   < > 12.9* 11.3*  HCT 40.2   < > 38.0* 33.2*  MCV 102.6*  --   --  100.9*  PLT 295  --   --  244   < > = values in this interval not displayed.   Basic Metabolic Panel:  Recent Labs  Lab 03/25/22 1816 03/25/22 1826 03/26/22 0405 03/26/22 0628  NA 140 141 138 136  K 4.0 3.9 3.6 4.2  CL 105 102  --  104  CO2 24  --   --  21*  GLUCOSE 114* 112*  --  118*  BUN 15 14  --  14  CREATININE 1.27* 1.50*  --  1.36*  CALCIUM 8.9  --   --  8.1*   Lipid Panel:  Recent Labs  Lab 03/26/22 0628  CHOL 145  TRIG 647*  HDL 37*  CHOLHDL 3.9  VLDL UNABLE TO CALCULATE IF TRIGLYCERIDE OVER 400 mg/dL  LDLCALC UNABLE TO CALCULATE IF TRIGLYCERIDE OVER 400 mg/dL   HgbA1c:  Recent Labs  Lab 03/26/22 0628  HGBA1C 5.3   Urine Drug Screen:  Recent Labs  Lab 03/26/22 0628  LABOPIA NONE DETECTED  COCAINSCRNUR NONE DETECTED  LABBENZ NONE DETECTED  AMPHETMU NONE DETECTED  THCU NONE  DETECTED  LABBARB NONE DETECTED    Alcohol Level  Recent Labs  Lab 03/25/22 1816  ETH 181*    IMAGING past 24 hours ECHOCARDIOGRAM COMPLETE  Result Date: 03/26/2022    ECHOCARDIOGRAM REPORT   Patient Name:   Alexander Simmons Date of Exam: 03/26/2022 Medical Rec #:  [id number]      Height:       61.0 in Accession #:    [id number]     Weight:       147.6 lb Date of Birth:  [birth year]       BSA:          1.660 m Patient Age:    66 years       BP:           136/86 mmHg Patient Gender: M              HR:  95 bpm. Exam Location:  Inpatient Procedure: 2D Echo, Cardiac Doppler and Color Doppler Indications:    Stroke  History:        Patient has prior history of Echocardiogram examinations, most                 recent 03/22/2014. Risk Factors:Hypertension.  Sonographer:    Jefferey Pica Referring Phys: [id number] [place]  1. Left ventricular ejection fraction, by estimation, is 55 to 60%. The left ventricle has normal function. The left ventricle has no regional wall motion abnormalities. Left ventricular diastolic parameters were normal.  2. Right ventricular systolic function is mildly reduced. The right ventricular size is normal. There is severely elevated pulmonary artery systolic pressure.  3. Left atrial size was moderately dilated.  4. Right atrial size was severely dilated.  5. The aortic valve is tricuspid. Aortic valve regurgitation is not visualized. No aortic stenosis is present.  6. A small pericardial effusion is present. The pericardial effusion is posterior to the left ventricle. There is no evidence of cardiac tamponade.  7. The mitral valve is normal in structure. Mild mitral valve regurgitation. No evidence of mitral stenosis.  8. Tricuspid valve regurgitation is moderate, there are multiple jets.  9. The inferior vena cava is dilated in size with <50% respiratory variability, suggesting right atrial pressure of 15 mmHg. Comparison(s): Tricuspid regurgitation increase  from 2015 report. FINDINGS  Left Ventricle: Left ventricular ejection fraction, by estimation, is 55 to 60%. The left ventricle has normal function. The left ventricle has no regional wall motion abnormalities. The left ventricular internal cavity size was normal in size. There is  no left ventricular hypertrophy. Left ventricular diastolic parameters were normal. Right Ventricle: The right ventricular size is normal. No increase in right ventricular wall thickness. Right ventricular systolic function is mildly reduced. There is severely elevated pulmonary artery systolic pressure. The tricuspid regurgitant velocity is 4.19 m/s, and with an assumed right atrial pressure of 15 mmHg, the estimated right ventricular systolic pressure is 41.3 mmHg. Left Atrium: Left atrial size was moderately dilated. Right Atrium: Right atrial size was severely dilated. Pericardium: A small pericardial effusion is present. The pericardial effusion is posterior to the left ventricle. There is no evidence of cardiac tamponade. Mitral Valve: The mitral valve is normal in structure. Mild mitral valve regurgitation. No evidence of mitral valve stenosis. Tricuspid Valve: The tricuspid valve is normal in structure. Tricuspid valve regurgitation is moderate . No evidence of tricuspid stenosis. Aortic Valve: The aortic valve is tricuspid. Aortic valve regurgitation is not visualized. No aortic stenosis is present. Aortic valve peak gradient measures 5.4 mmHg. Pulmonic Valve: The pulmonic valve was normal in structure. Pulmonic valve regurgitation is not visualized. No evidence of pulmonic stenosis. Aorta: The aortic root and ascending aorta are structurally normal, with no evidence of dilitation. Venous: The inferior vena cava is dilated in size with less than 50% respiratory variability, suggesting right atrial pressure of 15 mmHg. IAS/Shunts: No atrial level shunt detected by color flow Doppler.  LEFT VENTRICLE PLAX 2D LVIDd:         5.00 cm    Diastology LVIDs:         3.90 cm   LV e' lateral: 10.80 cm/s LV PW:         1.00 cm LV IVS:        0.90 cm LVOT diam:     2.10 cm LV SV:         52 LV  SV Index:   32 LVOT Area:     3.46 cm  RIGHT VENTRICLE         IVC TAPSE (M-mode): 1.7 cm  IVC diam: 2.40 cm LEFT ATRIUM             Index        RIGHT ATRIUM           Index LA diam:        3.40 cm 2.05 cm/m   RA Area:     32.20 cm LA Vol (A2C):   46.1 ml 27.77 ml/m  RA Volume:   121.00 ml 72.89 ml/m LA Vol (A4C):   66.7 ml 40.18 ml/m LA Biplane Vol: 59.9 ml 36.08 ml/m  AORTIC VALVE                 PULMONIC VALVE AV Area (Vmax): 3.03 cm     PV Vmax:       0.80 m/s AV Vmax:        116.50 cm/s  PV Peak grad:  2.5 mmHg AV Peak Grad:   5.4 mmHg LVOT Vmax:      102.00 cm/s LVOT Vmean:     57.500 cm/s LVOT VTI:       0.151 m  AORTA Ao Root diam: 3.40 cm Ao Asc diam:  3.00 cm MR Peak grad: 81.0 mmHg   TRICUSPID VALVE MR Vmax:      450.00 cm/s TR Peak grad:   70.2 mmHg                           TR Vmax:        419.00 cm/s                            SHUNTS                           Systemic VTI:  0.15 m                           Systemic Diam: 2.10 cm Rudean Haskell MD Electronically signed by Rudean Haskell MD Signature Date/Time: 03/26/2022/2:57:01 PM    Final    DG CHEST PORT 1 VIEW  Result Date: 03/26/2022 CLINICAL DATA:  [id number] stroke EXAM: PORTABLE CHEST 1 VIEW COMPARISON:  Jan 15, 2020 FINDINGS: The cardiomediastinal silhouette is unchanged and enlarged in contour.ETT tip terminates immediately above the carina. Small LEFT pleural effusion. LEFT basilar airspace opacity, nonspecific. No pneumothorax. Visualized abdomen is unremarkable. Sequela of prior fracture of the LEFT humerus. IMPRESSION: 1. ETT tip terminates immediately above the carina. Recommend retraction. 2. LEFT basilar airspace opacity, nonspecific with differential considerations including infection, atelectasis or aspiration. Small LEFT pleural effusion. Electronically Signed   By:  Valentino Saxon M.D.   On: 03/26/2022 07:45   MR BRAIN WO CONTRAST  Addendum Date: 03/25/2022   ADDENDUM REPORT: 03/25/2022 20:00 ADDENDUM: CTA did show segmental occlusion or near occlusion within the inferior division of the MCA, but did not show any visible abnormality in the superior division. Electronically Signed   By: Nelson Chimes M.D.   On: 03/25/2022 20:00   Result Date: 03/25/2022 CLINICAL DATA:  Neuro deficit, acute, stroke suspected EXAM: MRI HEAD WITHOUT CONTRAST MRA HEAD WITHOUT CONTRAST TECHNIQUE: Multiplanar, multi-echo pulse sequences of the brain and surrounding structures were  acquired without intravenous contrast. Angiographic images of the Circle of Willis were acquired using MRA technique without intravenous contrast. COMPARISON:  CT angiography earlier same day FINDINGS: MRI HEAD FINDINGS Brain: No definite completed acute infarction. One could question a very small focus of restricted diffusion within the radiating white matter tracts of the corona radiata on the right. Also question small cortical infarction in the deep insula. Elsewhere, no focal abnormality affects the brainstem or cerebellum. No old brain insult. No hydrocephalus or extra-axial collection. Vascular: Increased FLAIR signal within MCA branches of the deep insula and right parietal region likely indicating slow flow. Skull and upper cervical spine: Negative Sinuses/Orbits: Mild mucosal inflammatory changes of the sinuses. Orbits negative. Other: None MRA HEAD FINDINGS Anterior circulation: Both internal carotid arteries are patent through the skull base and siphon regions. Left anterior and middle cerebral vessels are normal. Right anterior cerebral vessels are normal. Diminished visualization of more distal MCA branch vessels in both the inferior and superior divisions. Posterior circulation: Both vertebral arteries are patent to the basilar. No basilar stenosis. Superior cerebellar and posterior cerebral arteries  show flow. More distal PCA branch vessels show atherosclerotic narrowing and irregularity. Anatomic variants: None significant. IMPRESSION: Subtle signs of early infarction affecting the right deep insula and a few foci of the white matter in the right corona radiata. No evidence of mass effect or hemorrhage. No evidence of old brain insult. Slow flow in the right MCA branches, particularly within the inferior division. MR angiography shows poor visualization of the distal MCA branches of both the inferior and superior divisions, consistent with slow flow. Occlusion was not shown by CTA done 45 minutes ago. Electronically Signed: By: Nelson Chimes M.D. On: 03/25/2022 19:31   MR ANGIO HEAD WO CONTRAST  Addendum Date: 03/25/2022   ADDENDUM REPORT: 03/25/2022 20:00 ADDENDUM: CTA did show segmental occlusion or near occlusion within the inferior division of the MCA, but did not show any visible abnormality in the superior division. Electronically Signed   By: Nelson Chimes M.D.   On: 03/25/2022 20:00   Result Date: 03/25/2022 CLINICAL DATA:  Neuro deficit, acute, stroke suspected EXAM: MRI HEAD WITHOUT CONTRAST MRA HEAD WITHOUT CONTRAST TECHNIQUE: Multiplanar, multi-echo pulse sequences of the brain and surrounding structures were acquired without intravenous contrast. Angiographic images of the Circle of Willis were acquired using MRA technique without intravenous contrast. COMPARISON:  CT angiography earlier same day FINDINGS: MRI HEAD FINDINGS Brain: No definite completed acute infarction. One could question a very small focus of restricted diffusion within the radiating white matter tracts of the corona radiata on the right. Also question small cortical infarction in the deep insula. Elsewhere, no focal abnormality affects the brainstem or cerebellum. No old brain insult. No hydrocephalus or extra-axial collection. Vascular: Increased FLAIR signal within MCA branches of the deep insula and right parietal region  likely indicating slow flow. Skull and upper cervical spine: Negative Sinuses/Orbits: Mild mucosal inflammatory changes of the sinuses. Orbits negative. Other: None MRA HEAD FINDINGS Anterior circulation: Both internal carotid arteries are patent through the skull base and siphon regions. Left anterior and middle cerebral vessels are normal. Right anterior cerebral vessels are normal. Diminished visualization of more distal MCA branch vessels in both the inferior and superior divisions. Posterior circulation: Both vertebral arteries are patent to the basilar. No basilar stenosis. Superior cerebellar and posterior cerebral arteries show flow. More distal PCA branch vessels show atherosclerotic narrowing and irregularity. Anatomic variants: None significant. IMPRESSION: Subtle signs of early infarction  affecting the right deep insula and a few foci of the white matter in the right corona radiata. No evidence of mass effect or hemorrhage. No evidence of old brain insult. Slow flow in the right MCA branches, particularly within the inferior division. MR angiography shows poor visualization of the distal MCA branches of both the inferior and superior divisions, consistent with slow flow. Occlusion was not shown by CTA done 45 minutes ago. Electronically Signed: By: Nelson Chimes M.D. On: 03/25/2022 19:31   CT ANGIO HEAD NECK W WO CM W PERF (CODE STROKE)  Result Date: 03/25/2022 CLINICAL DATA:  Stroke suspected EXAM: CT ANGIOGRAPHY HEAD AND NECK CT PERFUSION BRAIN TECHNIQUE: Multidetector CT imaging of the head and neck was performed using the standard protocol during bolus administration of intravenous contrast. Multiplanar CT image reconstructions and MIPs were obtained to evaluate the vascular anatomy. Carotid stenosis measurements (when applicable) are obtained utilizing NASCET criteria, using the distal internal carotid diameter as the denominator. Multiphase CT imaging of the brain was performed following IV  bolus contrast injection. Subsequent parametric perfusion maps were calculated using RAPID software. RADIATION DOSE REDUCTION: This exam was performed according to the departmental dose-optimization program which includes automated exposure control, adjustment of the mA and/or kV according to patient size and/or use of iterative reconstruction technique. CONTRAST:  152m OMNIPAQUE IOHEXOL 350 MG/ML SOLN COMPARISON:  CT head 03/25/2022 no prior CTA FINDINGS: CT HEAD FINDINGS For noncontrast findings, please see same day CT head. CTA NECK FINDINGS Aortic arch: Standard branching. Imaged portion shows no evidence of aneurysm or dissection. No significant stenosis of the major arch vessel origins. Right carotid system: No evidence of dissection, occlusion, or hemodynamically significant stenosis (greater than 50%). Left carotid system: No evidence of dissection, occlusion, or hemodynamically significant stenosis (greater than 50%). Vertebral arteries: Left dominant. No evidence of dissection, occlusion, or hemodynamically significant stenosis (greater than 50%). Skeleton: No acute osseous abnormality. Other neck: None. Upper chest: No focal pulmonary opacity or pleural effusion. Review of the MIP images confirms the above findings CTA HEAD FINDINGS Anterior circulation: Both internal carotid arteries are patent to the termini, without significant stenosis. A1 segments patent. Normal anterior communicating artery. Anterior cerebral arteries are patent to their distal aspects. No M1 stenosis or occlusion. Short-segment of a right M2 branch (series 6, images 1 100-101) with distal reconstitution (series 6, image 95), although the remainder of the vessel is somewhat poorly opacified (series 6, image 91). MCA branches otherwise well perfused and symmetric. Posterior circulation: Vertebral arteries patent to the vertebrobasilar junction without stenosis. Posterior inferior cerebellar arteries patent proximally. Basilar patent  to its distal aspect. Superior cerebellar arteries patent proximally. Patent P1 segments. PCAs perfused to their distal aspects without stenosis. The bilateral posterior communicating arteries are patent. Venous sinuses: As permitted by contrast timing, patent. Anatomic variants: None significant. Review of the MIP images confirms the above findings CT Brain Perfusion Findings: ASPECTS: 10 CBF (<30%) Volume: 082mPerfusion (Tmax>6.0s) volume: 5341mismatch Volume: 56m66mfarction Location:No infarct core, penumbra in the posterior left MCA territory IMPRESSION: 1. Occlusion of a right M2 branch, with subsequent reconstitution, although downstream opacification is diminished. 2. No infarct core. Area of decreased perfusion (penumbra) in the posterior right MCA territory measures 53 mL. 3. No other intracranial large vessel occlusion or significant stenosis. 4. No hemodynamically significant stenosis in the neck. These findings were discussed by telephone on 03/25/2022 at 7:00 pm with provider SRISHTI BHAGAT . Electronically Signed   By: AlisMerilyn Baba  M.D.   On: 03/25/2022 19:08   CT Cervical Spine Wo Contrast  Result Date: 03/25/2022 CLINICAL DATA:  Fall with neck pain EXAM: CT CERVICAL SPINE WITHOUT CONTRAST TECHNIQUE: Multidetector CT imaging of the cervical spine was performed without intravenous contrast. Multiplanar CT image reconstructions were also generated. RADIATION DOSE REDUCTION: This exam was performed according to the departmental dose-optimization program which includes automated exposure control, adjustment of the mA and/or kV according to patient size and/or use of iterative reconstruction technique. COMPARISON:  None Available. FINDINGS: Alignment: Straightening of the cervical spine. No subluxation. Facet alignment within normal limits Skull base and vertebrae: No acute fracture. No primary bone lesion or focal pathologic process. Soft tissues and spinal canal: No prevertebral fluid or swelling.  No visible canal hematoma. Disc levels: Mild multilevel degenerative change. Facet degenerative changes at multiple levels Upper chest: Negative. Other: None IMPRESSION: Straightening of the cervical spine.  No acute osseous abnormality Electronically Signed   By: Donavan Foil M.D.   On: 03/25/2022 18:50   CT HEAD CODE STROKE WO CONTRAST  Result Date: 03/25/2022 CLINICAL DATA:  Code stroke. EXAM: CT HEAD WITHOUT CONTRAST TECHNIQUE: Contiguous axial images were obtained from the base of the skull through the vertex without intravenous contrast. RADIATION DOSE REDUCTION: This exam was performed according to the departmental dose-optimization program which includes automated exposure control, adjustment of the mA and/or kV according to patient size and/or use of iterative reconstruction technique. COMPARISON:  01/09/2010 FINDINGS: Brain: No evidence of acute infarction, hemorrhage, cerebral edema, mass, mass effect, or midline shift. No hydrocephalus or extra-axial collection. Vascular: No hyperdense vessel. Skull: Negative for fracture or focal lesion. Sinuses/Orbits: No acute finding. Other: The mastoid air cells are well aerated. ASPECTS [facility] Stroke Program Early CT Score) - Ganglionic level infarction (caudate, lentiform nuclei, internal capsule, insula, M1-M3 cortex): 7 - Supraganglionic infarction (M4-M6 cortex): 3 Total score (0-10 with 10 being normal): 10 IMPRESSION: 1. No acute intracranial process. 2. ASPECTS is 10 Code stroke imaging results were communicated on 03/25/2022 at 6:45 pm to provider ZAMMIT via telephone, who verbally acknowledged these results. Electronically Signed   By: Merilyn Baba M.D.   On: 03/25/2022 18:49    PHYSICAL EXAM General:  Alert, well-nourished, well-developed middle-aged African-American male in no acute distress Respiratory:  Regular, unlabored respirations  NEURO:  Mental Status: AA&Ox3  Speech/Language: speech is without dysarthria or aphasia.  Fluency, and  comprehension intact.  Cranial Nerves:  II: PERRL. Visual fields full.  III, IV, VI: EOMI. Eyelids elevate symmetrically.  V: Sensation is intact to light touch and symmetrical to face.  VII: Smile is symmetrical.   VIII: hearing intact to voice. IX, X: Phonation is normal.  AS:[id number] shrug 5/5. XII: tongue is midline without fasciculations. Motor: 5/5 strength to all muscle groups tested.  Diminished fine finger movements on the left and orbits right over left upper extremity. Tone: is normal and bulk is normal Sensation- Intact to light touch bilaterally but diminished in left arm  Coordination: FTN intact bilaterally, HKS: no ataxia in BLE.No drift.  Gait- deferred   ASSESSMENT/PLAN [name] is a 66 y.o. male with history of HTN, HLD, glaucoma, tobacco use and alcohol use presenting with acute onset slurred speech, two falls and acute onset left sided weakness.  He was given TNK and brought here for mechanical thrombectomy.  No LVO was seen on angiogram.  He was brought to the Neuro ICU and extubated this morning.  MRI shows acute stroke in  right insula and corona radiata.  Stroke:  right insula and corona radiata Etiology:  thrombus dissolved by TNK administration  Code Stroke CT head No acute abnormality.  ASPECTS 10.    CTA head & neck occlusion of right M2 branch CT perfusion no core, 53 mL penumbra MRI  early infarction in right deep insula and corona radiata MRA  slow flow in right MCA branches but no occlusion 2D Echo  EF 55-60%, moderately dilated left atrium, no atrial level shunt LDL UNABLE TO CALCULATE IF TRIGLYCERIDE OVER 400 mg/dL HgbA1c 5.3 VTE prophylaxis - SCDs    Diet   Diet Heart Room service appropriate? Yes with Assist; Fluid consistency: Thin   aspirin 81 mg daily prior to admission, now on No antithrombotic as he is <24 hours from TNK administration Therapy recommendations:  CIR Disposition:  pending  Hypertension Home meds:  amlodipine 5  mg daily, metoprolol 25 mg daily Stable Keep SBP 120-140 for 24 hours then <160 Long-term BP goal normotensive  Hyperlipidemia Home meds:  none LDL UNABLE TO CALCULATE IF TRIGLYCERIDE OVER 400 mg/dL, goal < 70, direct LDL pending Add statin if indicated  Continue statin at discharge   Other Stroke Risk Factors Advanced Age >/= 56  Cigarette smoker advised to stop smoking ETOH use, alcohol level 181, advised to drink no more than 2 drink(s) a day  Other Active Problems none  Hospital day # [place] , MSN, AGACNP-BC [facility] See Amion for schedule and pager information 03/26/2022 3:56 PM   STROKE MD NOTE :  I have personally obtained history,examined this patient, reviewed notes, independently viewed imaging studies, participated in medical decision making and plan of care.ROS completed by me personally and pertinent positives fully documented  I have made any additions or clarifications directly to the above note. Agree with note above.  Patient presented with dysarthria and left-sided weakness due to right M2 occlusion with initial MRI Dynapen showing only subtle diffusion changes without corresponding flair changes and hence he was given IV TNK outside additional 4 window as CT perfusion showed 0 core and 53 cc of salvageable penumbra on right M2 occlusion on CTA.  However the clot was dissolved by TNK and diagnostic cerebral catheter angiogram showed no LVO.  Patient has done well.  Continue close neurological monitoring as per post TNK protocol and strict blood pressure control.  Mobilize out of bed.  Therapy consults.  Repeat MRI scan of the brain later today.  Aspirin and Plavix for 3 weeks followed by Plavix alone he will need loop recorder at discharge for paroxysmal A-fib.  No family available at the bedside for discussion.This patient is critically ill and at significant risk of neurological worsening, death and care requires constant monitoring of  vital signs, hemodynamics,respiratory and cardiac monitoring, extensive review of multiple databases, frequent neurological assessment, discussion with family, other specialists and medical decision making of high complexity.I have made any additions or clarifications directly to the above note.This critical care time does not reflect procedure time, or teaching time or supervisory time of PA/NP/Med Resident etc but could involve care discussion time.  I spent 30 minutes of neurocritical care time  in the care of  this patient.      Antony Contras, MD Medical Director [place] Pager: [phone] 03/26/2022 5:26 PM   To contact Stroke Continuity provider, please refer to [email or web address] After hours, contact General Neurology

## 2022-03-26 NOTE — Progress Notes (Signed)
  Transition of Care [facility]) Screening Note   Patient Details  Name: WASH NIENHAUS Date of Birth: Jan 04, 1956   Transition of Care [facility]) CM/SW Contact:    Benard Halsted, LCSW Phone Number: 03/26/2022, 4:34 PM    Transition of Care Department [facility]) has reviewed patient and no TOC needs have been identified at this time. We will continue to monitor patient advancement through interdisciplinary progression rounds. If new patient transition needs arise, please place a TOC consult.

## 2022-03-26 NOTE — Progress Notes (Signed)
Inpatient Rehab Admissions Coordinator:  ? ?Per therapy recommendations,  patient was screened for CIR candidacy by Naila Elizondo, MS, CCC-SLP. At this time, Pt. Appears to be a a potential candidate for CIR. I will place   order for rehab consult per protocol for full assessment. Please contact me any with questions. ? ?Eleuterio Dollar, MS, CCC-SLP ?Rehab Admissions Coordinator  ?[phone] (celll) ?[phone] (office) ? ?

## 2022-03-26 NOTE — Evaluation (Signed)
Occupational Therapy Evaluation Patient Details Name: Alexander Simmons MRN: [id number] DOB: 1956/03/04 Today's Date: 03/26/2022   History of Present Illness Pt is 66 yo male admitted 03/25/22 with CVA (R deep insula), R M2 occlusion and given TNKase.  He is s/p thrombectomy 03/25/22 with ETT 7/31 to early 03/26/22. There is concern for EtOH withdrawl.  Pt with hx of HTN, ETOH abuse, hyperlipidemia, and glaucoma.   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 Upon evaluation pt has functional deficits in balance, coordination, cognition and activity tolerance. Overall he required min A for transfers and functional mobility, and up to mod A for ADLs. Pt did have a urine incontinent episode. He will benefit from OT acutely. Recommend d/c to AIR for maximal functional progress towards his baseline.      Recommendations for follow up therapy are one component of a multi-disciplinary discharge planning process, led by the attending physician.  Recommendations may be updated based on patient status, additional functional criteria and insurance authorization.   Follow Up Recommendations  Acute inpatient rehab (3hours/day)    Assistance Recommended at Discharge Frequent or constant Supervision/Assistance  Patient can return home with the following A lot of help with walking and/or transfers;A lot of help with bathing/dressing/bathroom;Assistance with cooking/housework;Direct supervision/assist for medications management;Direct supervision/assist for financial management;Assist for transportation;Help with stairs or ramp for entrance    Functional Status Assessment  Patient has had a recent decline in their functional status and demonstrates the ability to make significant improvements in function in a reasonable and predictable amount of time.  Equipment Recommendations  Other (comment);BSC/3in1 (RW)     Recommendations for Other Services Rehab consult     Precautions / Restrictions Precautions Precautions: Fall Restrictions Weight Bearing Restrictions: No      Mobility Bed Mobility Overal bed mobility: Needs Assistance Bed Mobility: Supine to Sit     Supine to sit: Min assist     General bed mobility comments: increased time, use of rail, cues to scoot forward    Transfers Overall transfer level: Needs assistance Equipment used: 2 person hand held assist Transfers: Sit to/from Stand Sit to Stand: Min assist, +2 physical assistance           General transfer comment: Min A of 2 with HHA; pt with tendency to posterior lean - cues to lean forward      Balance Overall balance assessment: Needs assistance Sitting-balance support: No upper extremity supported Sitting balance-Leahy Scale: Fair     Standing balance support: Bilateral upper extremity supported Standing balance-Leahy Scale: Poor Standing balance comment: Requiring UE support with posterior lean tendency                           ADL either performed or assessed with clinical judgement   ADL Overall ADL's : Needs assistance/impaired Eating/Feeding: Independent;Sitting   Grooming: Set up;Sitting   Upper Body Bathing: Supervision/ safety;Sitting   Lower Body Bathing: Moderate assistance;Sit to/from stand   Upper Body Dressing : Supervision/safety;Sitting   Lower Body Dressing: Moderate assistance;Sit to/from stand Lower Body Dressing Details (indicate cue type and reason): need assist for socks at EOB - pt states he is usually able to don socks indep Toilet Transfer: Minimal assistance;Ambulation;Regular Toilet   Toileting- Clothing Manipulation and Hygiene: Supervision/safety;Sitting/lateral lean       Functional mobility during ADLs: Minimal assistance  General ADL Comments: slow and deliberate with impaire balance, and impaired cognition. Incontinent urine this date      Vision Baseline Vision/History: 0 No visual deficits Vision Assessment?: Yes Eye Alignment: Within Functional Limits Ocular Range of Motion: Within Functional Limits Tracking/Visual Pursuits: Able to track stimulus in all quads without difficulty Convergence: Within functional limits Visual Fields: No apparent deficits Additional Comments: seemingly [employer]     Perception     Praxis      Pertinent Vitals/Pain Pain Assessment Pain Assessment: No/denies pain     Hand Dominance Right   Extremity/Trunk Assessment Upper Extremity Assessment Upper Extremity Assessment: RUE deficits/detail;LUE deficits/detail RUE Deficits / Details: slowed and deliberate coordination, geenralized weakness globally LUE Deficits / Details: slowed and deliberate coordination, geenralized weakness globally. Limited over head ROM due to L shoulder pain   Lower Extremity Assessment Lower Extremity Assessment: Defer to PT evaluation RLE Deficits / Details: ROM WFL; MMT 4/5 throughout RLE Sensation: WNL RLE Coordination: decreased gross motor LLE Deficits / Details: ROM WFL; MMT 4/5 throughout LLE Sensation: WNL LLE Coordination: decreased gross motor   Cervical / Trunk Assessment Cervical / Trunk Assessment: Normal Cervical / Trunk Exceptions: Noting tremors throughout all extremities   Communication Communication Communication: No difficulties   Cognition Arousal/Alertness: Awake/alert Behavior During Therapy: WFL for tasks assessed/performed Overall Cognitive Status: Impaired/Different from baseline Area of Impairment: Orientation, Memory, Following commands, Safety/judgement, Awareness, Problem solving                 Orientation Level: Disoriented to, Time, Situation   Memory: Decreased short-term memory Following Commands: Follows one step commands consistently, Follows multi-step commands inconsistently Safety/Judgement: Decreased awareness of deficits Awareness:  Emergent Problem Solving: Requires verbal cues, Requires tactile cues General Comments: Pt had episode of urinary incontinence and was not able to tell therapist in time to get urinal or use restroom     General Comments  vss; Pt was on 4 L O2 with sats 100%, tried 2 L remained 98%, tried RA remained 98%.  Left on RA and notified RN    Exercises     Shoulder Instructions      Home Living Family/patient expects to be discharged to:: Private residence Living Arrangements: Other relatives Available Help at Discharge: Other (Comment) Type of Home: House Home Access: Stairs to enter [employer] of Steps: 4 Entrance Stairs-Rails: Right;Left Home Layout: One level     Bathroom Shower/Tub: Occupational psychologist: Handicapped height     Home Equipment: None          Prior Functioning/Environment Prior Level of Function : Independent/Modified Independent             Mobility Comments: no AD; pt could ambulate in community ADLs Comments: 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[employer], 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 and IADLs        OT Problem List: Decreased strength;Decreased range of motion;Decreased activity tolerance;Impaired balance (sitting and/or standing);Decreased safety awareness;Decreased knowledge of use of DME or AE;Decreased knowledge of precautions;Pain;Decreased cognition      OT Treatment/Interventions:      OT Goals(Current goals can be found in the care plan section) ADL Goals Pt Will Perform Grooming: with supervision;standing Pt Will Perform Lower Body Dressing: with supervision;sit to/from stand Pt Will Transfer to Toilet: with supervision;ambulating;regular height toilet Additional ADL Goal #1: Pt will indep complete IADL medication management task  OT Frequency:      Co-evaluation PT/OT/SLP Co-Evaluation/Treatment: Yes Reason for Co-Treatment: Complexity of the patient's impairments (  multi-system involvement);For patient/therapist safety;To  address functional/ADL transfers   OT goals addressed during session: ADL's and self-care      AM-PAC OT "6 Clicks" Daily Activity     Outcome Measure                 End of Session Equipment Utilized During Treatment: Gait belt Nurse Communication: Mobility status  Activity Tolerance: Patient tolerated treatment well Patient left: in chair;with call bell/phone within reach;with chair alarm set  OT Visit Diagnosis: Unsteadiness on feet (R26.81);Other abnormalities of gait and mobility (R26.89);Muscle weakness (generalized) (M62.81);History of falling (Z91.81);Pain                Time: 4967-5916 OT Time Calculation (min): 28 min Charges:  OT General Charges $OT Visit: 1 Visit OT Evaluation $OT Eval Moderate Complexity: 1 Mod    Xaniyah Buchholz A Celise Bazar 03/26/2022, 5:02 PM

## 2022-03-26 NOTE — Assessment & Plan Note (Signed)
Creatinine: 1.36  - Likely dehydration from alcohol intoxication.  - Follow creatinine - Continue maintenance fluids

## 2022-03-26 NOTE — Progress Notes (Signed)
SLP Cancellation Note  Patient Details Name: ICKER SWIGERT MRN: [id number] DOB: [birth year]   Cancelled treatment:       Reason Eval/Treat Not Completed: Patient not medically ready (Pt is currently on the vent. SLP will follow up on subsequent date unless contacted sooner.)  Masyn Rostro I. Hardin Negus, [place], [place] Office number [phone] Pager [place] 03/26/2022, 7:57 AM

## 2022-03-26 NOTE — Evaluation (Signed)
Physical Therapy Evaluation Patient Details Name: Alexander Simmons MRN: [id number] DOB: 1955/10/14 Today's Date: 03/26/2022  History of Present Illness  Pt is 66 yo male admitted 03/25/22 with CVA (R deep insula), R M2 occlusion and given TNKase.  He is s/p thrombectomy 03/25/22 with ETT 7/31 to early 03/26/22. There is concern for EtOH withdrawl.  Pt with hx of HTN, ETOH abuse, hyperlipidemia, and glaucoma.  Clinical Impression  Pt admitted with above diagnosis. 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  Pt currently with functional limitations due to the deficits listed below (see PT Problem List). He required min A of 2 for transfers and to ambulate 5'.  Pt unsteady and with tremors throughout. Pt had c/o dizziness in sitting: BP was stable, no nystagmus noted, EOEM and gaze stabilization intact. He was able to tolerate activity on RA.  Pt will benefit from skilled PT to increase their independence and safety with mobility to allow discharge to the venue listed below.          Recommendations for follow up therapy are one component of a multi-disciplinary discharge planning process, led by the attending physician.  Recommendations may be updated based on patient status, additional functional criteria and insurance authorization.  Follow Up Recommendations Acute inpatient rehab (3hours/day)      Assistance Recommended at Discharge Frequent or constant Supervision/Assistance  Patient can return home with the following  A lot of help with walking and/or transfers;A lot of help with bathing/dressing/bathroom;Assistance with feeding;Assistance with cooking/housework;Direct supervision/assist for financial management;Direct supervision/assist for medications management    Equipment Recommendations Rolling walker (2 wheels)  Recommendations for Other Services  Rehab consult    Functional Status Assessment Patient has had a recent decline in  their functional status and demonstrates the ability to make significant improvements in function in a reasonable and predictable amount of time.     Precautions / Restrictions Precautions Precautions: Fall Restrictions Weight Bearing Restrictions: No      Mobility  Bed Mobility Overal bed mobility: Needs Assistance Bed Mobility: Supine to Sit     Supine to sit: Min assist     General bed mobility comments: increased time, use of rail, cues to scoot forward    Transfers Overall transfer level: Needs assistance Equipment used: 2 person hand held assist Transfers: Sit to/from Stand Sit to Stand: Min assist, +2 physical assistance           General transfer comment: Min A of 2 with HHA; pt with tendency to posterior lean - cues to lean forward    Ambulation/Gait Ambulation/Gait assistance: Min assist, +2 physical assistance Gait Distance (Feet): 5 Feet Assistive device: 2 person hand held assist Gait Pattern/deviations: Step-to pattern, Decreased stride length, Shuffle Gait velocity: decreased     General Gait Details: Unsteady gait , provided HHA of 2 and min A to steady  Stairs            Wheelchair Mobility    Modified Rankin (Stroke Patients Only) Modified Rankin (Stroke Patients Only) Pre-Morbid Rankin Score: No symptoms Modified Rankin: Moderately severe disability     Balance Overall balance assessment: Needs assistance Sitting-balance support: No upper extremity supported Sitting balance-Leahy Scale: Fair     Standing balance support: Bilateral upper extremity supported Standing balance-Leahy Scale: Poor Standing balance comment: Requiring UE support with posterior lean tendency  Pertinent Vitals/Pain Pain Assessment Pain Assessment: No/denies pain    Home Living Family/patient expects to be discharged to:: Private residence Living Arrangements: Other relatives Available Help at Discharge: Other  (Comment) (lives with aunt, he is caregiver) Type of Home: House Home Access: Stairs to enter Entrance Stairs-Rails: Psychiatric nurse of Steps: 4   Home Layout: One level Home Equipment: None      Prior Function Prior Level of Function : Independent/Modified Independent             Mobility Comments: no AD; pt could ambulate in community ADLs Comments: 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[employer], 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 and IADLs     Hand Dominance        Extremity/Trunk Assessment   Upper Extremity Assessment Upper Extremity Assessment: Defer to OT evaluation    Lower Extremity Assessment Lower Extremity Assessment: LLE deficits/detail;RLE deficits/detail RLE Deficits / Details: ROM WFL; MMT 4/5 throughout RLE Sensation: WNL RLE Coordination: decreased gross motor LLE Deficits / Details: ROM WFL; MMT 4/5 throughout LLE Sensation: WNL LLE Coordination: decreased gross motor    Cervical / Trunk Assessment Cervical / Trunk Assessment: Normal Cervical / Trunk Exceptions: Noting tremors throughout all extremities  Communication      Cognition Arousal/Alertness: Awake/alert Behavior During Therapy: WFL for tasks assessed/performed Overall Cognitive Status: Impaired/Different from baseline Area of Impairment: Orientation, Memory, Following commands, Safety/judgement, Awareness, Problem solving                 Orientation Level: Disoriented to, Time, Situation   Memory: Decreased short-term memory Following Commands: Follows one step commands consistently, Follows multi-step commands inconsistently Safety/Judgement: Decreased awareness of deficits Awareness: Emergent Problem Solving: Requires verbal cues, Requires tactile cues General Comments: Pt had episode of urinary incontinence and was not able to tell therapist in time to get urinal or use restroom        General Comments General comments (skin integrity, edema, etc.): vss; Pt was on 4 L O2  with sats 100%, tried 2 L remained 98%, tried RA remained 98%.  Left on RA and notified RN    Exercises     Assessment/Plan    PT Assessment Patient needs continued PT services  PT Problem List Decreased strength;Decreased mobility;Decreased safety awareness;Decreased activity tolerance;Decreased cognition;Cardiopulmonary status limiting activity;Decreased balance;Decreased knowledge of use of DME;Decreased knowledge of precautions;Decreased coordination       PT Treatment Interventions DME instruction;Therapeutic activities;Modalities;Gait training;Therapeutic exercise;Patient/family education;Stair training;Balance training;Functional mobility training;Neuromuscular re-education    PT Goals (Current goals can be found in the Care Plan section)  Acute Rehab PT Goals Patient Stated Goal: not stated PT Goal Formulation: Patient unable to participate in goal setting Time For Goal Achievement: 04/09/22 Potential to Achieve Goals: Good    Frequency Min 4X/week     Co-evaluation PT/OT/SLP Co-Evaluation/Treatment: Yes Reason for Co-Treatment: Complexity of the patient's impairments (multi-system involvement) (initial eval pt recently extubated after CVA with CIWA protocol)   OT goals addressed during session: ADL's and self-care       AM-PAC PT "6 Clicks" Mobility  Outcome Measure Help needed turning from your back to your side while in a flat bed without using bedrails?: A Little Help needed moving from lying on your back to sitting on the side of a flat bed without using bedrails?: A Little Help needed moving to and from a bed to a chair (including a wheelchair)?: A Lot Help needed standing up from a chair using your arms (e.g., wheelchair or bedside chair)?: A  Lot Help needed to walk in hospital room?: Total Help needed climbing 3-5 steps with a railing? : Total 6 Click Score: 12    End of Session Equipment Utilized During Treatment: Gait belt Activity Tolerance: Patient  tolerated treatment well Patient left: with chair alarm set;in chair;with call bell/phone within reach Nurse Communication: Mobility status PT Visit Diagnosis: Other abnormalities of gait and mobility (R26.89);Muscle weakness (generalized) (M62.81)    Time: 4599-7741 PT Time Calculation (min) (ACUTE ONLY): 28 min   Charges:   PT Evaluation $PT Eval Moderate Complexity: 1 Mod          Alexander Simmons, PT Acute Rehab [facility] Rehab [phone]   Karlton Lemon 03/26/2022, 2:14 PM

## 2022-03-26 NOTE — ED Notes (Signed)
Pt transported to MRI 

## 2022-03-26 NOTE — Assessment & Plan Note (Signed)
Presented with slurred speech and left sided weakness.  TNK administered for M2 occlusion with right insular infarction on MRI No LVO on follow-up angiography.  On examination today, no signs of overt focal deficits.   - Secondary stroke prevention. - Stroke rehabilitation.

## 2022-03-26 NOTE — Consult Note (Addendum)
NAME:  Alexander Simmons, MRN:  [id number], DOB:  01/04/1956, LOS: 1 ADMISSION DATE:  03/25/2022, CONSULTATION DATE:  03/26/22 REFERRING MD:  Lorrin Goodell, CHIEF COMPLAINT:   slurred speech, L-sided weakness  History of Present Illness:  Alexander Simmons is a 66 y.o. M with PMH significant for HTN, ETOH abuse, HL, Glaucoma who presented with slurred speech, fall and L-sided weakness.  History taken from notes as patient is intubated and no family is present.  Pt took the trash out around 10am 7/31 and had a fall, per caregiver he had some slurred speech.  Later in the day he had another fall and some L-sided weakness so presented to [facility] ED and code stroke was activated.  Initial head CT was negative, CTA with R M2 occlusion and MRI brain with signs of R deep insula signs of early infarction.  Pt received TNKase and transferred to [facility] for thrombectomy.  However, angiography showed no LVO, ETOH level was 181 and pt had laceration to tongue after fall so was left intubated.  PCCM consulted for vent management  Pertinent  Medical History   has a past medical history of Alcohol abuse, Allergy, Cor pulmonale ([place]), Glaucoma, Hyperlipidemia, Hypertension, Small bowel obstruction ([place]), Syncope (12/2009), and Tobacco abuse.   Significant Hospital Events: Including procedures, antibiotic start and stop dates in addition to other pertinent events   7/31 presented to [facility] ED, code stroke, evidence of R M2 occlusion 8/1 intubated, taken for angiography and thrombectomy, angiography negative, left intubated   Interim History / Subjective:  Agitated on propfol  Objective   Blood pressure (!) 131/94, pulse 94, temperature 98 F (36.7 C), resp. rate (!) 30, height '5\' 1"'$  (1.549 m), weight 67 kg, SpO2 97 %.    Vent Mode: PRVC FiO2 (%):  [40 %] 40 % Set Rate:  [14 bmp] 14 bmp Vt Set:  [420 mL] 420 mL PEEP:  [5 cmH20] 5 cmH20 Plateau Pressure:  [18 cmH20-19 cmH20] 18 cmH20   Intake/Output Summary (Last 24 hours) at  03/26/2022 0144 Last data filed at 03/26/2022 0030 Gross per 24 hour  Intake 800 ml  Output 900 ml  Net -100 ml   Filed Weights   03/25/22 1821  Weight: 67 kg    General:  critically ill-appearing M intubated and sedated HEENT: MM pink/moist, pt clenching, but appears likely had tongue laceration, not actively bleeding, pupils equal, sclera anicteric Neuro: examined on propofol, pt is restless, non-purposeful, not following commands, spontaneously moving bilat UE CV: s1s2 rrr, no m/r/g PULM:  mechanically ventilated, no rhonchi or wheezing, compliant with vent  GI: soft, non-distended Extremities: warm/dry, no edema  Skin: no rashes or lesions   Resolved Hospital Problem list     Assessment & Plan:   R MCA CVA with 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 -HOB elevated 30 degrees. -Plateau pressures less than 30 cm H20.  -Follow chest x-ray, ABG prn.   -Bronchial hygiene and RT/bronchodilator protocol.   AKI Creatinine 1.27, recent baseline 1.06-1.07 -monitor BMP, electrolytes, UOP and avoid nephrotoxins    ETOH abuse  -level 181 on admission -thiamine, MV, folic acid  -monitor for signs of withdrawal  Best Practice (right click and "Reselect all SmartList Selections" daily)   Diet/type: NPO DVT prophylaxis: SCD GI prophylaxis: PPI Lines: N/A Foley:  Yes, and it is still needed Code Status:  full code Last date of multidisciplinary goals of care discussion [per primary]  Labs   CBC: Recent Labs  Lab 03/25/22 1816 03/25/22 1826  WBC 3.3*  --   NEUTROABS 1.6*  --   HGB 13.5 15.3   HCT 40.2 45.0  MCV 102.6*  --   PLT 295  --     Basic Metabolic Panel: Recent Labs  Lab 03/25/22 1816 03/25/22 1826  NA 140 141  K 4.0 3.9  CL 105 102  CO2 24  --   GLUCOSE 114* 112*  BUN 15 14  CREATININE 1.27* 1.50*  CALCIUM 8.9  --    GFR: Estimated Creatinine Clearance: 39.3 mL/min (A) (by C-G formula based on SCr of 1.5 mg/dL (H)). Recent Labs  Lab 03/25/22 1816  WBC 3.3*    Liver Function Tests: Recent Labs  Lab 03/25/22 1816  AST 27  ALT 20  ALKPHOS 77  BILITOT 1.3*  PROT 7.3  ALBUMIN 3.9   No results for input(s): "LIPASE", "AMYLASE" in the last 168 hours. No results for input(s): "AMMONIA" in the last 168 hours.  ABG    Component Value Date/Time   TCO2 24 03/25/2022 1826     Coagulation Profile: Recent Labs  Lab 03/25/22 1816  INR 1.1    Cardiac Enzymes: No results for input(s): "CKTOTAL", "CKMB", "CKMBINDEX", "TROPONINI" in the last 168 hours.  HbA1C: Hgb A1c MFr Bld  Date/Time Value Ref Range Status  05/23/2021 08:44 AM 5.6 4.8 - 5.6 % Final    Comment:             Prediabetes: 5.7 - 6.4          Diabetes: >6.4          Glycemic control for adults with diabetes: <7.0   01/03/2021 10:46 AM 5.7 (H) 4.8 - 5.6 % Final    Comment:             Prediabetes: 5.7 - 6.4          Diabetes: >6.4          Glycemic control for adults with diabetes: <7.0     CBG: Recent Labs  Lab 03/25/22 1813  GLUCAP 113*    Review of Systems:   Unable to obtain  Past Medical History:  He,  has a past medical history of Alcohol abuse, Allergy, Cor pulmonale ([place]), Glaucoma, Hyperlipidemia, Hypertension, Small bowel obstruction ([place]), Syncope (12/2009), and Tobacco abuse.   Surgical History:   Past Surgical History:  Procedure Laterality Date   ABDOMINAL SURGERY     COLONOSCOPY N/A 01/28/2013   Procedure: COLONOSCOPY;  Surgeon: Rogene Houston, MD;  Location: [facility];  Service: Endoscopy;  Laterality: N/A;  [place]   2005   Dr. Irving Shows   TOOTH EXTRACTION       Social History:   reports that he has never smoked. His smokeless tobacco use includes snuff and chew. He reports current alcohol use of about 20.0 standard drinks of alcohol per week. He reports that he does not use drugs.   Family History:  His family history includes Other in an other family member. There is no history of Colon cancer.   Allergies No Known Allergies   Home Medications  Prior to Admission medications  Medication Sig Start Date End Date Taking? Authorizing Provider  amLODipine (NORVASC) 5 MG tablet Take 1 tablet (5 mg total) by mouth daily. 05/23/21   Fayrene Helper, MD  amoxicillin (AMOXIL) 500 MG capsule Take 500 mg by mouth 3 (three) times daily. 09/25/21   [provider]  ibuprofen (ADVIL) 800 MG tablet Take 800 mg by mouth every 8 (eight) hours as needed. 09/25/21   [provider]  metoprolol tartrate (LOPRESSOR) 25 MG tablet Take 1 tablet (25 mg total) by mouth 2 (two) times daily. 05/23/21   Fayrene Helper, MD     Critical care time: 40 minutes       CRITICAL CARE Performed by: Otilio Carpen Yuridiana Formanek   Total critical care time: 40 minutes  Critical care time was exclusive of separately billable procedures and treating other patients.  Critical care was necessary to treat or prevent imminent or life-threatening deterioration.  Critical care was time spent personally by me on the following activities: development of treatment plan with patient and/or surrogate as well as nursing, discussions with consultants, evaluation of patient's response to treatment, examination of patient, obtaining history from patient or surrogate, ordering and performing treatments and interventions, ordering and review of laboratory studies, ordering and review of radiographic studies, pulse oximetry and re-evaluation of patient's condition.   Otilio Carpen Eleonor Ocon, PA-C [facility] See  Amion for pager If no response to pager , please call [phone] until 7pm After 7:00 pm call Elink  [phone]?[place]

## 2022-03-26 NOTE — Assessment & Plan Note (Signed)
Patient awake and tolerating SBT   - Extubation today.

## 2022-03-26 NOTE — Progress Notes (Signed)
BPs under prescribed goal of 120-140. Fentanyl gtt turned off.  MDs Agarwala and Leonie Man notified via secure chat.

## 2022-03-26 NOTE — Assessment & Plan Note (Signed)
Etoh level 181 on presentation  - Possible component of alcohol intoxication to semiology. - Monitor for alcohol withdrawal.

## 2022-03-26 NOTE — Progress Notes (Signed)
RT transported patient form PACU up to 4N without any complications

## 2022-03-27 ENCOUNTER — Other Ambulatory Visit (HOSPITAL_COMMUNITY): Payer: Self-pay

## 2022-03-27 ENCOUNTER — Telehealth (HOSPITAL_COMMUNITY): Payer: Self-pay | Admitting: Pharmacy Technician

## 2022-03-27 DIAGNOSIS — I639 Cerebral infarction, unspecified: Secondary | ICD-10-CM | POA: Diagnosis not present

## 2022-03-27 DIAGNOSIS — I4891 Unspecified atrial fibrillation: Secondary | ICD-10-CM | POA: Diagnosis not present

## 2022-03-27 HISTORY — PX: IR ANGIO VERTEBRAL SEL SUBCLAVIAN INNOMINATE UNI R MOD SED: IMG5365

## 2022-03-27 LAB — LDL CHOLESTEROL, DIRECT: Direct LDL: 30 mg/dL (ref 0–99)

## 2022-03-27 LAB — TSH: TSH: 1.529 u[IU]/mL (ref 0.350–4.500)

## 2022-03-27 MED ORDER — APIXABAN 5 MG PO TABS
5.0000 mg | ORAL_TABLET | Freq: Two times a day (BID) | ORAL | Status: DC
  Administered 2022-03-27 – 2022-03-28 (×2): 5 mg via ORAL
  Filled 2022-03-27 (×2): qty 1

## 2022-03-27 MED ORDER — CHLORHEXIDINE GLUCONATE CLOTH 2 % EX PADS
6.0000 | MEDICATED_PAD | Freq: Every day | CUTANEOUS | Status: DC
Start: 2022-03-28 — End: 2022-03-28
  Administered 2022-03-28: 6 via TOPICAL

## 2022-03-27 MED ORDER — PANTOPRAZOLE SODIUM 40 MG PO TBEC
40.0000 mg | DELAYED_RELEASE_TABLET | Freq: Every day | ORAL | Status: DC
  Administered 2022-03-27 – 2022-03-28 (×2): 40 mg via ORAL
  Filled 2022-03-27 (×2): qty 1

## 2022-03-27 NOTE — Telephone Encounter (Signed)
Pharmacy Patient Advocate Encounter  Insurance verification completed.    The patient is insured through [employer] Medicare Part D   The patient is currently admitted and ran test claims for the following: Eliquis, Xarelto.  Copays and coinsurance results were relayed to Inpatient clinical team.      

## 2022-03-27 NOTE — Progress Notes (Signed)
Physical Therapy Treatment Patient Details Name: Alexander Simmons MRN: [id number] DOB: 09/30/1955 Today's Date: 03/27/2022   History of Present Illness Pt is 66 yo male admitted 03/25/22 with CVA (R deep insula), R M2 occlusion and given TNKase.  He is s/p thrombectomy 03/25/22 with ETT 7/31 to early 03/26/22. There is concern for EtOH withdrawl.  Pt with hx of HTN, ETOH abuse, hyperlipidemia, and glaucoma.    PT Comments    Pt with good progress. He was oriented x 4 today but still with cognitive deficits.  Tremors had improved and pt able to increase gait but with multiple LOB requiring RW and assist to recover.  Requiring mod cues with gait for safety.  Pt also continued to c/o mild dizziness and reports hitting posterior head when fell.  Tested and was + for R posterior canal BPPV - performed Epley and pt tolerated well.  Added vestibular rehab to POC.      Recommendations for follow up therapy are one component of a multi-disciplinary discharge planning process, led by the attending physician.  Recommendations may be updated based on patient status, additional functional criteria and insurance authorization.  Follow Up Recommendations  Acute inpatient rehab (3hours/day)     Assistance Recommended at Discharge Frequent or constant Supervision/Assistance  Patient can return home with the following A lot of help with walking and/or transfers;A lot of help with bathing/dressing/bathroom;Assistance with feeding;Assistance with cooking/housework;Direct supervision/assist for financial management;Direct supervision/assist for medications management   Equipment Recommendations  Rolling walker (2 wheels)    Recommendations for Other Services Rehab consult     Precautions / Restrictions Precautions Precautions: Fall     Mobility  Bed Mobility Overal bed mobility: Needs Assistance Bed Mobility: Supine to Sit, Sit to Supine     Supine to sit: Min guard Sit to supine: Supervision   General  bed mobility comments: increased time, use of rail, cues to scoot forward    Transfers Overall transfer level: Needs assistance Equipment used: Rolling walker (2 wheels) Transfers: Sit to/from Stand Sit to Stand: Min assist           General transfer comment: Pt with multiple LOB upon standing requiring min A to steady; Performed x 4    Ambulation/Gait Ambulation/Gait assistance: Min assist Gait Distance (Feet): 100 Feet Assistive device: Rolling walker (2 wheels) Gait Pattern/deviations: Step-through pattern, Decreased stride length Gait velocity: decreased     General Gait Details: Pt initially unsteady with 4 LOB within 40' requiring min A to recover, did improve with last 60'.  Cues for RW use.   Stairs             Wheelchair Mobility    Modified Rankin (Stroke Patients Only) Modified Rankin (Stroke Patients Only) Pre-Morbid Rankin Score: No symptoms Modified Rankin: Moderately severe disability     Balance Overall balance assessment: Needs assistance Sitting-balance support: No upper extremity supported Sitting balance-Leahy Scale: Good     Standing balance support: Bilateral upper extremity supported, No upper extremity supported Standing balance-Leahy Scale: Poor Standing balance comment: Had LOB with initial gait ; required RW; did improve over session and able to work on standing balance without UE support               High Level Balance Comments: Standing with min guard feet apart and feet together 30 sec, reaching 3" outside BOS min guard, turn circle min guard increased time.  Tried standing marching but needing min A at time 10 reps  Cognition Arousal/Alertness: Awake/alert Behavior During Therapy: WFL for tasks assessed/performed Overall Cognitive Status: Impaired/Different from baseline Area of Impairment: Memory, Following commands, Safety/judgement, Awareness, Problem solving                     Memory:  Decreased short-term memory Following Commands: Follows one step commands consistently, Follows multi-step commands inconsistently Safety/Judgement: Decreased awareness of deficits Awareness: Emergent Problem Solving: Requires verbal cues, Requires tactile cues General Comments: Pt A and O x 4 today but little recall; unable to count up by 3; for 3 animals with "c" said cat, rabbit, dog;        Exercises      General Comments General comments (skin integrity, edema, etc.): VSS on RA.  Pt had c/o mild dizziness.  He had difficulty describing other than "dizzy."  States worse OOB.  BP was stable.  Did not worsen with head turns or smooth pursuit.  Smooth pursuit and gaze stabilization intact.  Pt mentioned hitting posterior head when he fell.  Moved back to bed and performed [facility] which was negative on the left but + on right with upward rotational nystagmus and symptoms that eased in 15-20 seconds.  Performed Epley Manuevar.  Pt tolerated well.  Educated on BPPV.      Pertinent Vitals/Pain Pain Assessment Pain Assessment: No/denies pain    Home Living       Type of Home: House                  Prior Function            PT Goals (current goals can now be found in the care plan section) Progress towards PT goals: Progressing toward goals    Frequency    Min 4X/week      PT Plan Current plan remains appropriate;Other (comment) (Addition of vestibular rehab (habituation exercises, balance, canalith repositioning))    Co-evaluation              AM-PAC PT "6 Clicks" Mobility   Outcome Measure  Help needed turning from your back to your side while in a flat bed without using bedrails?: A Little Help needed moving from lying on your back to sitting on the side of a flat bed without using bedrails?: A Little Help needed moving to and from a bed to a chair (including a wheelchair)?: A Little Help needed standing up from a chair using your arms (e.g.,  wheelchair or bedside chair)?: A Little Help needed to walk in hospital room?: A Lot (mod cues for RW) Help needed climbing 3-5 steps with a railing? : A Lot 6 Click Score: 16    End of Session Equipment Utilized During Treatment: Gait belt Activity Tolerance: Patient tolerated treatment well Patient left: in bed;with call bell/phone within reach;with bed alarm set Nurse Communication: Mobility status PT Visit Diagnosis: Other abnormalities of gait and mobility (R26.89);Muscle weakness (generalized) (M62.81)     Time: 1610-9604 PT Time Calculation (min) (ACUTE ONLY): 34 min  Charges:  $Gait Training: 8-22 mins $Canalith Rep Proc: 8-22 mins                     Abran Richard, PT Acute Rehab [facility] [phone]    Alexander Simmons 03/27/2022, 2:04 PM

## 2022-03-27 NOTE — Progress Notes (Signed)
PCCM Brief Note  Discussed with Dr. Leonie Man. No PCCM needs. Likely transferring out of unit today. Neuro will contact us if further needs arise.   Alexander Simmons, [facility] For pager details, please see AMION or use Epic chat  After 1900, please call [facility] for cross coverage needs 03/27/2022, 8:42 AM

## 2022-03-27 NOTE — Progress Notes (Addendum)
STROKE TEAM PROGRESS NOTE   INTERVAL HISTORY No family at the bedside, plan to transfer out of ICU today. In afib on the monitor. Cardiology consulted for new onset afib. Cleared by neurology to start oral anticoagulation tonight.  Patient is seen in his room with no family at the bedside.  Neurological exam is stable.  Vital signs stable.  No complaints today. Repeat MRI yesterday shows small new punctate acute infarct in the right frontal, left posterior frontal and right occipital cortex. Vitals:   03/27/22 1000 03/27/22 1100 03/27/22 1200 03/27/22 1300  BP: 125/82 129/81 (!) 140/110 (!) 136/98  Pulse: 88 78 86 73  Resp: (!) 22 (!) 25 (!) 29 (!) 22  Temp:   98.5 F (36.9 C)   TempSrc:   Oral   SpO2: 90% 100% 92% 95%  Weight:      Height:       CBC:  Recent Labs  Lab 03/25/22 1816 03/25/22 1826 03/26/22 0405 03/26/22 0628  WBC 3.3*  --   --  13.1*  NEUTROABS 1.6*  --   --  12.1*  HGB 13.5   < > 12.9* 11.3*  HCT 40.2   < > 38.0* 33.2*  MCV 102.6*  --   --  100.9*  PLT 295  --   --  244   < > = values in this interval not displayed.   Basic Metabolic Panel:  Recent Labs  Lab 03/25/22 1816 03/25/22 1826 03/26/22 0405 03/26/22 0628  NA 140 141 138 136  K 4.0 3.9 3.6 4.2  CL 105 102  --  104  CO2 24  --   --  21*  GLUCOSE 114* 112*  --  118*  BUN 15 14  --  14  CREATININE 1.27* 1.50*  --  1.36*  CALCIUM 8.9  --   --  8.1*   Lipid Panel:  Recent Labs  Lab 03/26/22 0628  CHOL 145  TRIG 647*  HDL 37*  CHOLHDL 3.9  VLDL UNABLE TO CALCULATE IF TRIGLYCERIDE OVER 400 mg/dL  LDLCALC UNABLE TO CALCULATE IF TRIGLYCERIDE OVER 400 mg/dL   HgbA1c:  Recent Labs  Lab 03/26/22 0628  HGBA1C 5.3   Urine Drug Screen:  Recent Labs  Lab 03/26/22 0628  LABOPIA NONE DETECTED  COCAINSCRNUR NONE DETECTED  LABBENZ NONE DETECTED  AMPHETMU NONE DETECTED  THCU NONE DETECTED  LABBARB NONE DETECTED    Alcohol Level  Recent Labs  Lab 03/25/22 1816  ETH 181*    IMAGING  past 24 hours MR BRAIN WO CONTRAST  Result Date: 03/26/2022 CLINICAL DATA:  Slurred speech, falls, left-sided weakness EXAM: MRI HEAD WITHOUT CONTRAST TECHNIQUE: Multiplanar, multiecho pulse sequences of the brain and surrounding structures were obtained without intravenous contrast. COMPARISON:  03/25/2022 FINDINGS: Brain: New punctate foci of restricted diffusion with ADC correlates in the right frontal lobe cortex (series 2, image 40), left posterior frontal lobe cortex (series 2, image 42), and right occipital lobe cortex (series 2, image 26). The previously noted focus of mildly increased signal on diffusion-weighted imaging in the right corona radiata is no longer seen. No acute hemorrhage, mass, mass effect, or midline shift. No hemosiderin deposition to suggest remote hemorrhage. No hydrocephalus or extra-axial collection. Vascular: Normal arterial flow voids. Previously noted FLAIR signal abnormality in the right MCA branches is no longer seen. Skull and upper cervical spine: Normal marrow signal. Sinuses/Orbits: Mucosal thickening in the maxillary sinuses. Otherwise negative. Other: The mastoids are well aerated. IMPRESSION: New punctate  acute infarcts in the right frontal, left posterior frontal, and right occipital lobe cortex. Electronically Signed   By: Merilyn Baba M.D.   On: 03/26/2022 19:41   ECHOCARDIOGRAM COMPLETE  Result Date: 03/26/2022    ECHOCARDIOGRAM REPORT   Patient Name:   DUC CROCKET Date of Exam: 03/26/2022 Medical Rec #:  [id number]      Height:       61.0 in Accession #:    [id number]     Weight:       147.6 lb Date of Birth:  05/07/1956       BSA:          1.660 m Patient Age:    66 years       BP:           136/86 mmHg Patient Gender: M              HR:           95 bpm. Exam Location:  Inpatient Procedure: 2D Echo, Cardiac Doppler and Color Doppler Indications:    Stroke  History:        Patient has prior history of Echocardiogram examinations, most                 recent  03/22/2014. Risk Factors:Hypertension.  Sonographer:    Jefferey Pica Referring Phys: [id number] [place]  1. Left ventricular ejection fraction, by estimation, is 55 to 60%. The left ventricle has normal function. The left ventricle has no regional wall motion abnormalities. Left ventricular diastolic parameters were normal.  2. Right ventricular systolic function is mildly reduced. The right ventricular size is normal. There is severely elevated pulmonary artery systolic pressure.  3. Left atrial size was moderately dilated.  4. Right atrial size was severely dilated.  5. The aortic valve is tricuspid. Aortic valve regurgitation is not visualized. No aortic stenosis is present.  6. A small pericardial effusion is present. The pericardial effusion is posterior to the left ventricle. There is no evidence of cardiac tamponade.  7. The mitral valve is normal in structure. Mild mitral valve regurgitation. No evidence of mitral stenosis.  8. Tricuspid valve regurgitation is moderate, there are multiple jets.  9. The inferior vena cava is dilated in size with <50% respiratory variability, suggesting right atrial pressure of 15 mmHg. Comparison(s): Tricuspid regurgitation increase from 2015 report. FINDINGS  Left Ventricle: Left ventricular ejection fraction, by estimation, is 55 to 60%. The left ventricle has normal function. The left ventricle has no regional wall motion abnormalities. The left ventricular internal cavity size was normal in size. There is  no left ventricular hypertrophy. Left ventricular diastolic parameters were normal. Right Ventricle: The right ventricular size is normal. No increase in right ventricular wall thickness. Right ventricular systolic function is mildly reduced. There is severely elevated pulmonary artery systolic pressure. The tricuspid regurgitant velocity is 4.19 m/s, and with an assumed right atrial pressure of 15 mmHg, the estimated right ventricular systolic  pressure is 95.1 mmHg. Left Atrium: Left atrial size was moderately dilated. Right Atrium: Right atrial size was severely dilated. Pericardium: A small pericardial effusion is present. The pericardial effusion is posterior to the left ventricle. There is no evidence of cardiac tamponade. Mitral Valve: The mitral valve is normal in structure. Mild mitral valve regurgitation. No evidence of mitral valve stenosis. Tricuspid Valve: The tricuspid valve is normal in structure. Tricuspid valve regurgitation is moderate . No evidence of tricuspid stenosis. Aortic Valve: The aortic  valve is tricuspid. Aortic valve regurgitation is not visualized. No aortic stenosis is present. Aortic valve peak gradient measures 5.4 mmHg. Pulmonic Valve: The pulmonic valve was normal in structure. Pulmonic valve regurgitation is not visualized. No evidence of pulmonic stenosis. Aorta: The aortic root and ascending aorta are structurally normal, with no evidence of dilitation. Venous: The inferior vena cava is dilated in size with less than 50% respiratory variability, suggesting right atrial pressure of 15 mmHg. IAS/Shunts: No atrial level shunt detected by color flow Doppler.  LEFT VENTRICLE PLAX 2D LVIDd:         5.00 cm   Diastology LVIDs:         3.90 cm   LV e' lateral: 10.80 cm/s LV PW:         1.00 cm LV IVS:        0.90 cm LVOT diam:     2.10 cm LV SV:         52 LV SV Index:   32 LVOT Area:     3.46 cm  RIGHT VENTRICLE         IVC TAPSE (M-mode): 1.7 cm  IVC diam: 2.40 cm LEFT ATRIUM             Index        RIGHT ATRIUM           Index LA diam:        3.40 cm 2.05 cm/m   RA Area:     32.20 cm LA Vol (A2C):   46.1 ml 27.77 ml/m  RA Volume:   121.00 ml 72.89 ml/m LA Vol (A4C):   66.7 ml 40.18 ml/m LA Biplane Vol: 59.9 ml 36.08 ml/m  AORTIC VALVE                 PULMONIC VALVE AV Area (Vmax): 3.03 cm     PV Vmax:       0.80 m/s AV Vmax:        116.50 cm/s  PV Peak grad:  2.5 mmHg AV Peak Grad:   5.4 mmHg LVOT Vmax:      102.00  cm/s LVOT Vmean:     57.500 cm/s LVOT VTI:       0.151 m  AORTA Ao Root diam: 3.40 cm Ao Asc diam:  3.00 cm MR Peak grad: 81.0 mmHg   TRICUSPID VALVE MR Vmax:      450.00 cm/s TR Peak grad:   70.2 mmHg                           TR Vmax:        419.00 cm/s                            SHUNTS                           Systemic VTI:  0.15 m                           Systemic Diam: 2.10 cm Rudean Haskell MD Electronically signed by Rudean Haskell MD Signature Date/Time: 03/26/2022/2:57:01 PM    Final     PHYSICAL EXAM General:  Alert, well-nourished, well-developed middle-aged African-American male in no acute distress Respiratory:  Regular, unlabored respirations  NEURO:  Mental Status: AA&Ox3  Speech/Language: speech  is without dysarthria or aphasia.  Fluency, and comprehension intact.  Cranial Nerves:  II: PERRL. Visual fields full.  III, IV, VI: EOMI. Eyelids elevate symmetrically.  V: Sensation is intact to light touch and symmetrical to face.  VII: Smile is symmetrical.   VIII: hearing intact to voice. IX, X: Phonation is normal.  UX:NATFTDDU shrug 5/5. XII: tongue is midline without fasciculations. Motor: 5/5 strength to all muscle groups tested.  Diminished fine finger movements on the left and orbits right over left upper extremity. Tone: is normal and bulk is normal Sensation- Intact to light touch bilaterally but diminished in left arm  Coordination: FTN intact bilaterally, HKS: no ataxia in BLE. No drift. Rapid alternating movements slowed bilaterally Gait- deferred   ASSESSMENT/PLAN [name] is a 66 y.o. male with history of HTN, HLD, glaucoma, tobacco use and alcohol use presenting with acute onset slurred speech, two falls and acute onset left sided weakness.  He was given TNK and brought here for mechanical thrombectomy.  No LVO was seen on angiogram, no intervention done.  He was brought to the Neuro ICU and extubated this morning.  MRI shows acute stroke in  right insula and corona radiata. He is in atrial fibrillation on the monitor, cardiology recommends anticoagulation with eliquis.   Stroke:  right insula and corona radiata Etiology:  thrombus dissolved by TNK administration etiology cardiogenic embolism from new onset A-fib Code Stroke CT head No acute abnormality.  ASPECTS 10.    CTA head & neck occlusion of right M2 branch CT perfusion no core, 53 mL penumbra MRI  early infarction in right deep insula and corona radiata MRA  slow flow in right MCA branches but no occlusion 2D Echo  EF 55-60%, moderately dilated left atrium, no atrial level shunt LDL 30 HgbA1c 5.3 VTE prophylaxis - SCDs    Diet   Diet Heart Room service appropriate? Yes with Assist; Fluid consistency: Thin   aspirin 81 mg daily prior to admission, now on ASA and Plavix-> will switch to eliquis per cardiology  Therapy recommendations:  CIR Disposition:  pending  Hypertension Home meds:  amlodipine 5 mg daily, metoprolol 25 mg daily Stable Keep SBP 120-140 for 24 hours then <160 Long-term BP goal normotensive  Hyperlipidemia Home meds:  none LDL 30, goal < 70, direct LDL pending Add statin if indicated  Continue statin at discharge  Atrial Fibrillation- new onset Cardiology consulted Rate is controlled CHA2DS2-VASc Score = 4  Recommend Eliquis '5mg'$  BID   Other Stroke Risk Factors Advanced Age >/= 1  Cigarette smoker advised to stop smoking ETOH use, alcohol level 181, advised to drink no more than 2 drink(s) a day  Other Active Problems Triglycerides read at 634 -> repeat pending  Hospital day # 2  Patient seen and examined by NP/APP with MD. MD to update note as needed.   Janine Ores, DNP, FNP-BC [facility] Pager: [phone]  I have personally obtained history,examined this patient, reviewed notes, independently viewed imaging studies, participated in medical decision making and plan of care.ROS completed by me personally and  pertinent positives fully documented  I have made any additions or clarifications directly to the above note. Agree with note above.  Patient presented with embolic M2 occlusion which resolved after IV TNK with repeat MRI shows by cerebral embolic infarcts and he is being found to be in new onset A-fib.  Agree with discontinuing antiplatelet therapy and switching to Eliquis from milligram twice daily for stroke  prevention.  Cardiology consult.  Mobilize out of bed.  Transfer out of ICU and likely discharge in the next few days to inpatient rehab.  Discussed with critical care medicine and cardiology.This patient is critically ill and at significant risk of neurological worsening, death and care requires constant monitoring of vital signs, hemodynamics,respiratory and cardiac monitoring, extensive review of multiple databases, frequent neurological assessment, discussion with family, other specialists and medical decision making of high complexity.I have made any additions or clarifications directly to the above note.This critical care time does not reflect procedure time, or teaching time or supervisory time of PA/NP/Med Resident etc but could involve care discussion time.  I spent 30 minutes of neurocritical care time  in the care of  this patient.      Antony Contras, MD Medical Director [facility] Pager: [phone] 03/27/2022 2:16 PM   To contact Stroke Continuity provider, please refer to [email or web address] After hours, contact General Neurology

## 2022-03-27 NOTE — Evaluation (Signed)
Speech Language Pathology Evaluation Patient Details Name: Alexander Simmons MRN: [id number] DOB: 1956-05-31 Today's Date: 03/27/2022 Time: 1030-1051 SLP Time Calculation (min) (ACUTE ONLY): 21 min  Problem List:  Patient Active Problem List   Diagnosis Date Noted   Alcohol intoxication ([place]) 03/26/2022   AKI (acute kidney injury) ([place]) 03/26/2022   Atrial fibrillation with controlled ventricular response ([place]) 03/26/2022   Dependence on respirator (ventilator) status ([place])    Stroke (cerebrum) ([place]) 03/25/2022   Vitamin D deficiency 05/23/2021   IGT (impaired glucose tolerance) 05/23/2021   Shoulder pain, left 01/11/2021   Assistance with transportation 11/21/2020   Reduced vision 04/06/2019   Right inguinal hernia 10/07/2018   Renal calculus, right 10/07/2018   Overweight (BMI 25.0-29.9) 01/21/2016   Hypertension, essential 97/35/3299   Alcoholic cardiomyopathy ([place]) 04/08/2014   Hyperlipidemia    Tobacco abuse    Palpitations 01/05/2013   Alcohol dependence ([place]) 02/21/2010   MENTAL RETARDATION, MILD 01/12/2008   Past Medical History:  Past Medical History:  Diagnosis Date   Alcohol abuse    Allergy    Cor pulmonale ([place])    By echocardiography   Glaucoma    Hyperlipidemia    Hypertension    Small bowel obstruction ([place])    Syncope 12/2009   Attributed to use of narcotics plus alcohol   Tobacco abuse    Past Surgical History:  Past Surgical History:  Procedure Laterality Date   ABDOMINAL SURGERY     COLONOSCOPY N/A 01/28/2013   Procedure: COLONOSCOPY;  Surgeon: Rogene Houston, MD;  Location: [facility];  Service: Endoscopy;  Laterality: N/A;  1225   IR ANGIO EXTRACRAN SEL COM CAROTID INNOMINATE UNI BILAT MOD SED  03/25/2022   PILONIDAL CYST EXCISION  2005   Dr. Irving Shows   RADIOLOGY WITH ANESTHESIA N/A 03/25/2022   Procedure: IR WITH ANESTHESIA;  Surgeon: Radiologist, Medication, MD;  Location: [facility];  Service: Radiology;  Laterality: N/A;   TOOTH EXTRACTION      HPI:  Pt is 66 yo male presenting with acute onset slurred speech, two falls and acute onset left sided weakness. He was given TNKase.  He is s/p thrombectomy 03/25/22 with ETT 7/31 to early 03/26/22. MRI (7/31) revealed infarctions in the right insula and corona radiata. MRI (8/1) revealed "New punctate acute infarcts in the right frontal, left posterior frontal, and right occipital lobe...'. There is concern for EtOH withdrawl.  Pt with hx of HTN, ETOH abuse, hyperlipidemia, and glaucoma.   Assessment / Plan / Recommendation Clinical Impression  Pt with severe cognitive communication deficits and mild dysarthria post CVA. [employer] Mental Status Examination (SLUMS) administered to assess cognitive function, with the pt yeilding the following score: 15/30, suggestive of severe cognitive impairment. He demonstrates deficits in orientation to time (year incorrect), immediate/delayed recall of listed items (4/5), recall of paragraph level information (4/8), and executive functions/problem solving during mathematical, serial number and clock drawing task. He engaged in simple conversation with clinician, demonstrating 80-100% intelligibility throughout. Suspect impact of mild CN VII (facial) and XII (hyoglossal) dysfunction post CVA. Pt reports that while his speech has improved since admission, that it is not yet at baseline. With increased vocal intensity and slow rate of speech, intelligibility improved at the sentence level. Receptive and expressive language were [facility] for tasks provided. SLP services warranted to f/u for treatment of cognitive functions and motor speech.    SLP Assessment  SLP Recommendation/Assessment: Patient needs continued Speech Lanaguage Pathology Services SLP Visit Diagnosis:  Dysarthria and anarthria (R47.1);Cognitive communication deficit (R41.841)    Recommendations for follow up therapy are one component of a multi-disciplinary discharge planning process, led by  the attending physician.  Recommendations may be updated based on patient status, additional functional criteria and insurance authorization.    Follow Up Recommendations  Acute inpatient rehab (3hours/day)    Assistance Recommended at Discharge  Frequent or constant Supervision/Assistance  Functional Status Assessment Patient has had a recent decline in their functional status and demonstrates the ability to make significant improvements in function in a reasonable and predictable amount of time.  Frequency and Duration min 2x/week  2 weeks      SLP Evaluation Cognition  Overall Cognitive Status: Impaired/Different from baseline Arousal/Alertness: Awake/alert Orientation Level: Oriented to person;Oriented to place;Disoriented to time Year:  (2002) Month: August Day of Week: Correct Attention: Sustained Sustained Attention: Appears intact Memory: Impaired Memory Impairment: Storage deficit;Retrieval deficit;Decreased recall of new information Awareness: Impaired Awareness Impairment: Intellectual impairment Problem Solving: Impaired Problem Solving Impairment: Functional basic Executive Function: Organizing;Self Monitoring;Self Correcting;Sequencing Sequencing: Impaired Sequencing Impairment: Verbal basic Organizing: Impaired Organizing Impairment: Functional basic Self Monitoring: Impaired Self Monitoring Impairment: Functional basic Self Correcting: Impaired Self Correcting Impairment: Functional basic       Comprehension  Auditory Comprehension Overall Auditory Comprehension: Appears within functional limits for tasks assessed Commands: Within Functional Limits Conversation: Simple Visual Recognition/Discrimination Discrimination: Not tested    Expression Expression Primary Mode of Expression: Verbal Verbal Expression Overall Verbal Expression: Appears within functional limits for tasks assessed Initiation: No impairment Automatic Speech: Name;Day of week;Month  of year Level of Generative/Spontaneous Verbalization: Conversation Naming: No impairment Written Expression Dominant Hand: Right Written Expression: Not tested   Oral / Motor  Oral Motor/Sensory Function Overall Oral Motor/Sensory Function: Mild impairment Facial ROM: Reduced left Facial Symmetry: Abnormal symmetry left;Suspected CN VII (facial) dysfunction Lingual ROM: Within Functional Limits Lingual Symmetry: Abnormal symmetry left;Suspected CN XII (hypoglossal) dysfunction Motor Speech Overall Motor Speech: Impaired Respiration: Within functional limits Phonation: Normal Resonance: Within functional limits Articulation: Impaired Level of Impairment: Conversation Intelligibility: Intelligibility reduced Word: 75-100% accurate Phrase: 75-100% accurate Sentence: 75-100% accurate Conversation: 75-100% accurate Motor Planning: Witnin functional limits        Alexander Simmons, Alexander Simmons, Alexander Simmons Office Number: [phone]        Alexander Simmons 03/27/2022, 11:09 AM

## 2022-03-27 NOTE — Consult Note (Cosign Needed)
Cardiology Consultation:   Patient ID: Alexander Simmons MRN: [id number]; DOB: [birth year]  Admit date: 03/25/2022 Date of Consult: 03/27/2022  PCP:  Fayrene Helper, MD   [facility] Providers Cardiologist:  Kate Sable, MD (Inactive)   {   Patient Profile:   Alexander Simmons is a 66 y.o. male with a hx of HTN, HLD, paroxysmal SVT, tobacco use, alcohol use,  who is being seen 03/27/2022 for the evaluation of A fib at the request of Dr Leonie Man.  History of Present Illness:   Mr. Marschall with above PMH presented to ER on 03/25/22 for 2 falls, slurred speech, left sided weakness.  He was given tnkase at [place] on 03/25/22 via telemedicine neuro consult and transferred here for thrombectomy. No LVO on CTA. He was found to have acute ischemic stroke of  right insula and corona radiata on MRI of brain. He is admitted to NICU for monitor. Cardiology is consulted today due to newly diagnosed A fib.   He states he never had A fib in the past. He felt irregular heart beat 03/25/22 while empty trash. He denied any chest pain, SOB, heart palpitation, dizziness, syncope, weight gain, leg edema. He is unsteady now since the stroke. He denied hx of frequent falls. He denied any major bleeding in the past.   He remotely saw Dr Lattie Haw 2014 for palpitation, reportedly has intellectual impairment and was a limited historian. EKG at the time showed SR with frequent PVCs with diffuse T-wave inversions suggesting apical ischemia or LVH. Stress Echo 02/18/13 showed LVEF 65-705, no RWMA, no evidence of stress induced ischemia. Holter monitor 02/23/13 showed short runs of SVT. He was started on metoprolol '25mg'$  BID.   Echo from 03/22/13 with LVEF 55-60%, grade 2 DD, mild LVH, trace MR and mild TR. He followed up with Dr Bronson Ing in 2016 and 2017, had no cardiac symptoms, was advised to continue metoprolol and lisinopril for HTN and Paroxysmal SVT. He was advised on ETOH cessation given alcoholic cardiomyopathy. He was  last seen by Dr Bronson Ing 08/2019 via telemedicine visit, was doing well without issue and continued on metoprolol.   Admission diagnostic here showed elevated Cr 1.3-1.5, elevated triglyceride 647, folate and Vitamin B12 deficiency,  WBC 13100 and Hgb 11.3. EKG from 03/25/22 18:18 showed A fib with ventricular rate of 97 bpm. Repeat EKG from 03/26/22 9:30 with A fib with ventricular rate of 100s with PVC. Echo from 03/26/22 showed LVEF 55-60%, no RWMA, mild reduced RV, mod LAE, severe RAE, small pericardial effusion, mild MR, moderate TR.    Past Medical History:  Diagnosis Date   Alcohol abuse    Allergy    Cor pulmonale (HCC)    By echocardiography   Glaucoma    Hyperlipidemia    Hypertension    Small bowel obstruction ([place])    Syncope 12/2009   Attributed to use of narcotics plus alcohol   Tobacco abuse     Past Surgical History:  Procedure Laterality Date   ABDOMINAL SURGERY     COLONOSCOPY N/A 01/28/2013   Procedure: COLONOSCOPY;  Surgeon: Rogene Houston, MD;  [facility];  Service: Endoscopy;  Laterality: N/A;  1225   IR ANGIO EXTRACRAN SEL COM CAROTID INNOMINATE UNI BILAT MOD SED  03/25/2022   PILONIDAL CYST EXCISION  2005   Dr. Irving Shows   RADIOLOGY WITH ANESTHESIA N/A 03/25/2022   Procedure: IR WITH ANESTHESIA;  Surgeon: Radiologist, Medication, MD;  Location: [facility];  Service: Radiology;  Laterality: N/A;   TOOTH EXTRACTION       Home Medications:  Prior to Admission medications   Medication Sig Start Date End Date Taking? Authorizing Provider  amLODipine (NORVASC) 5 MG tablet Take 1 tablet (5 mg total) by mouth daily. 05/23/21  Yes Fayrene Helper, MD  aspirin 81 MG chewable tablet Chew 81 mg by mouth daily.   Yes [provider]  ibuprofen (ADVIL) 800 MG tablet Take 800 mg by mouth every 8 (eight) hours as needed. 09/25/21  Yes [provider]  PILOCARPINE-EPINEPHRINE OP Apply 1 drop to eye daily.   Yes [provider]  amoxicillin  (AMOXIL) 500 MG capsule Take 500 mg by mouth 3 (three) times daily. Patient not taking: Reported on 03/26/2022 09/25/21   [provider]  metoprolol tartrate (LOPRESSOR) 25 MG tablet Take 1 tablet (25 mg total) by mouth 2 (two) times daily. Patient not taking: Reported on 03/26/2022 05/23/21   Fayrene Helper, MD    Inpatient Medications: Scheduled Meds:   stroke: early stages of recovery book   Does not apply Once   aspirin  81 mg Oral Daily   Chlorhexidine Gluconate Cloth  6 each Topical Q0600   clopidogrel  75 mg Oral Daily   enoxaparin (LOVENOX) injection  40 mg Subcutaneous D92E   folic acid  1 mg Oral Daily   multivitamin with minerals  1 tablet Oral Daily   pantoprazole  40 mg Oral Daily   thiamine (VITAMIN B1) injection  100 mg Intravenous Daily   Or   thiamine  100 mg Oral Daily   Continuous Infusions:  sodium chloride 40 mL/hr at 03/27/22 0800   PRN Meds: acetaminophen **OR** acetaminophen (TYLENOL) oral liquid 160 mg/5 mL **OR** acetaminophen, labetalol, LORazepam **OR** LORazepam, mouth rinse, senna-docusate  Allergies:   No Known Allergies  Social History:   Social History   Socioeconomic History   Marital status: Single    Spouse name: Not on file   Number of children: 1   Years of education: 12   Highest education level: 12th grade  Occupational History   Occupation: disabled   Tobacco Use   Smoking status: Never   Smokeless tobacco: Current    Types: Snuff, Chew   Tobacco comments:    chews tobacco since age 7.  Vaping Use   Vaping Use: Former  Substance and Sexual Activity   Alcohol use: Yes    Alcohol/week: 20.0 standard drinks of alcohol    Types: 20 Shots of liquor per week    Comment: 1 pint of gin daily (4cups daily)   Drug use: No   Sexual activity: Not Currently  Other Topics Concern   Not on file  Social History Narrative   Lives with his niece    Social Determinants of Health   Financial Resource Strain: Low Risk   (10/08/2021)   Overall Financial Resource Strain (CARDIA)    Difficulty of Paying Living Expenses: Not very hard  Food Insecurity: No Food Insecurity (10/08/2021)   Hunger Vital Sign    Worried About Running Out of Food in the Last Year: Never true    Ran Out of Food in the Last Year: Never true  Transportation Needs: No Transportation Needs (10/08/2021)   PRAPARE - Hydrologist (Medical): No    Lack of Transportation (Non-Medical): No  Physical Activity: Insufficiently Active (10/08/2021)   Exercise Vital Sign    Days of Exercise per Week: 3 days  Minutes of Exercise per Session: 20 min  Stress: No Stress Concern Present (10/03/2020)   [place]    Feeling of Stress : Not at all  Social Connections: Socially Isolated (10/08/2021)   Social Connection and Isolation Panel [NHANES]    Frequency of Communication with Friends and Family: More than three times a week    Frequency of Social Gatherings with Friends and Family: More than three times a week    Attends Religious Services: Never    Marine scientist or Organizations: No    Attends Archivist Meetings: Never    Marital Status: Never married  Intimate Partner Violence: Not At Risk (10/03/2020)   Humiliation, Afraid, Rape, and Kick questionnaire    Fear of Current or Ex-Partner: No    Emotionally Abused: No    Physically Abused: No    Sexually Abused: No    Family History:    Family History  Problem Relation Age of Onset   Other Other        FH unknown-patient raised in foster care system   Colon cancer Neg Hx      ROS:  Constitutional: Denied fever, chills, malaise, night sweats Eyes: Denied vision change or loss Ears/Nose/Mouth/Throat: Denied ear ache, sore throat, coughing, sinus pain Cardiovascular: see HPI  Respiratory: denied shortness of breath Gastrointestinal: Denied nausea, vomiting, abdominal pain,  diarrhea Genital/Urinary: Denied dysuria, hematuria, urinary frequency/urgency Musculoskeletal: Denied muscle ache, joint pain, weakness Skin: Denied rash, wound Neuro: see  HPI Psych: Denied history of depression/anxiety  Endocrine: Denied history of diabetes    Physical Exam/Data:   Vitals:   03/27/22 0800 03/27/22 0900 03/27/22 1000 03/27/22 1100  BP:  (!) 139/98 125/82 129/81  Pulse: 80 84 88 78  Resp: 18 (!) 25 (!) 22 (!) 25  Temp: 97.7 F (36.5 C)     TempSrc: Oral     SpO2: 97% 97% 90% 100%  Weight:      Height:        Intake/Output Summary (Last 24 hours) at 03/27/2022 1149 Last data filed at 03/27/2022 0800 Gross per 24 hour  Intake 1968.49 ml  Output 2050 ml  Net -81.51 ml      03/25/2022    6:21 PM 10/10/2021    8:04 AM 05/23/2021    8:06 AM  Last 3 Weights  Weight (lbs) 147 lb 9.6 oz 151 lb 0.6 oz 149 lb  Weight (kg) 66.951 kg 68.511 kg 67.586 kg     Body mass index is 27.89 kg/m.   Vitals:  Vitals:   03/27/22 1000 03/27/22 1100  BP: 125/82 129/81  Pulse: 88 78  Resp: (!) 22 (!) 25  Temp:    SpO2: 90% 100%   General Appearance: In no apparent distress, walking with a walker, unsteady  HEENT: Normocephalic, atraumatic.  Neck: Supple, trachea midline, no JVDs Cardiovascular: irregularly irregular,S1 S2, no murmur  Respiratory: Resting breathing unlabored, lungs sounds clear to auscultation bilaterally, no use of accessory muscles. On room air.  No wheezes, rales or rhonchi.   Gastrointestinal: Bowel sounds positive, abdomen soft, non-tender, non-distended. No mass or organomegaly.  Extremities: Able to move all extremities in bed without difficulty, no edema Musculoskeletal: Normal muscle bulk and tone, unsteady gait, left sided weakness Skin: Intact, warm, dry. No rashes or petechiae noted in exposed areas.  Neurologic: Alert, oriented to person, place and time. Short and fluent speech, no facial droop, no cognitive deficit Psychiatric: Normal  affect.  Mood is appropriate.    EKG:  The EKG was personally reviewed and demonstrates:  EKG from 03/25/22 18:18 showed A fib with ventricular rate of 97 bpm.  EKG from 03/26/22 9:30 with A fib with ventricular rate of 100s with PVC.  Telemetry:  Telemetry was personally reviewed and demonstrates:  A fib with ventricular rate of 90s.  Relevant CV Studies:  Echo from 03/26/22:   1. Left ventricular ejection fraction, by estimation, is 55 to 60%. The  left ventricle has normal function. The left ventricle has no regional  wall motion abnormalities. Left ventricular diastolic parameters were  normal.   2. Right ventricular systolic function is mildly reduced. The right  ventricular size is normal. There is severely elevated pulmonary artery  systolic pressure.   3. Left atrial size was moderately dilated.   4. Right atrial size was severely dilated.   5. The aortic valve is tricuspid. Aortic valve regurgitation is not  visualized. No aortic stenosis is present.   6. A small pericardial effusion is present. The pericardial effusion is  posterior to the left ventricle. There is no evidence of cardiac  tamponade.   7. The mitral valve is normal in structure. Mild mitral valve  regurgitation. No evidence of mitral stenosis.   8. Tricuspid valve regurgitation is moderate, there are multiple jets.   9. The inferior vena cava is dilated in size with <50% respiratory  variability, suggesting right atrial pressure of 15 mmHg.   Comparison(s): Tricuspid regurgitation increase from 2015 report.    Laboratory Data:  High Sensitivity Troponin:  No results for input(s): "TROPONINIHS" in the last 720 hours.   Chemistry Recent Labs  Lab 03/25/22 1816 03/25/22 1826 03/26/22 0405 03/26/22 0628  NA 140 141 138 136  K 4.0 3.9 3.6 4.2  CL 105 102  --  104  CO2 24  --   --  21*  GLUCOSE 114* 112*  --  118*  BUN 15 14  --  14  CREATININE 1.27* 1.50*  --  1.36*  CALCIUM 8.9  --   --  8.1*  GFRNONAA >60   --   --  57*  ANIONGAP 11  --   --  11    Recent Labs  Lab 03/25/22 1816  PROT 7.3  ALBUMIN 3.9  AST 27  ALT 20  ALKPHOS 77  BILITOT 1.3*   Lipids  Recent Labs  Lab 03/26/22 0628  CHOL 145  TRIG 647*  HDL 37*  LDLCALC UNABLE TO CALCULATE IF TRIGLYCERIDE OVER 400 mg/dL  CHOLHDL 3.9    Hematology Recent Labs  Lab 03/25/22 1816 03/25/22 1826 03/26/22 0405 03/26/22 0628  WBC 3.3*  --   --  13.1*  RBC 3.92*  --   --  3.29*  HGB 13.5 15.3 12.9* 11.3*  HCT 40.2 45.0 38.0* 33.2*  MCV 102.6*  --   --  100.9*  MCH 34.4*  --   --  34.3*  MCHC 33.6  --   --  34.0  RDW 13.7  --   --  13.5  PLT 295  --   --  244   Thyroid No results for input(s): "TSH", "FREET4" in the last 168 hours.  BNPNo results for input(s): "BNP", "PROBNP" in the last 168 hours.  DDimer No results for input(s): "DDIMER" in the last 168 hours.   Radiology/Studies:  IR ANGIO EXTRACRAN SEL COM CAROTID INNOMINATE UNI BILAT MOD SED  Result Date: 03/27/2022 CLINICAL DATA:  New onset left-sided weakness. Occluded inferior division distal M2 branch on CT angiogram of the head and neck. EXAM: IR ANGIO EXTRACRAN SELECT COM CAROTID INNOMINATE BILAT MOD SED COMPARISON:  CT of the head and neck March 25, 2022. MEDICATIONS: Heparin 0 units IV. Ancef 2 g IV antibiotic was administered within 1 hour of the procedure. ANESTHESIA/SEDATION: General anesthesia. CONTRAST:  Omnipaque 300 approximately 40 cc. FLUOROSCOPY TIME:  Fluoroscopy Time: 6 minutes 42 seconds (614 mGy). COMPLICATIONS: None immediate. TECHNIQUE: Informed written consent was obtained from the patient's caregiver after a thorough discussion of the procedural risks, benefits and alternatives. All questions were addressed. Maximal Sterile Barrier Technique was utilized including caps, mask, sterile gowns, sterile gloves, sterile drape, hand hygiene and skin antiseptic. A timeout was performed prior to the initiation of the procedure. The right groin was prepped and  draped in the usual sterile fashion. Thereafter using modified Seldinger technique, transfemoral access into the right common femoral artery was obtained without difficulty. Over an 0.035 inch guidewire, a 5 French Pinnacle sheath was inserted. Through this, and also over an 0.035 inch guidewire, a 5 [place] JB 1 catheter was advanced to the aortic arch region and selectively positioned in the right common carotid artery, the left common carotid artery and the right vertebral artery. FINDINGS: The innominate arteriogram demonstrates the proximal right subclavian artery and the right common carotid artery to be widely patent. The right vertebral artery origin is widely patent. The vessel is seen to opacify to the cranial skull base to the right posterior-inferior cerebellar artery. Distal flow is demonstrated in the right vertebrobasilar junction. The right common carotid arteriogram demonstrates the right external carotid artery and its major branches to be widely patent. The right internal carotid artery at the bulb to the cranial skull base is widely patent. The petrous, the cavernous and the supraclinoid segments demonstrate wide patency. The right middle cerebral artery and the right anterior cerebral artery opacify into the capillary and venous phases. No angiographic evidence of intraluminal filling defects or of occlusions is demonstrated. Focal moderate narrowing is seen at the origin of a branch in the distal M2 region. The left common carotid arteriogram demonstrates the left external carotid artery and its major branches to be widely patent. The left internal carotid artery at the bulb to the cranial skull base demonstrates wide patency. The petrous, the cavernous and the supraclinoid segments demonstrate wide patency. The left middle cerebral artery and the left anterior cerebral artery opacify into the capillary and venous phases. IMPRESSION: Angiographically moderate stenosis noted of a mid parietal  branch arising from the M2 M3 region of the inferior division of the left middle cerebral artery. No evidence of intraluminal filling defects noted. PLAN: As per referring MD. Electronically Signed   By: Luanne Bras M.D.   On: 03/27/2022 10:35   MR BRAIN WO CONTRAST  Result Date: 03/26/2022 CLINICAL DATA:  Slurred speech, falls, left-sided weakness EXAM: MRI HEAD WITHOUT CONTRAST TECHNIQUE: Multiplanar, multiecho pulse sequences of the brain and surrounding structures were obtained without intravenous contrast. COMPARISON:  03/25/2022 FINDINGS: Brain: New punctate foci of restricted diffusion with ADC correlates in the right frontal lobe cortex (series 2, image 40), left posterior frontal lobe cortex (series 2, image 42), and right occipital lobe cortex (series 2, image 26). The previously noted focus of mildly increased signal on diffusion-weighted imaging in the right corona radiata is no longer seen. No acute hemorrhage, mass, mass effect, or midline shift. No hemosiderin deposition to suggest remote  hemorrhage. No hydrocephalus or extra-axial collection. Vascular: Normal arterial flow voids. Previously noted FLAIR signal abnormality in the right MCA branches is no longer seen. Skull and upper cervical spine: Normal marrow signal. Sinuses/Orbits: Mucosal thickening in the maxillary sinuses. Otherwise negative. Other: The mastoids are well aerated. IMPRESSION: New punctate acute infarcts in the right frontal, left posterior frontal, and right occipital lobe cortex. Electronically Signed   By: Merilyn Baba M.D.   On: 03/26/2022 19:41   ECHOCARDIOGRAM COMPLETE  Result Date: 03/26/2022    ECHOCARDIOGRAM REPORT   Patient Name:   Alexander Simmons Date of Exam: 03/26/2022 Medical Rec #:  [id number]      Height:       61.0 in Accession #:    [id number]     Weight:       147.6 lb Date of Birth:  May 08, 1956       BSA:          1.660 m Patient Age:    75 years       BP:           136/86 mmHg Patient Gender: M               HR:           95 bpm. Exam Location:  Inpatient Procedure: 2D Echo, Cardiac Doppler and Color Doppler Indications:    Stroke  History:        Patient has prior history of Echocardiogram examinations, most                 recent 03/22/2014. Risk Factors:Hypertension.  Sonographer:    Jefferey Pica Referring Phys: [id number] [place]  1. Left ventricular ejection fraction, by estimation, is 55 to 60%. The left ventricle has normal function. The left ventricle has no regional wall motion abnormalities. Left ventricular diastolic parameters were normal.  2. Right ventricular systolic function is mildly reduced. The right ventricular size is normal. There is severely elevated pulmonary artery systolic pressure.  3. Left atrial size was moderately dilated.  4. Right atrial size was severely dilated.  5. The aortic valve is tricuspid. Aortic valve regurgitation is not visualized. No aortic stenosis is present.  6. A small pericardial effusion is present. The pericardial effusion is posterior to the left ventricle. There is no evidence of cardiac tamponade.  7. The mitral valve is normal in structure. Mild mitral valve regurgitation. No evidence of mitral stenosis.  8. Tricuspid valve regurgitation is moderate, there are multiple jets.  9. The inferior vena cava is dilated in size with <50% respiratory variability, suggesting right atrial pressure of 15 mmHg. Comparison(s): Tricuspid regurgitation increase from 2015 report. FINDINGS  Left Ventricle: Left ventricular ejection fraction, by estimation, is 55 to 60%. The left ventricle has normal function. The left ventricle has no regional wall motion abnormalities. The left ventricular internal cavity size was normal in size. There is  no left ventricular hypertrophy. Left ventricular diastolic parameters were normal. Right Ventricle: The right ventricular size is normal. No increase in right ventricular wall thickness. Right ventricular systolic  function is mildly reduced. There is severely elevated pulmonary artery systolic pressure. The tricuspid regurgitant velocity is 4.19 m/s, and with an assumed right atrial pressure of 15 mmHg, the estimated right ventricular systolic pressure is 93.7 mmHg. Left Atrium: Left atrial size was moderately dilated. Right Atrium: Right atrial size was severely dilated. Pericardium: A small pericardial effusion is present. The pericardial effusion is posterior to the left  ventricle. There is no evidence of cardiac tamponade. Mitral Valve: The mitral valve is normal in structure. Mild mitral valve regurgitation. No evidence of mitral valve stenosis. Tricuspid Valve: The tricuspid valve is normal in structure. Tricuspid valve regurgitation is moderate . No evidence of tricuspid stenosis. Aortic Valve: The aortic valve is tricuspid. Aortic valve regurgitation is not visualized. No aortic stenosis is present. Aortic valve peak gradient measures 5.4 mmHg. Pulmonic Valve: The pulmonic valve was normal in structure. Pulmonic valve regurgitation is not visualized. No evidence of pulmonic stenosis. Aorta: The aortic root and ascending aorta are structurally normal, with no evidence of dilitation. Venous: The inferior vena cava is dilated in size with less than 50% respiratory variability, suggesting right atrial pressure of 15 mmHg. IAS/Shunts: No atrial level shunt detected by color flow Doppler.  LEFT VENTRICLE PLAX 2D LVIDd:         5.00 cm   Diastology LVIDs:         3.90 cm   LV e' lateral: 10.80 cm/s LV PW:         1.00 cm LV IVS:        0.90 cm LVOT diam:     2.10 cm LV SV:         52 LV SV Index:   32 LVOT Area:     3.46 cm  RIGHT VENTRICLE         IVC TAPSE (M-mode): 1.7 cm  IVC diam: 2.40 cm LEFT ATRIUM             Index        RIGHT ATRIUM           Index LA diam:        3.40 cm 2.05 cm/m   RA Area:     32.20 cm LA Vol (A2C):   46.1 ml 27.77 ml/m  RA Volume:   121.00 ml 72.89 ml/m LA Vol (A4C):   66.7 ml 40.18 ml/m  LA Biplane Vol: 59.9 ml 36.08 ml/m  AORTIC VALVE                 PULMONIC VALVE AV Area (Vmax): 3.03 cm     PV Vmax:       0.80 m/s AV Vmax:        116.50 cm/s  PV Peak grad:  2.5 mmHg AV Peak Grad:   5.4 mmHg LVOT Vmax:      102.00 cm/s LVOT Vmean:     57.500 cm/s LVOT VTI:       0.151 m  AORTA Ao Root diam: 3.40 cm Ao Asc diam:  3.00 cm MR Peak grad: 81.0 mmHg   TRICUSPID VALVE MR Vmax:      450.00 cm/s TR Peak grad:   70.2 mmHg                           TR Vmax:        419.00 cm/s                            SHUNTS                           Systemic VTI:  0.15 m                           Systemic Diam:  2.10 cm Rudean Haskell MD Electronically signed by Rudean Haskell MD Signature Date/Time: 03/26/2022/2:57:01 PM    Final    DG CHEST PORT 1 VIEW  Result Date: 03/26/2022 CLINICAL DATA:  [id number] stroke EXAM: PORTABLE CHEST 1 VIEW COMPARISON:  Jan 15, 2020 FINDINGS: The cardiomediastinal silhouette is unchanged and enlarged in contour.ETT tip terminates immediately above the carina. Small LEFT pleural effusion. LEFT basilar airspace opacity, nonspecific. No pneumothorax. Visualized abdomen is unremarkable. Sequela of prior fracture of the LEFT humerus. IMPRESSION: 1. ETT tip terminates immediately above the carina. Recommend retraction. 2. LEFT basilar airspace opacity, nonspecific with differential considerations including infection, atelectasis or aspiration. Small LEFT pleural effusion. Electronically Signed   By: Valentino Saxon M.D.   On: 03/26/2022 07:45   MR BRAIN WO CONTRAST  Addendum Date: 03/25/2022   ADDENDUM REPORT: 03/25/2022 20:00 ADDENDUM: CTA did show segmental occlusion or near occlusion within the inferior division of the MCA, but did not show any visible abnormality in the superior division. Electronically Signed   By: Nelson Chimes M.D.   On: 03/25/2022 20:00   Result Date: 03/25/2022 CLINICAL DATA:  Neuro deficit, acute, stroke suspected EXAM: MRI HEAD WITHOUT CONTRAST MRA  HEAD WITHOUT CONTRAST TECHNIQUE: Multiplanar, multi-echo pulse sequences of the brain and surrounding structures were acquired without intravenous contrast. Angiographic images of the Circle of Willis were acquired using MRA technique without intravenous contrast. COMPARISON:  CT angiography earlier same day FINDINGS: MRI HEAD FINDINGS Brain: No definite completed acute infarction. One could question a very small focus of restricted diffusion within the radiating white matter tracts of the corona radiata on the right. Also question small cortical infarction in the deep insula. Elsewhere, no focal abnormality affects the brainstem or cerebellum. No old brain insult. No hydrocephalus or extra-axial collection. Vascular: Increased FLAIR signal within MCA branches of the deep insula and right parietal region likely indicating slow flow. Skull and upper cervical spine: Negative Sinuses/Orbits: Mild mucosal inflammatory changes of the sinuses. Orbits negative. Other: None MRA HEAD FINDINGS Anterior circulation: Both internal carotid arteries are patent through the skull base and siphon regions. Left anterior and middle cerebral vessels are normal. Right anterior cerebral vessels are normal. Diminished visualization of more distal MCA branch vessels in both the inferior and superior divisions. Posterior circulation: Both vertebral arteries are patent to the basilar. No basilar stenosis. Superior cerebellar and posterior cerebral arteries show flow. More distal PCA branch vessels show atherosclerotic narrowing and irregularity. Anatomic variants: None significant. IMPRESSION: Subtle signs of early infarction affecting the right deep insula and a few foci of the white matter in the right corona radiata. No evidence of mass effect or hemorrhage. No evidence of old brain insult. Slow flow in the right MCA branches, particularly within the inferior division. MR angiography shows poor visualization of the distal MCA branches of  both the inferior and superior divisions, consistent with slow flow. Occlusion was not shown by CTA done 45 minutes ago. Electronically Signed: By: Nelson Chimes M.D. On: 03/25/2022 19:31   MR ANGIO HEAD WO CONTRAST  Addendum Date: 03/25/2022   ADDENDUM REPORT: 03/25/2022 20:00 ADDENDUM: CTA did show segmental occlusion or near occlusion within the inferior division of the MCA, but did not show any visible abnormality in the superior division. Electronically Signed   By: Nelson Chimes M.D.   On: 03/25/2022 20:00   Result Date: 03/25/2022 CLINICAL DATA:  Neuro deficit, acute, stroke suspected EXAM: MRI HEAD WITHOUT CONTRAST MRA HEAD WITHOUT CONTRAST TECHNIQUE: Multiplanar, multi-echo pulse  sequences of the brain and surrounding structures were acquired without intravenous contrast. Angiographic images of the Circle of Willis were acquired using MRA technique without intravenous contrast. COMPARISON:  CT angiography earlier same day FINDINGS: MRI HEAD FINDINGS Brain: No definite completed acute infarction. One could question a very small focus of restricted diffusion within the radiating white matter tracts of the corona radiata on the right. Also question small cortical infarction in the deep insula. Elsewhere, no focal abnormality affects the brainstem or cerebellum. No old brain insult. No hydrocephalus or extra-axial collection. Vascular: Increased FLAIR signal within MCA branches of the deep insula and right parietal region likely indicating slow flow. Skull and upper cervical spine: Negative Sinuses/Orbits: Mild mucosal inflammatory changes of the sinuses. Orbits negative. Other: None MRA HEAD FINDINGS Anterior circulation: Both internal carotid arteries are patent through the skull base and siphon regions. Left anterior and middle cerebral vessels are normal. Right anterior cerebral vessels are normal. Diminished visualization of more distal MCA branch vessels in both the inferior and superior divisions.  Posterior circulation: Both vertebral arteries are patent to the basilar. No basilar stenosis. Superior cerebellar and posterior cerebral arteries show flow. More distal PCA branch vessels show atherosclerotic narrowing and irregularity. Anatomic variants: None significant. IMPRESSION: Subtle signs of early infarction affecting the right deep insula and a few foci of the white matter in the right corona radiata. No evidence of mass effect or hemorrhage. No evidence of old brain insult. Slow flow in the right MCA branches, particularly within the inferior division. MR angiography shows poor visualization of the distal MCA branches of both the inferior and superior divisions, consistent with slow flow. Occlusion was not shown by CTA done 45 minutes ago. Electronically Signed: By: Nelson Chimes M.D. On: 03/25/2022 19:31   CT ANGIO HEAD NECK W WO CM W PERF (CODE STROKE)  Result Date: 03/25/2022 CLINICAL DATA:  Stroke suspected EXAM: CT ANGIOGRAPHY HEAD AND NECK CT PERFUSION BRAIN TECHNIQUE: Multidetector CT imaging of the head and neck was performed using the standard protocol during bolus administration of intravenous contrast. Multiplanar CT image reconstructions and MIPs were obtained to evaluate the vascular anatomy. Carotid stenosis measurements (when applicable) are obtained utilizing NASCET criteria, using the distal internal carotid diameter as the denominator. Multiphase CT imaging of the brain was performed following IV bolus contrast injection. Subsequent parametric perfusion maps were calculated using RAPID software. RADIATION DOSE REDUCTION: This exam was performed according to the departmental dose-optimization program which includes automated exposure control, adjustment of the mA and/or kV according to patient size and/or use of iterative reconstruction technique. CONTRAST:  123m OMNIPAQUE IOHEXOL 350 MG/ML SOLN COMPARISON:  CT head 03/25/2022 no prior CTA FINDINGS: CT HEAD FINDINGS For noncontrast  findings, please see same day CT head. CTA NECK FINDINGS Aortic arch: Standard branching. Imaged portion shows no evidence of aneurysm or dissection. No significant stenosis of the major arch vessel origins. Right carotid system: No evidence of dissection, occlusion, or hemodynamically significant stenosis (greater than 50%). Left carotid system: No evidence of dissection, occlusion, or hemodynamically significant stenosis (greater than 50%). Vertebral arteries: Left dominant. No evidence of dissection, occlusion, or hemodynamically significant stenosis (greater than 50%). Skeleton: No acute osseous abnormality. Other neck: None. Upper chest: No focal pulmonary opacity or pleural effusion. Review of the MIP images confirms the above findings CTA HEAD FINDINGS Anterior circulation: Both internal carotid arteries are patent to the termini, without significant stenosis. A1 segments patent. Normal anterior communicating artery. Anterior cerebral arteries are patent  to their distal aspects. No M1 stenosis or occlusion. Short-segment of a right M2 branch (series 6, images 1 100-101) with distal reconstitution (series 6, image 95), although the remainder of the vessel is somewhat poorly opacified (series 6, image 91). MCA branches otherwise well perfused and symmetric. Posterior circulation: Vertebral arteries patent to the vertebrobasilar junction without stenosis. Posterior inferior cerebellar arteries patent proximally. Basilar patent to its distal aspect. Superior cerebellar arteries patent proximally. Patent P1 segments. PCAs perfused to their distal aspects without stenosis. The bilateral posterior communicating arteries are patent. Venous sinuses: As permitted by contrast timing, patent. Anatomic variants: None significant. Review of the MIP images confirms the above findings CT Brain Perfusion Findings: ASPECTS: 10 CBF (<30%) Volume: 99m Perfusion (Tmax>6.0s) volume: 554mMismatch Volume: 5355mnfarction  Location:No infarct core, penumbra in the posterior left MCA territory IMPRESSION: 1. Occlusion of a right M2 branch, with subsequent reconstitution, although downstream opacification is diminished. 2. No infarct core. Area of decreased perfusion (penumbra) in the posterior right MCA territory measures 53 mL. 3. No other intracranial large vessel occlusion or significant stenosis. 4. No hemodynamically significant stenosis in the neck. These findings were discussed by telephone on 03/25/2022 at 7:00 pm with provider SRISHTI BHAGAT . Electronically Signed   By: AliMerilyn BabaD.   On: 03/25/2022 19:08   CT Cervical Spine Wo Contrast  Result Date: 03/25/2022 CLINICAL DATA:  Fall with neck pain EXAM: CT CERVICAL SPINE WITHOUT CONTRAST TECHNIQUE: Multidetector CT imaging of the cervical spine was performed without intravenous contrast. Multiplanar CT image reconstructions were also generated. RADIATION DOSE REDUCTION: This exam was performed according to the departmental dose-optimization program which includes automated exposure control, adjustment of the mA and/or kV according to patient size and/or use of iterative reconstruction technique. COMPARISON:  None Available. FINDINGS: Alignment: Straightening of the cervical spine. No subluxation. Facet alignment within normal limits Skull base and vertebrae: No acute fracture. No primary bone lesion or focal pathologic process. Soft tissues and spinal canal: No prevertebral fluid or swelling. No visible canal hematoma. Disc levels: Mild multilevel degenerative change. Facet degenerative changes at multiple levels Upper chest: Negative. Other: None IMPRESSION: Straightening of the cervical spine.  No acute osseous abnormality Electronically Signed   By: KimDonavan FoilD.   On: 03/25/2022 18:50   CT HEAD CODE STROKE WO CONTRAST  Result Date: 03/25/2022 CLINICAL DATA:  Code stroke. EXAM: CT HEAD WITHOUT CONTRAST TECHNIQUE: Contiguous axial images were obtained from  the base of the skull through the vertex without intravenous contrast. RADIATION DOSE REDUCTION: This exam was performed according to the departmental dose-optimization program which includes automated exposure control, adjustment of the mA and/or kV according to patient size and/or use of iterative reconstruction technique. COMPARISON:  01/09/2010 FINDINGS: Brain: No evidence of acute infarction, hemorrhage, cerebral edema, mass, mass effect, or midline shift. No hydrocephalus or extra-axial collection. Vascular: No hyperdense vessel. Skull: Negative for fracture or focal lesion. Sinuses/Orbits: No acute finding. Other: The mastoid air cells are well aerated. ASPECTS ([facility] Early [facility] Score) - Ganglionic level infarction (caudate, lentiform nuclei, internal capsule, insula, M1-M3 cortex): 7 - Supraganglionic infarction (M4-M6 cortex): 3 Total score (0-10 with 10 being normal): 10 IMPRESSION: 1. No acute intracranial process. 2. ASPECTS is 10 Code stroke imaging results were communicated on 03/25/2022 at 6:45 pm to provider ZAMMIT via telephone, who verbally acknowledged these results. Electronically Signed   By: AliMerilyn BabaD.   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 The patient's score is based upon: CHF History: 0 HTN History: 1 Diabetes History: 0 Stroke History: 2 Vascular Disease History: 0 Age Score: 1 Gender Score: 0    For questions or updates, please contact [place] Please consult [email or web address] for contact info under    Signed, Margie Billet, NP  03/27/2022 11:49 AM  Patient seen and examined with Margie Billet NP.  Agree as above, with the following exceptions and changes as noted below. 66 yo male with stroke on whom we are asked to comment on anticoagulation. Patient has no acute concerns, we discussed indications for anticoagulation. Gen: NAD, CV: iRRR, no murmurs, Lungs: clear, Abd: soft, Extrem: Warm, well perfused, no edema, Neuro/Psych: alert and oriented x 3, normal mood and affect. All available labs, radiology testing, previous records reviewed. When appropriate from neuro/stroke standpoint, would agree with initiating anticoagulation for CHADS2VASC score of 4. Pt has a home prescription for metoprolol, though reported as not taking. Rates relatively well controlled currently, however could resume this script if needed for initial rate control. If rates prove difficult to control, or if they are a barrier to adequate therapy, we will consider TEE cardioversion.   Elouise Munroe, MD

## 2022-03-27 NOTE — Progress Notes (Signed)
   Inpatient Rehabilitation Admissions Coordinator   Met with patient at bedside for rehab assessment. We discussed goals and expectations of a possible CIR admit. I also discussed current masking and visitor guidelines due to [place] concerns. He prefers CIR for rehab. Aunt can provide supervision  that is recommended. I will begin insurance Auth with [facility]/[employer] for possible CIR admit pending approval. Please call me with any questions.   Danne Baxter, RN, MSN Rehab Admissions Coordinator [phone]

## 2022-03-27 NOTE — TOC CAGE-AID Note (Signed)
Transition of Care [facility]) - CAGE-AID Screening   Patient Details  Name: Alexander Simmons MRN: [id number] Date of Birth: June 10, 1956  Transition of [facility] CM/SW Contact:    Benard Halsted, LCSW Phone Number: 03/27/2022, 11:54 AM   Clinical Narrative: Patient is currently disoriented and unable to participate in screening at this time.    CAGE-AID Screening: Substance Abuse Screening unable to be completed due to: : Patient unable to participate

## 2022-03-27 NOTE — TOC Benefit Eligibility Note (Signed)
Patient Advocate Encounter  Insurance verification completed.    The patient is currently admitted and upon discharge could be taking Eliquis 5 mg.  The current 30 day co-pay is $0.00.   The patient is currently admitted and upon discharge could be taking Xarelto 20 mg.  The current 30 day co-pay is $0.00.   The patient is insured through [employer] Medicare Part D     Crystallee Werden, CPhT Pharmacy Patient Advocate Specialist [facility] Patient Advocate Team Direct Number: [phone]  Fax: [phone]        

## 2022-03-28 ENCOUNTER — Inpatient Hospital Stay (HOSPITAL_COMMUNITY)
Admission: RE | Admit: 2022-03-28 | Discharge: 2022-04-02 | DRG: 057 | Disposition: A | Discharge status: Home / Self Care | Payer: 59 | Source: Intra-hospital | Attending: Physical Medicine & Rehabilitation | Admitting: Physical Medicine & Rehabilitation

## 2022-03-28 ENCOUNTER — Encounter (HOSPITAL_COMMUNITY): Payer: Self-pay | Admitting: Neurology

## 2022-03-28 ENCOUNTER — Encounter (HOSPITAL_COMMUNITY): Payer: Self-pay | Admitting: Physical Medicine & Rehabilitation

## 2022-03-28 DIAGNOSIS — R296 Repeated falls: Secondary | ICD-10-CM | POA: Diagnosis present

## 2022-03-28 DIAGNOSIS — S00532D Contusion of oral cavity, subsequent encounter: Secondary | ICD-10-CM

## 2022-03-28 DIAGNOSIS — I69398 Other sequelae of cerebral infarction: Secondary | ICD-10-CM | POA: Diagnosis present

## 2022-03-28 DIAGNOSIS — N179 Acute kidney failure, unspecified: Secondary | ICD-10-CM | POA: Diagnosis present

## 2022-03-28 DIAGNOSIS — I426 Alcoholic cardiomyopathy: Secondary | ICD-10-CM | POA: Diagnosis present

## 2022-03-28 DIAGNOSIS — I69322 Dysarthria following cerebral infarction: Secondary | ICD-10-CM

## 2022-03-28 DIAGNOSIS — E875 Hyperkalemia: Secondary | ICD-10-CM | POA: Diagnosis not present

## 2022-03-28 DIAGNOSIS — Z720 Tobacco use: Secondary | ICD-10-CM

## 2022-03-28 DIAGNOSIS — I63511 Cerebral infarction due to unspecified occlusion or stenosis of right middle cerebral artery: Secondary | ICD-10-CM

## 2022-03-28 DIAGNOSIS — E559 Vitamin D deficiency, unspecified: Secondary | ICD-10-CM | POA: Diagnosis present

## 2022-03-28 DIAGNOSIS — R2689 Other abnormalities of gait and mobility: Secondary | ICD-10-CM | POA: Diagnosis present

## 2022-03-28 DIAGNOSIS — I69319 Unspecified symptoms and signs involving cognitive functions following cerebral infarction: Secondary | ICD-10-CM | POA: Diagnosis not present

## 2022-03-28 DIAGNOSIS — F1010 Alcohol abuse, uncomplicated: Secondary | ICD-10-CM | POA: Diagnosis present

## 2022-03-28 DIAGNOSIS — I4891 Unspecified atrial fibrillation: Secondary | ICD-10-CM | POA: Diagnosis present

## 2022-03-28 DIAGNOSIS — I639 Cerebral infarction, unspecified: Secondary | ICD-10-CM | POA: Diagnosis present

## 2022-03-28 DIAGNOSIS — D72829 Elevated white blood cell count, unspecified: Secondary | ICD-10-CM | POA: Diagnosis present

## 2022-03-28 DIAGNOSIS — D6832 Hemorrhagic disorder due to extrinsic circulating anticoagulants: Secondary | ICD-10-CM | POA: Diagnosis not present

## 2022-03-28 DIAGNOSIS — T45515A Adverse effect of anticoagulants, initial encounter: Secondary | ICD-10-CM | POA: Diagnosis not present

## 2022-03-28 DIAGNOSIS — E538 Deficiency of other specified B group vitamins: Secondary | ICD-10-CM | POA: Diagnosis present

## 2022-03-28 DIAGNOSIS — I10 Essential (primary) hypertension: Secondary | ICD-10-CM | POA: Diagnosis present

## 2022-03-28 LAB — TRIGLYCERIDES: Triglycerides: 60 mg/dL (ref <150)

## 2022-03-28 LAB — METHYLMALONIC ACID, SERUM: Methylmalonic Acid, Quantitative: 144 nmol/L (ref 0–378)

## 2022-03-28 MED ORDER — PROCHLORPERAZINE EDISYLATE 10 MG/2ML IJ SOLN: 5.0000 mg | Freq: Four times a day (QID) | INTRAMUSCULAR | Status: DC | PRN

## 2022-03-28 MED ORDER — ORAL CARE MOUTH RINSE: 15.0000 mL | OROMUCOSAL | Status: DC | PRN

## 2022-03-28 MED ORDER — VITAMIN B-12 1000 MCG PO TABS
500.0000 ug | ORAL_TABLET | Freq: Every day | ORAL | Status: DC
  Administered 2022-03-29 – 2022-04-02 (×5): 500 ug via ORAL
  Filled 2022-03-28 (×5): qty 1

## 2022-03-28 MED ORDER — SENNOSIDES-DOCUSATE SODIUM 8.6-50 MG PO TABS: 1.0000 | ORAL_TABLET | Freq: Every evening | ORAL | Status: DC | PRN

## 2022-03-28 MED ORDER — FOLIC ACID 1 MG PO TABS
1.0000 mg | ORAL_TABLET | Freq: Every day | ORAL | Status: DC
  Administered 2022-03-29 – 2022-04-02 (×5): 1 mg via ORAL
  Filled 2022-03-28 (×5): qty 1

## 2022-03-28 MED ORDER — FLEET ENEMA 7-19 GM/118ML RE ENEM: 1.0000 | ENEMA | Freq: Once | RECTAL | Status: DC | PRN

## 2022-03-28 MED ORDER — CYANOCOBALAMIN 1000 MCG/ML IJ SOLN
1000.0000 ug | Freq: Once | INTRAMUSCULAR | Status: AC
  Administered 2022-03-28: 1000 ug via INTRAMUSCULAR
  Filled 2022-03-28: qty 1

## 2022-03-28 MED ORDER — THIAMINE HCL 100 MG/ML IJ SOLN: 100.0000 mg | Freq: Every day | INTRAMUSCULAR | Status: DC

## 2022-03-28 MED ORDER — APIXABAN 5 MG PO TABS
5.0000 mg | ORAL_TABLET | Freq: Two times a day (BID) | ORAL | Status: DC
  Administered 2022-03-28 – 2022-04-02 (×10): 5 mg via ORAL
  Filled 2022-03-28 (×10): qty 1

## 2022-03-28 MED ORDER — CALCIUM CITRATE 950 (200 CA) MG PO TABS
200.0000 mg | ORAL_TABLET | Freq: Two times a day (BID) | ORAL | Status: DC
  Administered 2022-03-28 – 2022-04-01 (×9): 200 mg via ORAL
  Filled 2022-03-28 (×11): qty 1

## 2022-03-28 MED ORDER — THIAMINE HCL 100 MG PO TABS
100.0000 mg | ORAL_TABLET | Freq: Every day | ORAL | Status: DC
  Administered 2022-03-29 – 2022-04-02 (×5): 100 mg via ORAL
  Filled 2022-03-28 (×5): qty 1

## 2022-03-28 MED ORDER — APIXABAN 5 MG PO TABS: 5.0000 mg | ORAL_TABLET | Freq: Two times a day (BID) | ORAL | 1 refills | Status: DC

## 2022-03-28 MED ORDER — PROCHLORPERAZINE 25 MG RE SUPP: 12.5000 mg | Freq: Four times a day (QID) | RECTAL | Status: DC | PRN

## 2022-03-28 MED ORDER — GUAIFENESIN-DM 100-10 MG/5ML PO SYRP: 5.0000 mL | ORAL_SOLUTION | Freq: Four times a day (QID) | ORAL | Status: DC | PRN

## 2022-03-28 MED ORDER — TRAZODONE HCL 50 MG PO TABS
25.0000 mg | ORAL_TABLET | Freq: Every evening | ORAL | Status: DC | PRN
  Filled 2022-03-28: qty 1

## 2022-03-28 MED ORDER — VITAMIN D (ERGOCALCIFEROL) 1.25 MG (50000 UNIT) PO CAPS
50000.0000 [IU] | ORAL_CAPSULE | ORAL | Status: DC
  Administered 2022-03-28: 50000 [IU] via ORAL
  Filled 2022-03-28: qty 1

## 2022-03-28 MED ORDER — PROCHLORPERAZINE MALEATE 5 MG PO TABS: 5.0000 mg | ORAL_TABLET | Freq: Four times a day (QID) | ORAL | Status: DC | PRN

## 2022-03-28 MED ORDER — BISACODYL 10 MG RE SUPP: 10.0000 mg | Freq: Every day | RECTAL | Status: DC | PRN

## 2022-03-28 MED ORDER — ACETAMINOPHEN 325 MG PO TABS
325.0000 mg | ORAL_TABLET | ORAL | Status: DC | PRN
  Administered 2022-03-29: 650 mg via ORAL
  Filled 2022-03-28 (×2): qty 2

## 2022-03-28 MED ORDER — POLYETHYLENE GLYCOL 3350 17 G PO PACK: 17.0000 g | PACK | Freq: Every day | ORAL | Status: DC | PRN

## 2022-03-28 MED ORDER — ALUM & MAG HYDROXIDE-SIMETH 200-200-20 MG/5ML PO SUSP
30.0000 mL | ORAL | Status: DC | PRN
Start: 2022-03-28 — End: 2022-04-02
  Administered 2022-03-29: 30 mL via ORAL
  Filled 2022-03-28: qty 30

## 2022-03-28 MED ORDER — DIPHENHYDRAMINE HCL 12.5 MG/5ML PO ELIX: 12.5000 mg | ORAL_SOLUTION | Freq: Four times a day (QID) | ORAL | Status: DC | PRN

## 2022-03-28 MED ORDER — PANTOPRAZOLE SODIUM 40 MG PO TBEC
40.0000 mg | DELAYED_RELEASE_TABLET | Freq: Every day | ORAL | Status: DC
  Administered 2022-03-29 – 2022-04-02 (×5): 40 mg via ORAL
  Filled 2022-03-28 (×5): qty 1

## 2022-03-28 MED ORDER — ADULT MULTIVITAMIN W/MINERALS CH
1.0000 | ORAL_TABLET | Freq: Every day | ORAL | Status: DC
  Administered 2022-03-29 – 2022-04-02 (×5): 1 via ORAL
  Filled 2022-03-28 (×5): qty 1

## 2022-03-28 NOTE — H&P (Signed)
Physical Medicine and Rehabilitation Admission H&P        Chief Complaint  Patient presents with   Stroke with functional deficits        HPI: Alexander Simmons is a 66 year old male with history of HTN, glaucoma, cor pulmonale, tobacco and ETOH abus, learning disability(?);  who was evaluated at [facility] on 03/24/22 with reports of fall while taking out trash in am followed by slurred, left facial droop, multiple falls with onset of vomiting since the last fall.  UDS negative. ETOH level 181. CT head negative and CTA w/P head showed occlusion of right M2 branch with decreased area of perfusion in posterior R-MCA 53 ml and he received TNK. He ws starated on IV thiamine and CIWA protocol and transferred to [facility] 7/31 for intervention. He underwent cerebral angio with was negative for filling defects or occlusions. with MRI/MRA brain done revealing early infarct right deep insula  and corona radiata as well as poor visualization of distal MCA branches with slow flow but no occlusion.  He was extubated without difficulty.    Repeat MRI/MRA brain 08/01 done revealing   new punctate infarcts in right frontal, left posterior frontal and right occipital lobe.  2D echo showed EF 55-60% with no aortic stenosis and moderate TVR. He was found to have A fib on 08/02 and Dr. [name] consulted for input. Cards reported patient with history of palpitations in the past with work up revealing revealing  PSVT and alcoholic CM and on BB without cardioembolic stroke. TSH WNL and she recommended resuming metoprolol in needed for rate control. ST evaluation revealed severe cognitive communication deficits with mild dysarthria and SLUMS score 15/30. PT/OT evals done revealing weakness, balance deficits with posterior leanas well as dizziness affecting ADLs and mobility. CIR recommended due to functional decline.        Review of Systems  Constitutional:  Negative for chills and fever.  HENT:  Negative for hearing loss and  tinnitus.   Eyes:  Negative for blurred vision.  Respiratory:  Negative for cough and hemoptysis.   Cardiovascular:  Negative for chest pain and palpitations.  Gastrointestinal:  Negative for abdominal pain, constipation and nausea.  Genitourinary:  Negative for dysuria and urgency.  Musculoskeletal:  Negative for myalgias and neck pain.  Skin:  Negative for rash.  Neurological:  Positive for weakness. Negative for dizziness and headaches.  Psychiatric/Behavioral:  The patient does not have insomnia.             Past Medical History:  Diagnosis Date   Alcohol abuse     Allergy     Cor pulmonale (HCC)      By echocardiography   Glaucoma     Hyperlipidemia     Hypertension     Small bowel obstruction ([place])     Syncope 12/2009    Attributed to use of narcotics plus alcohol   Tobacco abuse             Past Surgical History:  Procedure Laterality Date   ABDOMINAL SURGERY       COLONOSCOPY N/A 01/28/2013    Procedure: COLONOSCOPY;  Surgeon: Rogene Houston, MD;  Location: [facility];  Service: Endoscopy;  Laterality: N/A;  1225   IR ANGIO INTRA EXTRACRAN SEL COM CAROTID INNOMINATE BILAT MOD SED   03/25/2022   IR ANGIO VERTEBRAL SEL SUBCLAVIAN INNOMINATE UNI R MOD SED   03/27/2022   PILONIDAL CYST EXCISION   2005  Dr. Irving Shows   RADIOLOGY WITH ANESTHESIA N/A 03/25/2022    Procedure: IR WITH ANESTHESIA;  Surgeon: Radiologist, Medication, MD;  Location: [place];  Service: Radiology;  Laterality: N/A;   TOOTH EXTRACTION               Family History  Problem Relation Age of Onset   Other Other          FH unknown-patient raised in foster care system   Colon cancer Neg Hx        Social History: Lives with aunt (is 73 years old and falls frequently) and is her caregiver. Never married and has family in town.  Disabled due learning disability?psych issues? He  reports that he has never smoked. His smokeless tobacco use includes snuff and chew since age 63. He  Per reports current  alcohol use of about 1/2 cup of gin couple of times a week. He does not use drugs.     Allergies: No Known Allergies           Medications Prior to Admission  Medication Sig Dispense Refill   amLODipine (NORVASC) 5 MG tablet Take 1 tablet (5 mg total) by mouth daily. 90 tablet 1   aspirin 81 MG chewable tablet Chew 81 mg by mouth daily.       ibuprofen (ADVIL) 800 MG tablet Take 800 mg by mouth every 8 (eight) hours as needed.       PILOCARPINE-EPINEPHRINE OP Apply 1 drop to eye daily.       amoxicillin (AMOXIL) 500 MG capsule Take 500 mg by mouth 3 (three) times daily. (Patient not taking: Reported on 03/26/2022)       metoprolol tartrate (LOPRESSOR) 25 MG tablet Take 1 tablet (25 mg total) by mouth 2 (two) times daily. (Patient not taking: Reported on 03/26/2022) 180 tablet 1          Home: Home Living Family/patient expects to be discharged to:: Private residence Living Arrangements:  (lives with "cousin" [place]) Available Help at Discharge: Other (Comment) Type of Home: House Home Access: Stairs to enter [employer] of Steps: 4 Entrance Stairs-Rails: Right, Left Home Layout: One level Bathroom Shower/Tub: Multimedia programmer: Handicapped height Bathroom Accessibility: Yes Home Equipment: None  Lives With: Other (Comment)   Functional History: Prior Function Prior Level of Function : Independent/Modified Independent Mobility Comments: no AD; pt could ambulate in community ADLs Comments: He lives with his "cousin" [place]. He asissts with household management   Functional Status:  Mobility: Bed Mobility Overal bed mobility: Needs Assistance Bed Mobility: Supine to Sit, Sit to Supine Supine to sit: Min guard Sit to supine: Supervision General bed mobility comments: increased time, use of rail, cues to scoot forward Transfers Overall transfer level: Needs assistance Equipment used: Rolling walker (2 wheels) Transfers: Sit to/from Stand Sit to  Stand: Min assist General transfer comment: Pt with multiple LOB upon standing requiring min A to steady; Performed x 4 Ambulation/Gait Ambulation/Gait assistance: Min assist Gait Distance (Feet): 100 Feet Assistive device: Rolling walker (2 wheels) Gait Pattern/deviations: Step-through pattern, Decreased stride length General Gait Details: Pt initially unsteady with 4 LOB within 40' requiring min A to recover, did improve with last 60'.  Cues for RW use. Gait velocity: decreased   ADL: ADL Overall ADL's : Needs assistance/impaired Eating/Feeding: Independent, Sitting Grooming: Set up, Sitting Upper Body Bathing: Supervision/ safety, Sitting Lower Body Bathing: Moderate assistance, Sit to/from stand Upper Body Dressing : Supervision/safety, Sitting Lower Body Dressing: Moderate  assistance, Sit to/from stand Lower Body Dressing Details (indicate cue type and reason): need assist for socks at EOB - pt states he is usually able to don socks indep Toilet Transfer: Minimal assistance, Ambulation, Regular Toilet Toileting- Clothing Manipulation and Hygiene: Supervision/safety, Sitting/lateral lean Functional mobility during ADLs: Minimal assistance General ADL Comments: slow and deliberate with impaire balance, and impaired cognition. Incontinent urine this date   Cognition: Cognition Overall Cognitive Status: Impaired/Different from baseline Arousal/Alertness: Awake/alert Orientation Level: Oriented X4 Year:  (2002) Month: August Day of Week: Correct Attention: Sustained Sustained Attention: Appears intact Memory: Impaired Memory Impairment: Storage deficit, Retrieval deficit, Decreased recall of new information Awareness: Impaired Awareness Impairment: Intellectual impairment Problem Solving: Impaired Problem Solving Impairment: Functional basic Executive Function: Organizing, Self Monitoring, Self Correcting, Sequencing Sequencing: Impaired Sequencing Impairment: Verbal  basic Organizing: Impaired Organizing Impairment: Functional basic Self Monitoring: Impaired Self Monitoring Impairment: Functional basic Self Correcting: Impaired Self Correcting Impairment: Functional basic Cognition Arousal/Alertness: Awake/alert Behavior During Therapy: WFL for tasks assessed/performed Overall Cognitive Status: Impaired/Different from baseline Area of Impairment: Memory, Following commands, Safety/judgement, Awareness, Problem solving Orientation Level: Disoriented to, Time, Situation Memory: Decreased short-term memory Following Commands: Follows one step commands consistently, Follows multi-step commands inconsistently Safety/Judgement: Decreased awareness of deficits Awareness: Emergent Problem Solving: Requires verbal cues, Requires tactile cues General Comments: Pt A and O x 4 today but little recall; unable to count up by 3; for 3 animals with "c" said cat, rabbit, dog;     Blood pressure (!) 141/93, pulse 92, temperature 97.8 F (36.6 C), temperature source Oral, resp. rate (!) 25, height '5\' 1"'$  (1.549 m), weight 67 kg, SpO2 100 %. Physical Exam Vitals and nursing note reviewed.  Constitutional:      General: He is not in acute distress.    Appearance: Normal appearance. He is not ill-appearing.  HENT:     Head: Normocephalic.     Right Ear: External ear normal.     Left Ear: External ear normal.     Mouth/Throat:     Mouth: Mucous membranes are moist.     Comments: Poor dentition Eyes:     Conjunctiva/sclera: Conjunctivae normal.  Cardiovascular:     Rate and Rhythm: Normal rate and regular rhythm.  Pulmonary:     Effort: Pulmonary effort is normal. No respiratory distress.     Breath sounds: Normal breath sounds. No wheezing or rales.  Abdominal:     General: Bowel sounds are normal. There is no distension.     Palpations: Abdomen is soft.     Tenderness: There is no abdominal tenderness.  Musculoskeletal:        General: No swelling or  tenderness.     Cervical back: Normal range of motion.  Skin:    Comments: Multiple lesions in different stages of healing on BUE and BLE with scars from prior bites?.   Neurological:     Mental Status: He is alert and oriented to person, place, and time.     Comments: Mild dysarthria. Alert and appropriate. He was able to recall day, date, month and follow simple motor commands without difficulty. Reasonable insight and awareness. Left central 7 and mild tongue deviation. RUE and RLE 5/5. LUE 4/5. LLE 4- to 4/5. No focal sensory deficits. DTR's 1+. No cerebellar signs but decreased [place] LUE, +PD.  Psychiatric:        Mood and Affect: Mood normal.        Behavior: Behavior normal.  Lab Results Last 48 Hours        Results for orders placed or performed during the hospital encounter of 03/25/22 (from the past 48 hour(s))  LDL cholesterol, direct     Status: None    Collection Time: 03/27/22  6:41 AM  Result Value Ref Range    Direct LDL 30 0 - 99 mg/dL      Comment: Performed at [facility], [address], [place], [place] [address]  TSH     Status: None    Collection Time: 03/27/22  6:41 AM  Result Value Ref Range    TSH 1.529 0.350 - 4.500 uIU/mL      Comment: Performed by a 3rd Generation assay with a functional sensitivity of <=0.01 uIU/mL. Performed at [facility], [place] [address] [place], [place] [address]    Triglycerides     Status: None    Collection Time: 03/28/22  2:15 AM  Result Value Ref Range    Triglycerides 60 <150 mg/dL      Comment: Performed at [facility] [address] [place], [place] [address]       Imaging Results (Last 48 hours)  MR BRAIN WO CONTRAST   Result Date: 03/26/2022 CLINICAL DATA:  Slurred speech, falls, left-sided weakness EXAM: MRI HEAD WITHOUT CONTRAST TECHNIQUE: Multiplanar, multiecho pulse sequences of the brain and surrounding structures were obtained without intravenous contrast. COMPARISON:  03/25/2022  FINDINGS: Brain: New punctate foci of restricted diffusion with ADC correlates in the right frontal lobe cortex (series 2, image 40), left posterior frontal lobe cortex (series 2, image 42), and right occipital lobe cortex (series 2, image 26). The previously noted focus of mildly increased signal on diffusion-weighted imaging in the right corona radiata is no longer seen. No acute hemorrhage, mass, mass effect, or midline shift. No hemosiderin deposition to suggest remote hemorrhage. No hydrocephalus or extra-axial collection. Vascular: Normal arterial flow voids. Previously noted FLAIR signal abnormality in the right MCA branches is no longer seen. Skull and upper cervical spine: Normal marrow signal. Sinuses/Orbits: Mucosal thickening in the maxillary sinuses. Otherwise negative. Other: The mastoids are well aerated. IMPRESSION: New punctate acute infarcts in the right frontal, left posterior frontal, and right occipital lobe cortex. Electronically Signed   By: Merilyn Baba M.D.   On: 03/26/2022 19:41    ECHOCARDIOGRAM COMPLETE   Result Date: 03/26/2022    ECHOCARDIOGRAM REPORT   Patient Name:   Alexander Simmons Date of Exam: 03/26/2022 Medical Rec #:  [id number]      Height:       61.0 in Accession #:    [id number]     Weight:       147.6 lb Date of Birth:  Aug 17, 1956       BSA:          1.660 m Patient Age:    15 years       BP:           136/86 mmHg Patient Gender: M              HR:           95 bpm. Exam Location:  Inpatient Procedure: 2D Echo, Cardiac Doppler and Color Doppler Indications:    Stroke  History:        Patient has prior history of Echocardiogram examinations, most                 recent 03/22/2014. Risk Factors:Hypertension.  Sonographer:    Jefferey Pica Referring Phys: [id number] [place]  1. Left ventricular ejection fraction, by estimation, is 55 to 60%. The left ventricle has normal function. The left ventricle has no regional wall motion abnormalities. Left ventricular  diastolic parameters were normal.  2. Right ventricular systolic function is mildly reduced. The right ventricular size is normal. There is severely elevated pulmonary artery systolic pressure.  3. Left atrial size was moderately dilated.  4. Right atrial size was severely dilated.  5. The aortic valve is tricuspid. Aortic valve regurgitation is not visualized. No aortic stenosis is present.  6. A small pericardial effusion is present. The pericardial effusion is posterior to the left ventricle. There is no evidence of cardiac tamponade.  7. The mitral valve is normal in structure. Mild mitral valve regurgitation. No evidence of mitral stenosis.  8. Tricuspid valve regurgitation is moderate, there are multiple jets.  9. The inferior vena cava is dilated in size with <50% respiratory variability, suggesting right atrial pressure of 15 mmHg. Comparison(s): Tricuspid regurgitation increase from 2015 report. FINDINGS  Left Ventricle: Left ventricular ejection fraction, by estimation, is 55 to 60%. The left ventricle has normal function. The left ventricle has no regional wall motion abnormalities. The left ventricular internal cavity size was normal in size. There is  no left ventricular hypertrophy. Left ventricular diastolic parameters were normal. Right Ventricle: The right ventricular size is normal. No increase in right ventricular wall thickness. Right ventricular systolic function is mildly reduced. There is severely elevated pulmonary artery systolic pressure. The tricuspid regurgitant velocity is 4.19 m/s, and with an assumed right atrial pressure of 15 mmHg, the estimated right ventricular systolic pressure is 19.4 mmHg. Left Atrium: Left atrial size was moderately dilated. Right Atrium: Right atrial size was severely dilated. Pericardium: A small pericardial effusion is present. The pericardial effusion is posterior to the left ventricle. There is no evidence of cardiac tamponade. Mitral Valve: The mitral  valve is normal in structure. Mild mitral valve regurgitation. No evidence of mitral valve stenosis. Tricuspid Valve: The tricuspid valve is normal in structure. Tricuspid valve regurgitation is moderate . No evidence of tricuspid stenosis. Aortic Valve: The aortic valve is tricuspid. Aortic valve regurgitation is not visualized. No aortic stenosis is present. Aortic valve peak gradient measures 5.4 mmHg. Pulmonic Valve: The pulmonic valve was normal in structure. Pulmonic valve regurgitation is not visualized. No evidence of pulmonic stenosis. Aorta: The aortic root and ascending aorta are structurally normal, with no evidence of dilitation. Venous: The inferior vena cava is dilated in size with less than 50% respiratory variability, suggesting right atrial pressure of 15 mmHg. IAS/Shunts: No atrial level shunt detected by color flow Doppler.  LEFT VENTRICLE PLAX 2D LVIDd:         5.00 cm   Diastology LVIDs:         3.90 cm   LV e' lateral: 10.80 cm/s LV PW:         1.00 cm LV IVS:        0.90 cm LVOT diam:     2.10 cm LV SV:         52 LV SV Index:   32 LVOT Area:     3.46 cm  RIGHT VENTRICLE         IVC TAPSE (M-mode): 1.7 cm  IVC diam: 2.40 cm LEFT ATRIUM             Index        RIGHT  ATRIUM           Index LA diam:        3.40 cm 2.05 cm/m   RA Area:     32.20 cm LA Vol (A2C):   46.1 ml 27.77 ml/m  RA Volume:   121.00 ml 72.89 ml/m LA Vol (A4C):   66.7 ml 40.18 ml/m LA Biplane Vol: 59.9 ml 36.08 ml/m  AORTIC VALVE                 PULMONIC VALVE AV Area (Vmax): 3.03 cm     PV Vmax:       0.80 m/s AV Vmax:        116.50 cm/s  PV Peak grad:  2.5 mmHg AV Peak Grad:   5.4 mmHg LVOT Vmax:      102.00 cm/s LVOT Vmean:     57.500 cm/s LVOT VTI:       0.151 m  AORTA Ao Root diam: 3.40 cm Ao Asc diam:  3.00 cm MR Peak grad: 81.0 mmHg   TRICUSPID VALVE MR Vmax:      450.00 cm/s TR Peak grad:   70.2 mmHg                           TR Vmax:        419.00 cm/s                            SHUNTS                            Systemic VTI:  0.15 m                           Systemic Diam: 2.10 cm Rudean Haskell MD Electronically signed by Rudean Haskell MD Signature Date/Time: 03/26/2022/2:57:01 PM    Final           Blood pressure (!) 141/93, pulse 92, temperature 97.8 F (36.6 C), temperature source Oral, resp. rate (!) 25, height '5\' 1"'$  (1.549 m), weight 67 kg, SpO2 100 %.   Medical Problem List and Plan: 1. Functional deficits secondary to embolic right MCA infarcts involving insula and corona radiata             -patient may shower             -ELOS/Goals: 7-10 days, supervision to mod I goals with PT, OT, SLP 2.  Antithrombotics: -DVT/anticoagulation:  Pharmaceutical: Eliquis             -antiplatelet therapy: N/a 3. Pain Management: Tylenol prn.  4. Mood/Behavior/Sleep: LCSW to follow for evaluation and support.              -antipsychotic agents: N/A 5. Neuropsych/cognition: This patient is capable of making decisions on his own behalf. 6. Skin/Wound Care: Routine pressure relief measures.             --encourage appropriate nutrition 7. Fluids/Electrolytes/Nutrition:  Monitor I/O. Check CMET in am.  8. HTN: Monitor BP TID. Controlled without meds at this time.  9. AKI: Encourage fluid intake. Recheck renal status in am.  10 Hypocalcemia: Ionized calcium-1.08. Add calcium supplement 11. Leucocytosis: Monitor for signs of infection. Recheck CBC in am. 12. Vitamin B12 deficiency: Vit B12-170.  --Will add B12 supplement.  13. Chronic Vitamin D deficiency: Vit D-26.7.  Add ergocalciferol for supplement.  14. Elevated Triglycerids-647:  Question statin v/s alternate 15. ETOH abuse: Reports intermittent use-->monitor for signs of withdrawal             --continue to educate on importance of cessation.  `           --continue folic acid and Vitamin B1.        Bary Leriche, PA-C 03/28/2022   I have personally performed a face to face diagnostic evaluation of this patient and formulated the  key components of the plan.  Additionally, I have personally reviewed laboratory data, imaging studies, as well as relevant notes and concur with the physician assistant's documentation above.  The patient's status has not changed from the original H&P.  Any changes in documentation from the acute care chart have been noted above.  Meredith Staggers, MD, [facility]rown

## 2022-03-28 NOTE — Discharge Summary (Addendum)
Stroke Discharge Summary  Patient ID: edrei norgaard   MRN: [id number]      DOB: 1956/06/30  Date of Admission: 03/25/2022 Date of Discharge: 03/28/2022  Attending Physician:  Antony Contras MD Consultant(s):   rehabilitation medicine Patient's PCP:  Fayrene Helper, MD  Discharge Diagnoses: right insula and corona radiata embolic right MCA branch infarcts due to thrombus dissolved by TNK administration etiology cardiogenic embolism from new onset A-fib Principal Problem:   Stroke (cerebrum) ([place]) Active Problems:   Dependence on respirator (ventilator) status ([place])   Alcohol intoxication ([place])   AKI (acute kidney injury) ([place])   Atrial fibrillation with controlled ventricular response ([place])   Medications to be continued on Rehab Allergies as of 03/28/2022   No Known Allergies      Medication List     STOP taking these medications    amoxicillin 500 MG capsule Commonly known as: AMOXIL   aspirin 81 MG chewable tablet       TAKE these medications    amLODipine 5 MG tablet Commonly known as: NORVASC Take 1 tablet (5 mg total) by mouth daily.   apixaban 5 MG Tabs tablet Commonly known as: ELIQUIS Take 1 tablet (5 mg total) by mouth 2 (two) times daily.   ibuprofen 800 MG tablet Commonly known as: ADVIL Take 800 mg by mouth every 8 (eight) hours as needed.   metoprolol tartrate 25 MG tablet Commonly known as: LOPRESSOR Take 1 tablet (25 mg total) by mouth 2 (two) times daily.   PILOCARPINE-EPINEPHRINE OP Apply 1 drop to eye daily.        LABORATORY STUDIES CBC    Component Value Date/Time   WBC 13.1 (H) 03/26/2022 0628   RBC 3.29 (L) 03/26/2022 0628   HGB 11.3 (L) 03/26/2022 0628   HGB 14.0 01/03/2021 1046   HCT 33.2 (L) 03/26/2022 0628   HCT 39.9 01/03/2021 1046   PLT 244 03/26/2022 0628   PLT 308 01/03/2021 1046   MCV 100.9 (H) 03/26/2022 0628   MCV 97 01/03/2021 1046   MCH 34.3 (H) 03/26/2022 0628   MCHC 34.0 03/26/2022 0628   RDW 13.5  03/26/2022 0628   RDW 13.1 01/03/2021 1046   LYMPHSABS 0.4 (L) 03/26/2022 0628   LYMPHSABS 1.7 01/03/2021 1046   MONOABS 0.5 03/26/2022 0628   EOSABS 0.0 03/26/2022 0628   EOSABS 0.1 01/03/2021 1046   BASOSABS 0.0 03/26/2022 0628   BASOSABS 0.1 01/03/2021 1046   CMP    Component Value Date/Time   NA 136 03/26/2022 0628   NA 139 10/10/2021 0830   K 4.2 03/26/2022 0628   CL 104 03/26/2022 0628   CO2 21 (L) 03/26/2022 0628   GLUCOSE 118 (H) 03/26/2022 0628   BUN 14 03/26/2022 0628   BUN 10 10/10/2021 0830   CREATININE 1.36 (H) 03/26/2022 0628   CREATININE 1.03 05/17/2020 1444   CALCIUM 8.1 (L) 03/26/2022 0628   PROT 7.3 03/25/2022 1816   PROT 6.9 10/10/2021 0830   ALBUMIN 3.9 03/25/2022 1816   ALBUMIN 4.4 10/10/2021 0830   AST 27 03/25/2022 1816   ALT 20 03/25/2022 1816   ALKPHOS 77 03/25/2022 1816   BILITOT 1.3 (H) 03/25/2022 1816   BILITOT 0.7 10/10/2021 0830   GFRNONAA 57 (L) 03/26/2022 0628   GFRNONAA 76 05/17/2020 1444   GFRAA 88 05/17/2020 1444   COAGS Lab Results  Component Value Date   INR 1.1 03/25/2022   Lipid Panel    Component Value Date/Time  CHOL 145 03/26/2022 0628   CHOL 170 10/10/2021 0830   TRIG 60 03/28/2022 0215   HDL 37 (L) 03/26/2022 0628   HDL 63 10/10/2021 0830   CHOLHDL 3.9 03/26/2022 0628   VLDL UNABLE TO CALCULATE IF TRIGLYCERIDE OVER 400 mg/dL 03/26/2022 0628   LDLCALC UNABLE TO CALCULATE IF TRIGLYCERIDE OVER 400 mg/dL 03/26/2022 0628   LDLCALC 82 10/10/2021 0830   LDLCALC 68 05/17/2020 1444   HgbA1C  Lab Results  Component Value Date   HGBA1C 5.3 03/26/2022   Urinalysis    Component Value Date/Time   COLORURINE YELLOW 03/26/2022 0628   APPEARANCEUR HAZY (A) 03/26/2022 0628   LABSPEC 1.041 (H) 03/26/2022 0628   PHURINE 5.0 03/26/2022 0628   GLUCOSEU NEGATIVE 03/26/2022 0628   HGBUR LARGE (A) 03/26/2022 0628   HGBUR negative 06/05/2010 1321   BILIRUBINUR NEGATIVE 03/26/2022 0628   KETONESUR 5 (A) 03/26/2022 0628    PROTEINUR NEGATIVE 03/26/2022 0628   UROBILINOGEN 0.2 06/05/2010 1321   NITRITE NEGATIVE 03/26/2022 0628   LEUKOCYTESUR NEGATIVE 03/26/2022 0628   Urine Drug Screen     Component Value Date/Time   LABOPIA NONE DETECTED 03/26/2022 0628   COCAINSCRNUR NONE DETECTED 03/26/2022 0628   LABBENZ NONE DETECTED 03/26/2022 0628   AMPHETMU NONE DETECTED 03/26/2022 0628   THCU NONE DETECTED 03/26/2022 0628   LABBARB NONE DETECTED 03/26/2022 0628    Alcohol Level    Component Value Date/Time   ETH 181 (H) 03/25/2022 1816     SIGNIFICANT DIAGNOSTIC STUDIES IR ANGIO INTRA EXTRACRAN SEL COM CAROTID INNOMINATE BILAT MOD SED  Result Date: 03/27/2022 CLINICAL DATA:  New onset left-sided weakness. Occluded inferior division distal M2 branch on CT angiogram of the head and neck. EXAM: IR ANGIO EXTRACRAN SELECT COM CAROTID INNOMINATE BILAT MOD SED COMPARISON:  CT of the head and neck March 25, 2022. MEDICATIONS: Heparin 0 units IV. Ancef 2 g IV antibiotic was administered within 1 hour of the procedure. ANESTHESIA/SEDATION: General anesthesia. CONTRAST:  Omnipaque 300 approximately 40 cc. FLUOROSCOPY TIME:  Fluoroscopy Time: 6 minutes 42 seconds (614 mGy). COMPLICATIONS: None immediate. TECHNIQUE: Informed written consent was obtained from the patient's caregiver after a thorough discussion of the procedural risks, benefits and alternatives. All questions were addressed. Maximal Sterile Barrier Technique was utilized including caps, mask, sterile gowns, sterile gloves, sterile drape, hand hygiene and skin antiseptic. A timeout was performed prior to the initiation of the procedure. The right groin was prepped and draped in the usual sterile fashion. Thereafter using modified Seldinger technique, transfemoral access into the right common femoral artery was obtained without difficulty. Over an 0.035 inch guidewire, a 5 French Pinnacle sheath was inserted. Through this, and also over an 0.035 inch guidewire, a 5  [place] J[place] 1 catheter was advanced to the aortic arch region and selectively positioned in the right common carotid artery, the left common carotid artery and the right vertebral artery. FINDINGS: The innominate arteriogram demonstrates the proximal right subclavian artery and the right common carotid artery to be widely patent. The right vertebral artery origin is widely patent. The vessel is seen to opacify to the cranial skull base to the right posterior-inferior cerebellar artery. Distal flow is demonstrated in the right vertebrobasilar junction. The right common carotid arteriogram demonstrates the right external carotid artery and its major branches to be widely patent. The right internal carotid artery at the bulb to the cranial skull base is widely patent. The petrous, the cavernous and the supraclinoid segments demonstrate wide patency. The right  middle cerebral artery and the right anterior cerebral artery opacify into the capillary and venous phases. No angiographic evidence of intraluminal filling defects or of occlusions is demonstrated. Focal moderate narrowing is seen at the origin of a branch in the distal M2 region. The left common carotid arteriogram demonstrates the left external carotid artery and its major branches to be widely patent. The left internal carotid artery at the bulb to the cranial skull base demonstrates wide patency. The petrous, the cavernous and the supraclinoid segments demonstrate wide patency. The left middle cerebral artery and the left anterior cerebral artery opacify into the capillary and venous phases. IMPRESSION: Angiographically moderate stenosis noted of a mid parietal branch arising from the M2 M3 region of the inferior division of the left middle cerebral artery. No evidence of intraluminal filling defects noted. PLAN: As per referring MD. Electronically Signed   By: Luanne Bras M.D.   On: 03/27/2022 10:35   MR BRAIN WO CONTRAST  Result Date:  03/26/2022 CLINICAL DATA:  Slurred speech, falls, left-sided weakness EXAM: MRI HEAD WITHOUT CONTRAST TECHNIQUE: Multiplanar, multiecho pulse sequences of the brain and surrounding structures were obtained without intravenous contrast. COMPARISON:  03/25/2022 FINDINGS: Brain: New punctate foci of restricted diffusion with ADC correlates in the right frontal lobe cortex (series 2, image 40), left posterior frontal lobe cortex (series 2, image 42), and right occipital lobe cortex (series 2, image 26). The previously noted focus of mildly increased signal on diffusion-weighted imaging in the right corona radiata is no longer seen. No acute hemorrhage, mass, mass effect, or midline shift. No hemosiderin deposition to suggest remote hemorrhage. No hydrocephalus or extra-axial collection. Vascular: Normal arterial flow voids. Previously noted FLAIR signal abnormality in the right MCA branches is no longer seen. Skull and upper cervical spine: Normal marrow signal. Sinuses/Orbits: Mucosal thickening in the maxillary sinuses. Otherwise negative. Other: The mastoids are well aerated. IMPRESSION: New punctate acute infarcts in the right frontal, left posterior frontal, and right occipital lobe cortex. Electronically Signed   By: Merilyn Baba M.D.   On: 03/26/2022 19:41   ECHOCARDIOGRAM COMPLETE  Result Date: 03/26/2022    ECHOCARDIOGRAM REPORT   Patient Name:   PERFECTO PURDY Date of Exam: 03/26/2022 Medical Rec #:  [id number]      Height:       61.0 in Accession #:    [id number]     Weight:       147.6 lb Date of Birth:  [birth year]       BSA:          1.660 m Patient Age:    10 years       BP:           136/86 mmHg Patient Gender: M              HR:           95 bpm. Exam Location:  Inpatient Procedure: 2D Echo, Cardiac Doppler and Color Doppler Indications:    Stroke  History:        Patient has prior history of Echocardiogram examinations, most                 recent 03/22/2014. Risk Factors:Hypertension.  Sonographer:     Jefferey Pica Referring Phys: [id number] [place]  1. Left ventricular ejection fraction, by estimation, is 55 to 60%. The left ventricle has normal function. The left ventricle has no regional wall motion abnormalities. Left ventricular diastolic parameters were normal.  2. Right ventricular systolic function is mildly reduced. The right ventricular size is normal. There is severely elevated pulmonary artery systolic pressure.  3. Left atrial size was moderately dilated.  4. Right atrial size was severely dilated.  5. The aortic valve is tricuspid. Aortic valve regurgitation is not visualized. No aortic stenosis is present.  6. A small pericardial effusion is present. The pericardial effusion is posterior to the left ventricle. There is no evidence of cardiac tamponade.  7. The mitral valve is normal in structure. Mild mitral valve regurgitation. No evidence of mitral stenosis.  8. Tricuspid valve regurgitation is moderate, there are multiple jets.  9. The inferior vena cava is dilated in size with <50% respiratory variability, suggesting right atrial pressure of 15 mmHg. Comparison(s): Tricuspid regurgitation increase from 2015 report. FINDINGS  Left Ventricle: Left ventricular ejection fraction, by estimation, is 55 to 60%. The left ventricle has normal function. The left ventricle has no regional wall motion abnormalities. The left ventricular internal cavity size was normal in size. There is  no left ventricular hypertrophy. Left ventricular diastolic parameters were normal. Right Ventricle: The right ventricular size is normal. No increase in right ventricular wall thickness. Right ventricular systolic function is mildly reduced. There is severely elevated pulmonary artery systolic pressure. The tricuspid regurgitant velocity is 4.19 m/s, and with an assumed right atrial pressure of 15 mmHg, the estimated right ventricular systolic pressure is 44.3 mmHg. Left Atrium: Left atrial size was  moderately dilated. Right Atrium: Right atrial size was severely dilated. Pericardium: A small pericardial effusion is present. The pericardial effusion is posterior to the left ventricle. There is no evidence of cardiac tamponade. Mitral Valve: The mitral valve is normal in structure. Mild mitral valve regurgitation. No evidence of mitral valve stenosis. Tricuspid Valve: The tricuspid valve is normal in structure. Tricuspid valve regurgitation is moderate . No evidence of tricuspid stenosis. Aortic Valve: The aortic valve is tricuspid. Aortic valve regurgitation is not visualized. No aortic stenosis is present. Aortic valve peak gradient measures 5.4 mmHg. Pulmonic Valve: The pulmonic valve was normal in structure. Pulmonic valve regurgitation is not visualized. No evidence of pulmonic stenosis. Aorta: The aortic root and ascending aorta are structurally normal, with no evidence of dilitation. Venous: The inferior vena cava is dilated in size with less than 50% respiratory variability, suggesting right atrial pressure of 15 mmHg. IAS/Shunts: No atrial level shunt detected by color flow Doppler.  LEFT VENTRICLE PLAX 2D LVIDd:         5.00 cm   Diastology LVIDs:         3.90 cm   LV e' lateral: 10.80 cm/s LV PW:         1.00 cm LV IVS:        0.90 cm LVOT diam:     2.10 cm LV SV:         52 LV SV Index:   32 LVOT Area:     3.46 cm  RIGHT VENTRICLE         IVC TAPSE (M-mode): 1.7 cm  IVC diam: 2.40 cm LEFT ATRIUM             Index        RIGHT ATRIUM           Index LA diam:        3.40 cm 2.05 cm/m   RA Area:     32.20 cm LA Vol (A2C):   46.1 ml 27.77 ml/m  RA Volume:  121.00 ml 72.89 ml/m LA Vol (A4C):   66.7 ml 40.18 ml/m LA Biplane Vol: 59.9 ml 36.08 ml/m  AORTIC VALVE                 PULMONIC VALVE AV Area (Vmax): 3.03 cm     PV Vmax:       0.80 m/s AV Vmax:        116.50 cm/s  PV Peak grad:  2.5 mmHg AV Peak Grad:   5.4 mmHg LVOT Vmax:      102.00 cm/s LVOT Vmean:     57.500 cm/s LVOT VTI:       0.151 m   AORTA Ao Root diam: 3.40 cm Ao Asc diam:  3.00 cm MR Peak grad: 81.0 mmHg   TRICUSPID VALVE MR Vmax:      450.00 cm/s TR Peak grad:   70.2 mmHg                           TR Vmax:        419.00 cm/s                            SHUNTS                           Systemic VTI:  0.15 m                           Systemic Diam: 2.10 cm Rudean Haskell MD Electronically signed by Rudean Haskell MD Signature Date/Time: 03/26/2022/2:57:01 PM    Final    DG CHEST PORT 1 VIEW  Result Date: 03/26/2022 CLINICAL DATA:  [id number] stroke EXAM: PORTABLE CHEST 1 VIEW COMPARISON:  Jan 15, 2020 FINDINGS: The cardiomediastinal silhouette is unchanged and enlarged in contour.ETT tip terminates immediately above the carina. Small LEFT pleural effusion. LEFT basilar airspace opacity, nonspecific. No pneumothorax. Visualized abdomen is unremarkable. Sequela of prior fracture of the LEFT humerus. IMPRESSION: 1. ETT tip terminates immediately above the carina. Recommend retraction. 2. LEFT basilar airspace opacity, nonspecific with differential considerations including infection, atelectasis or aspiration. Small LEFT pleural effusion. Electronically Signed   By: Valentino Saxon M.D.   On: 03/26/2022 07:45   MR BRAIN WO CONTRAST  Addendum Date: 03/25/2022   ADDENDUM REPORT: 03/25/2022 20:00 ADDENDUM: CTA did show segmental occlusion or near occlusion within the inferior division of the MCA, but did not show any visible abnormality in the superior division. Electronically Signed   By: Nelson Chimes M.D.   On: 03/25/2022 20:00   Result Date: 03/25/2022 CLINICAL DATA:  Neuro deficit, acute, stroke suspected EXAM: MRI HEAD WITHOUT CONTRAST MRA HEAD WITHOUT CONTRAST TECHNIQUE: Multiplanar, multi-echo pulse sequences of the brain and surrounding structures were acquired without intravenous contrast. Angiographic images of the Circle of Willis were acquired using MRA technique without intravenous contrast. COMPARISON:  CT angiography  earlier same day FINDINGS: MRI HEAD FINDINGS Brain: No definite completed acute infarction. One could question a very small focus of restricted diffusion within the radiating white matter tracts of the corona radiata on the right. Also question small cortical infarction in the deep insula. Elsewhere, no focal abnormality affects the brainstem or cerebellum. No old brain insult. No hydrocephalus or extra-axial collection. Vascular: Increased FLAIR signal within MCA branches of the deep insula and right parietal region likely indicating slow flow.  Skull and upper cervical spine: Negative Sinuses/Orbits: Mild mucosal inflammatory changes of the sinuses. Orbits negative. Other: None MRA HEAD FINDINGS Anterior circulation: Both internal carotid arteries are patent through the skull base and siphon regions. Left anterior and middle cerebral vessels are normal. Right anterior cerebral vessels are normal. Diminished visualization of more distal MCA branch vessels in both the inferior and superior divisions. Posterior circulation: Both vertebral arteries are patent to the basilar. No basilar stenosis. Superior cerebellar and posterior cerebral arteries show flow. More distal PCA branch vessels show atherosclerotic narrowing and irregularity. Anatomic variants: None significant. IMPRESSION: Subtle signs of early infarction affecting the right deep insula and a few foci of the white matter in the right corona radiata. No evidence of mass effect or hemorrhage. No evidence of old brain insult. Slow flow in the right MCA branches, particularly within the inferior division. MR angiography shows poor visualization of the distal MCA branches of both the inferior and superior divisions, consistent with slow flow. Occlusion was not shown by CTA done 45 minutes ago. Electronically Signed: By: Nelson Chimes M.D. On: 03/25/2022 19:31   MR ANGIO HEAD WO CONTRAST  Addendum Date: 03/25/2022   ADDENDUM REPORT: 03/25/2022 20:00 ADDENDUM:  CTA did show segmental occlusion or near occlusion within the inferior division of the MCA, but did not show any visible abnormality in the superior division. Electronically Signed   By: Nelson Chimes M.D.   On: 03/25/2022 20:00   Result Date: 03/25/2022 CLINICAL DATA:  Neuro deficit, acute, stroke suspected EXAM: MRI HEAD WITHOUT CONTRAST MRA HEAD WITHOUT CONTRAST TECHNIQUE: Multiplanar, multi-echo pulse sequences of the brain and surrounding structures were acquired without intravenous contrast. Angiographic images of the Circle of Willis were acquired using MRA technique without intravenous contrast. COMPARISON:  CT angiography earlier same day FINDINGS: MRI HEAD FINDINGS Brain: No definite completed acute infarction. One could question a very small focus of restricted diffusion within the radiating white matter tracts of the corona radiata on the right. Also question small cortical infarction in the deep insula. Elsewhere, no focal abnormality affects the brainstem or cerebellum. No old brain insult. No hydrocephalus or extra-axial collection. Vascular: Increased FLAIR signal within MCA branches of the deep insula and right parietal region likely indicating slow flow. Skull and upper cervical spine: Negative Sinuses/Orbits: Mild mucosal inflammatory changes of the sinuses. Orbits negative. Other: None MRA HEAD FINDINGS Anterior circulation: Both internal carotid arteries are patent through the skull base and siphon regions. Left anterior and middle cerebral vessels are normal. Right anterior cerebral vessels are normal. Diminished visualization of more distal MCA branch vessels in both the inferior and superior divisions. Posterior circulation: Both vertebral arteries are patent to the basilar. No basilar stenosis. Superior cerebellar and posterior cerebral arteries show flow. More distal PCA branch vessels show atherosclerotic narrowing and irregularity. Anatomic variants: None significant. IMPRESSION: Subtle  signs of early infarction affecting the right deep insula and a few foci of the white matter in the right corona radiata. No evidence of mass effect or hemorrhage. No evidence of old brain insult. Slow flow in the right MCA branches, particularly within the inferior division. MR angiography shows poor visualization of the distal MCA branches of both the inferior and superior divisions, consistent with slow flow. Occlusion was not shown by CTA done 45 minutes ago. Electronically Signed: By: Nelson Chimes M.D. On: 03/25/2022 19:31   CT ANGIO HEAD NECK W WO CM W PERF (CODE STROKE)  Result Date: 03/25/2022 CLINICAL DATA:  Stroke  suspected EXAM: CT ANGIOGRAPHY HEAD AND NECK CT PERFUSION BRAIN TECHNIQUE: Multidetector CT imaging of the head and neck was performed using the standard protocol during bolus administration of intravenous contrast. Multiplanar CT image reconstructions and MIPs were obtained to evaluate the vascular anatomy. Carotid stenosis measurements (when applicable) are obtained utilizing NASCET criteria, using the distal internal carotid diameter as the denominator. Multiphase CT imaging of the brain was performed following IV bolus contrast injection. Subsequent parametric perfusion maps were calculated using RAPID software. RADIATION DOSE REDUCTION: This exam was performed according to the departmental dose-optimization program which includes automated exposure control, adjustment of the mA and/or kV according to patient size and/or use of iterative reconstruction technique. CONTRAST:  128m OMNIPAQUE IOHEXOL 350 MG/ML SOLN COMPARISON:  CT head 03/25/2022 no prior CTA FINDINGS: CT HEAD FINDINGS For noncontrast findings, please see same day CT head. CTA NECK FINDINGS Aortic arch: Standard branching. Imaged portion shows no evidence of aneurysm or dissection. No significant stenosis of the major arch vessel origins. Right carotid system: No evidence of dissection, occlusion, or hemodynamically  significant stenosis (greater than 50%). Left carotid system: No evidence of dissection, occlusion, or hemodynamically significant stenosis (greater than 50%). Vertebral arteries: Left dominant. No evidence of dissection, occlusion, or hemodynamically significant stenosis (greater than 50%). Skeleton: No acute osseous abnormality. Other neck: None. Upper chest: No focal pulmonary opacity or pleural effusion. Review of the MIP images confirms the above findings CTA HEAD FINDINGS Anterior circulation: Both internal carotid arteries are patent to the termini, without significant stenosis. A1 segments patent. Normal anterior communicating artery. Anterior cerebral arteries are patent to their distal aspects. No M1 stenosis or occlusion. Short-segment of a right M2 branch (series 6, images 1 100-101) with distal reconstitution (series 6, image 95), although the remainder of the vessel is somewhat poorly opacified (series 6, image 91). MCA branches otherwise well perfused and symmetric. Posterior circulation: Vertebral arteries patent to the vertebrobasilar junction without stenosis. Posterior inferior cerebellar arteries patent proximally. Basilar patent to its distal aspect. Superior cerebellar arteries patent proximally. Patent P1 segments. PCAs perfused to their distal aspects without stenosis. The bilateral posterior communicating arteries are patent. Venous sinuses: As permitted by contrast timing, patent. Anatomic variants: None significant. Review of the MIP images confirms the above findings CT Brain Perfusion Findings: ASPECTS: 10 CBF (<30%) Volume: 013mPerfusion (Tmax>6.0s) volume: 5327mismatch Volume: 72m49mfarction Location:No infarct core, penumbra in the posterior left MCA territory IMPRESSION: 1. Occlusion of a right M2 branch, with subsequent reconstitution, although downstream opacification is diminished. 2. No infarct core. Area of decreased perfusion (penumbra) in the posterior right MCA territory  measures 53 mL. 3. No other intracranial large vessel occlusion or significant stenosis. 4. No hemodynamically significant stenosis in the neck. These findings were discussed by telephone on 03/25/2022 at 7:00 pm with provider SRISHTI BHAGAT . Electronically Signed   By: AlisMerilyn Baba.   On: 03/25/2022 19:08   CT Cervical Spine Wo Contrast  Result Date: 03/25/2022 CLINICAL DATA:  Fall with neck pain EXAM: CT CERVICAL SPINE WITHOUT CONTRAST TECHNIQUE: Multidetector CT imaging of the cervical spine was performed without intravenous contrast. Multiplanar CT image reconstructions were also generated. RADIATION DOSE REDUCTION: This exam was performed according to the departmental dose-optimization program which includes automated exposure control, adjustment of the mA and/or kV according to patient size and/or use of iterative reconstruction technique. COMPARISON:  None Available. FINDINGS: Alignment: Straightening of the cervical spine. No subluxation. Facet alignment within normal limits Skull base and  vertebrae: No acute fracture. No primary bone lesion or focal pathologic process. Soft tissues and spinal canal: No prevertebral fluid or swelling. No visible canal hematoma. Disc levels: Mild multilevel degenerative change. Facet degenerative changes at multiple levels Upper chest: Negative. Other: None IMPRESSION: Straightening of the cervical spine.  No acute osseous abnormality Electronically Signed   By: Donavan Foil M.D.   On: 03/25/2022 18:50   CT HEAD CODE STROKE WO CONTRAST  Result Date: 03/25/2022 CLINICAL DATA:  Code stroke. EXAM: CT HEAD WITHOUT CONTRAST TECHNIQUE: Contiguous axial images were obtained from the base of the skull through the vertex without intravenous contrast. RADIATION DOSE REDUCTION: This exam was performed according to the departmental dose-optimization program which includes automated exposure control, adjustment of the mA and/or kV according to patient size and/or use of  iterative reconstruction technique. COMPARISON:  01/09/2010 FINDINGS: Brain: No evidence of acute infarction, hemorrhage, cerebral edema, mass, mass effect, or midline shift. No hydrocephalus or extra-axial collection. Vascular: No hyperdense vessel. Skull: Negative for fracture or focal lesion. Sinuses/Orbits: No acute finding. Other: The mastoid air cells are well aerated. ASPECTS [facility] Stroke Program Early CT Score) - Ganglionic level infarction (caudate, lentiform nuclei, internal capsule, insula, M1-M3 cortex): 7 - Supraganglionic infarction (M4-M6 cortex): 3 Total score (0-10 with 10 being normal): 10 IMPRESSION: 1. No acute intracranial process. 2. ASPECTS is 10 Code stroke imaging results were communicated on 03/25/2022 at 6:45 pm to provider ZAMMIT via telephone, who verbally acknowledged these results. Electronically Signed   By: Merilyn Baba M.D.   On: 03/25/2022 18:49       HISTORY OF PRESENT ILLNESS Mr. ERYN KREJCI is a 66 y.o. male with history of HTN, HLD, glaucoma, tobacco use and alcohol use presenting with acute onset slurred speech, two falls and acute onset left sided weakness.  He was given TNK and brought here for mechanical thrombectomy.  No LVO was seen on angiogram, no intervention done. MRI shows acute stroke in right insula and corona radiata. Atrial fibrillation discovered due hospitalization and cardiology was consulted for new onset afib. They recommend oral anticoagulation with Eliquis '5mg'$  BID which was started on 03/27/2022. Patient is neurologically stable with some slowed upper extremity movements and weakness exacerbated by left shoulder pain due to a fall.  HOSPITAL COURSE Stroke:  right insula and corona radiata Etiology:  thrombus dissolved by TNK administration etiology cardiogenic embolism from new onset A-fib Code Stroke CT head No acute abnormality.  ASPECTS 10.    CTA head & neck occlusion of right M2 branch CT perfusion no core, 53 mL penumbra MRI  early  infarction in right deep insula and corona radiata MRA  slow flow in right MCA branches but no occlusion 2D Echo  EF 55-60%, moderately dilated left atrium, no atrial level shunt LDL 30 HgbA1c 5.3 VTE prophylaxis - SCDs aspirin 81 mg daily prior to admission, now on ASA and Plavix-> will switch to eliquis per cardiology  Therapy recommendations:  CIR Disposition:  pending   Hypertension Home meds:  amlodipine 5 mg daily, metoprolol 25 mg daily Stable Keep SBP 120-140 for 24 hours then <160 Long-term BP goal normotensive   Hyperlipidemia Home meds:  none LDL 30, goal < 70, direct LDL pending Triglycerides 647 -> 60   Atrial Fibrillation- new onset Cardiology consulted Rate is controlled CHA2DS2-VASc Score = 4  Recommend Eliquis '5mg'$  BID    Other Stroke Risk Factors Advanced Age >/= 56  Cigarette smoker advised to stop smoking ETOH use,  alcohol level 181, advised to drink no more than 2 drink(s) a day   Other Active Problems   DISCHARGE EXAM Blood pressure 113/80, pulse 82, temperature 98.2 F (36.8 C), temperature source Oral, resp. rate (!) 22, height '5\' 1"'$  (1.549 m), weight 67 kg, SpO2 100 %.  NEURO:  Mental Status: AA&Ox3  Speech/Language: speech is without dysarthria or aphasia.  Fluency, and comprehension intact.   Cranial Nerves:  II: PERRL. Visual fields full.  III, IV, VI: EOMI. Eyelids elevate symmetrically.  V: Sensation is intact to light touch and symmetrical to face.  VII: Smile is symmetrical.   VIII: hearing intact to voice. IX, X: Phonation is normal.  VE:LFYBOFBP shrug 5/5. XII: tongue is midline without fasciculations. Motor: 5/5 strength to all muscle groups tested.  Diminished fine finger movements on the left and orbits right over left upper extremity. Tone: is normal and bulk is normal. LUE ROM limited by shoulder pain Sensation- Intact to light touch bilaterally but diminished in left arm  Coordination: FTN intact bilaterally, HKS: no ataxia  in BLE. No drift. Rapid alternating movements slowed bilaterally Gait- deferred  Discharge Diet      Diet   Diet Heart Room service appropriate? Yes with Assist; Fluid consistency: Thin   liquids  DISCHARGE PLAN Disposition:  Transfer to [facility] for ongoing PT, OT and ST Eliquis (apixaban) daily for secondary stroke prevention in the setting of Atrial Fibrillation Recommend ongoing stroke risk factor control by Primary Care Physician at time of discharge from inpatient rehabilitation. Follow-up PCP Fayrene Helper, MD in 2 weeks following discharge from rehab. Follow-up in [facility] in 8 weeks following discharge from rehab, office to schedule an appointment.   33 minutes were spent preparing discharge.  Patient seen and examined by NP/APP with MD. MD to update note as needed.   Janine Ores, DNP, FNP-BC [facility] Pager: [phone]  I have personally obtained history,examined this patient, reviewed notes, independently viewed imaging studies, participated in medical decision making and plan of care.ROS completed by me personally and pertinent positives fully documented  I have made any additions or clarifications directly to the above note. Agree with note above.    Antony Contras, MD Medical Director [facility] Pager: [phone] 03/28/2022 1:33 PM

## 2022-03-28 NOTE — PMR Pre-admission (Signed)
PMR Admission Coordinator Pre-Admission Assessment  Patient: Alexander Simmons is an 66 y.o., male MRN: [id number] DOB: 01/31/1956 Height: _0  (154.9 cm)yes Weight: 67 kg  Insurance Information HMO:     PPO:      PCP:      IPA:      80/20:      OTHER:  PRIMARY: [employer] Dual complete      Policy#: [id number]      Subscriber: pt CM Name: Karleen Hampshire      Phone#: [phone] option #7     Fax#: [phone] Pre-Cert#: [id number] approved for 14 days**      Employer:  Benefits:  Phone #: [phone]     Name: 8/1 Eff. Date: 09/26/2021     Deduct: $233      Out of Pocket Max: $8300      Life Max: none CIR: $1325 co pay per admit      SNF: no c co pay days 1 until 20; $200 co pay per day days 21 until 100 Outpatient: 80%     Co-Pay: 20% Home Health: 100%      Co-Pay: none DME: 80%     Co-Pay: 20% Providers: in network  SECONDARY: Medicaid [place] [facility]      Policy#: [id number]  Financial Counselor:       Phone#:   The "Data Collection Information Summary" for patients in Inpatient Rehabilitation Facilities with attached "Privacy Act [place] Records" was provided and verbally reviewed with: Patient and Family  Emergency Contact Information Contact Information     Name Relation Home Work Mobile   Mims,[name] Cousin   [phone]      Current Medical History  Patient Admitting Diagnosis: CVA  History of Present Illness: 66 year old male with history of HTN, HLD, glaucoma, tobacco and alcohol abuse. Presented with a fall and left sided weakness on 03/25/22 to [facility]. . MRI early infarction in right deep insula and corona radiata. MRA slow flow in right MCA branches but no occlusion. He was given TNK and transferred to [facility] for mechanical thrombectomy. No LVO seen on angiogram, therefore no intervention done.   Found to have new atrial fibrillation. Cardiology consulted and recommends anticoagulation with Eliquis. LDL 30, Hgb A1c 5.3. Placed on ASA and Plavix  but switched to Eliquis per cardiology recommendation. Home meds of Amlodipine and metoprolol. To add Statin. Recommended to stop smoking. ETOH level 181 on admit. ON CIWA protocol.   Complete NIHSS TOTAL: 0  Patient's medical record from [facility]  has been reviewed by the rehabilitation admission coordinator and physician.  Past Medical History  Past Medical History:  Diagnosis Date   Alcohol abuse    Allergy    Cor pulmonale (HCC)    By echocardiography   Glaucoma    Hyperlipidemia    Hypertension    Small bowel obstruction ([place])    Syncope 12/2009   Attributed to use of narcotics plus alcohol   Tobacco abuse    Has the patient had major surgery during 100 days prior to admission? No  Family History   family history includes Other in an other family member.  Current Medications  Current Facility-Administered Medications:     stroke: early stages of recovery book, , Does not apply, Once, Donnetta Simpers, MD   0.9 %  sodium chloride infusion, , Intravenous, Continuous, Garvin Fila, MD, Stopped at 03/27/22 2001   acetaminophen (TYLENOL) tablet 650 mg, 650 mg, Oral, Q4H PRN **OR**  acetaminophen (TYLENOL) 160 MG/5ML solution 650 mg, 650 mg, Per Tube, Q4H PRN **OR** acetaminophen (TYLENOL) suppository 650 mg, 650 mg, Rectal, Q4H PRN, Deveshwar, Sanjeev, MD   apixaban (ELIQUIS) tablet 5 mg, 5 mg, Oral, BID, Leonie Man, Pramod S, MD, 5 mg at 03/28/22 1423   Chlorhexidine Gluconate Cloth 2 % PADS 6 each, 6 each, Topical, Q0600, Donnetta Simpers, MD, 6 each at 95/32/02 3343   folic acid (FOLVITE) tablet 1 mg, 1 mg, Oral, Daily, Gleason, Otilio Carpen, PA-C, 1 mg at 03/28/22 5686   labetalol (NORMODYNE) injection 10 mg, 10 mg, Intravenous, Q10 min PRN, Bhagat, Srishti L, MD, 10 mg at 03/26/22 0108   multivitamin with minerals tablet 1 tablet, 1 tablet, Oral, Daily, Gleason, Otilio Carpen, PA-C, 1 tablet at 03/28/22 1683   Oral care mouth rinse, 15 mL, Mouth Rinse, PRN, Garvin Fila,  MD   pantoprazole (PROTONIX) EC tablet 40 mg, 40 mg, Oral, Daily, Donnetta Simpers, MD, 40 mg at 03/28/22 7290   senna-docusate (Senokot-S) tablet 1 tablet, 1 tablet, Per Tube, QHS PRN, Donnetta Simpers, MD   thiamine (VITAMIN B1) injection 100 mg, 100 mg, Intravenous, Daily **OR** thiamine (VITAMIN B1) tablet 100 mg, 100 mg, Oral, Daily, Donnetta Simpers, MD, 100 mg at 03/28/22 2111  Patients Current Diet:  Diet Order             Diet Heart Room service appropriate? Yes with Assist; Fluid consistency: Thin  Diet effective now                  Precautions / Restrictions Precautions Precautions: Fall Restrictions Weight Bearing Restrictions: No   Has the patient had 2 or more falls or a fall with injury in the past year? No  Prior Activity Level Community (5-7x/wk): does not drive, I without AD with adls and mobility  Prior Functional Level Self Care: Did the patient need help bathing, dressing, using the toilet or eating? Independent  Indoor Mobility: Did the patient need assistance with walking from room to room (with or without device)? Independent  Stairs: Did the patient need assistance with internal or external stairs (with or without device)? Independent  Functional Cognition: Did the patient need help planning regular tasks such as shopping or remembering to take medications? Needed some help  Patient Information Are you of Hispanic, Latino/a,or Spanish origin?: A. No, not of Hispanic, Latino/a, or Spanish origin What is your race?: B. Black or African American Do you need or want an interpreter to communicate with a doctor or health care staff?: 0. No  Patient's Response To:  Health Literacy and Transportation Is the patient able to respond to health literacy and transportation needs?: Yes Health Literacy - How often do you need to have someone help you when you read instructions, pamphlets, or other written material from your doctor or pharmacy?: Always In  the past 12 months, has lack of transportation kept you from medical appointments or from getting medications?: No In the past 12 months, has lack of transportation kept you from meetings, work, or from getting things needed for daily living?: No  Home Assistive Devices / Equipment Home Equipment: None  Prior Device Use: Indicate devices/aids used by the patient prior to current illness, exacerbation or injury? None of the above  Current Functional Level Cognition  Arousal/Alertness: Awake/alert Overall Cognitive Status: Impaired/Different from baseline Orientation Level: Oriented X4 Following Commands: Follows one step commands consistently, Follows multi-step commands inconsistently Safety/Judgement: Decreased awareness of deficits General Comments: Pt A and O  x 4 today but little recall; unable to count up by 3; for 3 animals with "c" said cat, rabbit, dog; Attention: Sustained Sustained Attention: Appears intact Memory: Impaired Memory Impairment: Storage deficit, Retrieval deficit, Decreased recall of new information Awareness: Impaired Awareness Impairment: Intellectual impairment Problem Solving: Impaired Problem Solving Impairment: Functional basic Executive Function: Organizing, Self Monitoring, Self Correcting, Sequencing Sequencing: Impaired Sequencing Impairment: Verbal basic Organizing: Impaired Organizing Impairment: Functional basic Self Monitoring: Impaired Self Monitoring Impairment: Functional basic Self Correcting: Impaired Self Correcting Impairment: Functional basic    Extremity Assessment (includes Sensation/Coordination)  Upper Extremity Assessment: RUE deficits/detail, LUE deficits/detail RUE Deficits / Details: slowed and deliberate coordination, geenralized weakness globally LUE Deficits / Details: slowed and deliberate coordination, geenralized weakness globally. Limited over head ROM due to L shoulder pain  Lower Extremity Assessment: Defer to PT  evaluation RLE Deficits / Details: ROM WFL; MMT 4/5 throughout RLE Sensation: WNL RLE Coordination: decreased gross motor LLE Deficits / Details: ROM WFL; MMT 4/5 throughout LLE Sensation: WNL LLE Coordination: decreased gross motor    ADLs  Overall ADL's : Needs assistance/impaired Eating/Feeding: Independent, Sitting Grooming: Set up, Sitting Upper Body Bathing: Supervision/ safety, Sitting Lower Body Bathing: Moderate assistance, Sit to/from stand Upper Body Dressing : Supervision/safety, Sitting Lower Body Dressing: Moderate assistance, Sit to/from stand Lower Body Dressing Details (indicate cue type and reason): need assist for socks at EOB - pt states he is usually able to don socks indep Toilet Transfer: Minimal assistance, Ambulation, Regular Toilet Toileting- Clothing Manipulation and Hygiene: Supervision/safety, Sitting/lateral lean Functional mobility during ADLs: Minimal assistance General ADL Comments: slow and deliberate with impaire balance, and impaired cognition. Incontinent urine this date    Mobility  Overal bed mobility: Needs Assistance Bed Mobility: Supine to Sit, Sit to Supine Supine to sit: Min guard Sit to supine: Supervision General bed mobility comments: increased time, use of rail, cues to scoot forward    Transfers  Overall transfer level: Needs assistance Equipment used: Rolling walker (2 wheels) Transfers: Sit to/from Stand Sit to Stand: Min assist General transfer comment: Pt with multiple LOB upon standing requiring min A to steady; Performed x 4    Ambulation / Gait / Stairs / Wheelchair Mobility  Ambulation/Gait Ambulation/Gait assistance: Herbalist (Feet): 100 Feet Assistive device: Rolling walker (2 wheels) Gait Pattern/deviations: Step-through pattern, Decreased stride length General Gait Details: Pt initially unsteady with 4 LOB within 40' requiring min A to recover, did improve with last 60'.  Cues for RW use. Gait  velocity: decreased    Posture / Balance Balance Overall balance assessment: Needs assistance Sitting-balance support: No upper extremity supported Sitting balance-Leahy Scale: Good Standing balance support: Bilateral upper extremity supported, No upper extremity supported Standing balance-Leahy Scale: Poor Standing balance comment: Had LOB with initial gait ; required RW; did improve over session and able to work on standing balance without UE support High Level Balance Comments: Standing with min guard feet apart and feet together 30 sec, reaching 3" outside BOS min guard, turn circle min guard increased time.  Tried standing marching but needing min A at time 10 reps    Special needs/care consideration [name] states he is "slow" at baseline and likely illiterate. He is not [name]'s caregiver, he assists with household management/cleaning Not a daily drinker, but drinks a lot every couple of days   Previous Home Environment  Living Arrangements:  (lives with "cousin" [name])  Lives With: Other (Comment) Available Help at Discharge: Other (Comment) Type of Home:  House Home Layout: One level Home Access: Stairs to enter Entrance Stairs-Rails: Right, Left Entrance Stairs-Number of Steps: 4 Bathroom Shower/Tub: Multimedia programmer: Handicapped height Bathroom Accessibility: Yes How Accessible: Accessible via walker [place]: No  Discharge Living Setting Plans for Discharge Living Setting: House, Lives with (comment) ("cousin" [name]) Type of Home at Discharge: House Discharge Home Layout: One level Discharge Home Access: Stairs to enter Entrance Stairs-Rails: Right, Left Entrance Stairs-Number of Steps: 4 Discharge Bathroom Shower/Tub: Walk-in shower Discharge Bathroom Toilet: Handicapped height Discharge Bathroom Accessibility: Yes How Accessible: Accessible via walker Does the patient have any problems obtaining your medications?:  No  Social/Family/Support Systems Contact Information: Oceanographer, "cousin" he has lived with her for 5 years Anticipated Caregiver: [name] Anticipated Ambulance person Information: see contacts Caregiver Availability: 24/7 Discharge Plan Discussed with Primary Caregiver: Yes Is Caregiver In Agreement with Plan?: Yes Does Caregiver/Family have Issues with Lodging/Transportation while Pt is in Rehab?: No  Goals Patient/Family Goal for Rehab: Mod I to supervision with PT, OT and SLP Expected length of stay: ELOS 7 to 10 days Additional Information: [name] states he is "slow". Likely can not read also Pt/Family Agrees to Admission and willing to participate: Yes Program Orientation Provided & Reviewed with Pt/Caregiver Including Roles  & Responsibilities: Yes  Decrease burden of Care through IP rehab admission: n/a  Possible need for SNF placement upon discharge: bot anticipated  Patient Condition: I have reviewed medical records from [facility], spoken with CM, and patient and family member. I met with patient at the bedside for inpatient rehabilitation assessment.  Patient will benefit from ongoing PT, OT, and SLP, can actively participate in 3 hours of therapy a day 5 days of the week, and can make measurable gains during the admission.  Patient will also benefit from the coordinated team approach during an Inpatient Acute Rehabilitation admission.  The patient will receive intensive therapy as well as Rehabilitation physician, nursing, social worker, and care management interventions.  Due to bladder management, bowel management, safety, skin/wound care, disease management, medication administration, pain management, and patient education the patient requires 24 hour a day rehabilitation nursing.  The patient is currently min assist overall with mobility and basic ADLs.  Discharge setting and therapy post discharge at home with home health is anticipated.  Patient has agreed to  participate in the Acute Inpatient Rehabilitation Program and will admit today.  Preadmission Screen Completed By:  Cleatrice Burke, 03/28/2022 10:17 AM ______________________________________________________________________   Discussed status with Dr. Naaman Plummer on 03/28/2022 at 48 and received approval for admission today.  Admission Coordinator:  Cleatrice Burke, RN, time 6789 Date 03/28/2022   Assessment/Plan: Diagnosis: right insular/corona radiata infarct Does the need for close, 24 hr/day Medical supervision in concert with the patient's rehab needs make it unreasonable for this patient to be served in a less intensive setting? Yes Co-Morbidities requiring supervision/potential complications: HTN, etoh abuse, new a fib Due to bladder management, bowel management, safety, skin/wound care, disease management, medication administration, pain management, and patient education, does the patient require 24 hr/day rehab nursing? Yes Does the patient require coordinated care of a physician, rehab nurse, PT, OT, and SLP to address physical and functional deficits in the context of the above medical diagnosis(es)? Yes Addressing deficits in the following areas: balance, endurance, locomotion, strength, transferring, bowel/bladder control, bathing, dressing, feeding, grooming, toileting, cognition, speech, swallowing, and psychosocial support Can the patient actively participate in an intensive therapy program of at least 3 hrs  of therapy 5 days a week? Yes The potential for patient to make measurable gains while on inpatient rehab is excellent Anticipated functional outcomes upon discharge from inpatient rehab: supervision PT, supervision OT, supervision SLP Estimated rehab length of stay to reach the above functional goals is: 7-10 days Anticipated discharge destination: Home 10. Overall Rehab/Functional Prognosis: excellent   MD Signature: Meredith Staggers, MD, [place] Director Rehabilitation Services 03/28/2022

## 2022-03-28 NOTE — Plan of Care (Signed)
Pt arrived to unit

## 2022-03-28 NOTE — Progress Notes (Signed)
Inpatient Rehabilitation Admission Medication Review by a Pharmacist  A complete drug regimen review was completed for this patient to identify any potential clinically significant medication issues.  High Risk Drug Classes Is patient taking? Indication by Medication  Antipsychotic Yes Compazine - nausea  Anticoagulant Yes Apixaban -new onset non-valvular atrial fibrillation and stroke prevention  Antibiotic No   Opioid No   Antiplatelet No   Hypoglycemics/insulin No   Vasoactive Medication No   Chemotherapy No   Other Yes Calcium citrate - supplement for hypocalcemia Cyanocobalamine: Vitamin B12 deficiency/supplement Folic acid, multivitamin, thiamine-supplements due to ETOH abuse. Ergocalciferol (Vit D)- Vitamin D deficiency Miralax, bisacodyl, senokot S, fleets enema-prn constipation Maalox/mylanta-indigestion Diphenhydramine- prn itching Trazodone-sleep        Type of Medication Issue Identified Description of Issue Recommendation(s)  Drug Interaction(s) (clinically significant)     Duplicate Therapy     Allergy     No Medication Administration End Date     Incorrect Dose     Additional Drug Therapy Needed     Significant med changes from prior encounter (inform family/care partners about these prior to discharge). ASA-  changed to ASA and plavix during [facility] acute care admit then on 03/27/22 changed to Eliquis alone per cardiology/ Neuro okay with Eliquis.  Metoprolol- pt not taking PTA due to running out of medication and cannot afford to refill.  Was not resumed on [facility] acute care admission.  Pilocarpine-epinephrine eye drops  ASA discontinued Now on Eliquis per Cards /Neuro for new onset Afib and  CVA prophylaxis.  Restart other PTA meds when and if necessary during CIR admission or at time of discharge, if warranted   Other       Clinically significant medication issues were identified that warrant physician communication and completion of prescribed/recommended  actions by midnight of the next day:   No  Name of provider notified for urgent issues identified:   Provider Method of Notification:  Patient has not had for longer than a month due to medication running out and cannot afford to refill   Pharmacist comments:   Time spent performing this drug regimen review (minutes):  [place], [place] Pharmacist [phone] 03/28/2022 3:50 PM   Please check AMION for all [place] phone numbers After 10:00 PM, call [place]

## 2022-03-28 NOTE — Progress Notes (Signed)
Patient arrived from [place], [facility] at approximately 1430. Patient appears alert aned denies pain.

## 2022-03-28 NOTE — Progress Notes (Addendum)
Inpatient Rehabilitation Admissions Coordinator                      I contacted pt's cousin, [name], by phone. I reviewed goals and expectations of a possible CIR admit.  I discussed our current masking guidelines due to COVID concerns. She contacted Jeneen Rinks by phone and they are in agreement to admit. I have insurance approval and CIR bed to admit him to today . I will contact acute team and stroke service to clarify if he is felt to be medically ready.  Danne Baxter, RN, MSN Rehab Admissions Coordinator [phone] 03/28/2022 8:31 AM  Dr Leonie Man has given clearance to admit to CIR today.

## 2022-03-28 NOTE — H&P (Signed)
Physical Medicine and Rehabilitation Admission H&P    Chief Complaint  Patient presents with   Stroke with functional deficits      HPI: Alexander Simmons is a 66 year old male with history of HTN, glaucoma, cor pulmonale, tobacco and ETOH abus, learning disability(?);  who was evaluated at [facility] on 03/24/22 with reports of fall while taking out trash in am followed by slurred, left facial droop, multiple falls with onset of vomiting since the last fall.  UDS negative. ETOH level 181. CT head negative and CTA w/P head showed occlusion of right M2 branch with decreased area of perfusion in posterior R-MCA 53 ml and he received TNK. He ws starated on IV thiamine and CIWA protocol and transferred to [facility] 7/31 for intervention. He underwent cerebral angio with was negative for filling defects or occlusions. with MRI/MRA brain done revealing early infarct right deep insula  and corona radiata as well as poor visualization of distal MCA branches with slow flow but no occlusion.  He was extubated without difficulty.   Repeat MRI/MRA brain 08/01 done revealing   new punctate infarcts in right frontal, left posterior frontal and right occipital lobe.  2D echo showed EF 55-60% with no aortic stenosis and moderate TVR. He was found to have A fib on 08/02 and Dr. [name] consulted for input. Cards reported patient with history of palpitations in the past with work up revealing revealing  PSVT and alcoholic CM and on BB without cardioembolic stroke. TSH WNL and she recommended resuming metoprolol in needed for rate control. ST evaluation revealed severe cognitive communication deficits with mild dysarthria and SLUMS score 15/30. PT/OT evals done revealing weakness, balance deficits with posterior leanas well as dizziness affecting ADLs and mobility. CIR recommended due to functional decline.     Review of Systems  Constitutional:  Negative for chills and fever.  HENT:  Negative for hearing loss and tinnitus.    Eyes:  Negative for blurred vision.  Respiratory:  Negative for cough and hemoptysis.   Cardiovascular:  Negative for chest pain and palpitations.  Gastrointestinal:  Negative for abdominal pain, constipation and nausea.  Genitourinary:  Negative for dysuria and urgency.  Musculoskeletal:  Negative for myalgias and neck pain.  Skin:  Negative for rash.  Neurological:  Positive for weakness. Negative for dizziness and headaches.  Psychiatric/Behavioral:  The patient does not have insomnia.      Past Medical History:  Diagnosis Date   Alcohol abuse    Allergy    Cor pulmonale (HCC)    By echocardiography   Glaucoma    Hyperlipidemia    Hypertension    Small bowel obstruction ([place])    Syncope 12/2009   Attributed to use of narcotics plus alcohol   Tobacco abuse     Past Surgical History:  Procedure Laterality Date   ABDOMINAL SURGERY     COLONOSCOPY N/A 01/28/2013   Procedure: COLONOSCOPY;  Surgeon: Rogene Houston, MD;  Location: [facility];  Service: Endoscopy;  Laterality: N/A;  1225   IR ANGIO INTRA EXTRACRAN SEL COM CAROTID INNOMINATE BILAT MOD SED  03/25/2022   IR ANGIO VERTEBRAL SEL SUBCLAVIAN INNOMINATE UNI R MOD SED  03/27/2022   PILONIDAL CYST EXCISION  2005   Dr. Irving Shows   RADIOLOGY WITH ANESTHESIA N/A 03/25/2022   Procedure: IR WITH ANESTHESIA;  Surgeon: Radiologist, Medication, MD;  Location: [place];  Service: Radiology;  Laterality: N/A;   TOOTH EXTRACTION  Family History  Problem Relation Age of Onset   Other Other        FH unknown-patient raised in foster care system   Colon cancer Neg Hx     Social History: Lives with aunt (is 57 years old and falls frequently) and is her caregiver. Never married and has family in town.  Disabled due learning disability?psych issues? He  reports that he has never smoked. His smokeless tobacco use includes snuff and chew since age 34. He  Per reports current alcohol use of about 1/2 cup of gin couple of times a week.  He does not use drugs.   Allergies: No Known Allergies   Medications Prior to Admission  Medication Sig Dispense Refill   amLODipine (NORVASC) 5 MG tablet Take 1 tablet (5 mg total) by mouth daily. 90 tablet 1   aspirin 81 MG chewable tablet Chew 81 mg by mouth daily.     ibuprofen (ADVIL) 800 MG tablet Take 800 mg by mouth every 8 (eight) hours as needed.     PILOCARPINE-EPINEPHRINE OP Apply 1 drop to eye daily.     amoxicillin (AMOXIL) 500 MG capsule Take 500 mg by mouth 3 (three) times daily. (Patient not taking: Reported on 03/26/2022)     metoprolol tartrate (LOPRESSOR) 25 MG tablet Take 1 tablet (25 mg total) by mouth 2 (two) times daily. (Patient not taking: Reported on 03/26/2022) 180 tablet 1      Home: Home Living Family/patient expects to be discharged to:: Private residence Living Arrangements:  (lives with "cousin" [name]) Available Help at Discharge: Other (Comment) Type of Home: House Home Access: Stairs to enter [employer] of Steps: 4 Entrance Stairs-Rails: Right, Left Home Layout: One level Bathroom Shower/Tub: Multimedia programmer: Handicapped height Bathroom Accessibility: Yes Home Equipment: None  Lives With: Other (Comment)   Functional History: Prior Function Prior Level of Function : Independent/Modified Independent Mobility Comments: no AD; pt could ambulate in community ADLs Comments: He lives with his "cousin" [place]. He asissts with household management  Functional Status:  Mobility: Bed Mobility Overal bed mobility: Needs Assistance Bed Mobility: Supine to Sit, Sit to Supine Supine to sit: Min guard Sit to supine: Supervision General bed mobility comments: increased time, use of rail, cues to scoot forward Transfers Overall transfer level: Needs assistance Equipment used: Rolling walker (2 wheels) Transfers: Sit to/from Stand Sit to Stand: Min assist General transfer comment: Pt with multiple LOB upon standing  requiring min A to steady; Performed x 4 Ambulation/Gait Ambulation/Gait assistance: Min assist Gait Distance (Feet): 100 Feet Assistive device: Rolling walker (2 wheels) Gait Pattern/deviations: Step-through pattern, Decreased stride length General Gait Details: Pt initially unsteady with 4 LOB within 40' requiring min A to recover, did improve with last 60'.  Cues for RW use. Gait velocity: decreased    ADL: ADL Overall ADL's : Needs assistance/impaired Eating/Feeding: Independent, Sitting Grooming: Set up, Sitting Upper Body Bathing: Supervision/ safety, Sitting Lower Body Bathing: Moderate assistance, Sit to/from stand Upper Body Dressing : Supervision/safety, Sitting Lower Body Dressing: Moderate assistance, Sit to/from stand Lower Body Dressing Details (indicate cue type and reason): need assist for socks at EOB - pt states he is usually able to don socks indep Toilet Transfer: Minimal assistance, Ambulation, Regular Toilet Toileting- Clothing Manipulation and Hygiene: Supervision/safety, Sitting/lateral lean Functional mobility during ADLs: Minimal assistance General ADL Comments: slow and deliberate with impaire balance, and impaired cognition. Incontinent urine this date  Cognition: Cognition Overall Cognitive Status: Impaired/Different from baseline Arousal/Alertness:  Awake/alert Orientation Level: Oriented X4 Year:  (2002) Month: August Day of Week: Correct Attention: Sustained Sustained Attention: Appears intact Memory: Impaired Memory Impairment: Storage deficit, Retrieval deficit, Decreased recall of new information Awareness: Impaired Awareness Impairment: Intellectual impairment Problem Solving: Impaired Problem Solving Impairment: Functional basic Executive Function: Organizing, Self Monitoring, Self Correcting, Sequencing Sequencing: Impaired Sequencing Impairment: Verbal basic Organizing: Impaired Organizing Impairment: Functional basic Self  Monitoring: Impaired Self Monitoring Impairment: Functional basic Self Correcting: Impaired Self Correcting Impairment: Functional basic Cognition Arousal/Alertness: Awake/alert Behavior During Therapy: WFL for tasks assessed/performed Overall Cognitive Status: Impaired/Different from baseline Area of Impairment: Memory, Following commands, Safety/judgement, Awareness, Problem solving Orientation Level: Disoriented to, Time, Situation Memory: Decreased short-term memory Following Commands: Follows one step commands consistently, Follows multi-step commands inconsistently Safety/Judgement: Decreased awareness of deficits Awareness: Emergent Problem Solving: Requires verbal cues, Requires tactile cues General Comments: Pt A and O x 4 today but little recall; unable to count up by 3; for 3 animals with "c" said cat, rabbit, dog;   Blood pressure (!) 141/93, pulse 92, temperature 97.8 F (36.6 C), temperature source Oral, resp. rate (!) 25, height '5\' 1"'$  (1.549 m), weight 67 kg, SpO2 100 %. Physical Exam Vitals and nursing note reviewed.  Constitutional:      General: He is not in acute distress.    Appearance: Normal appearance. He is not ill-appearing.  HENT:     Head: Normocephalic.     Right Ear: External ear normal.     Left Ear: External ear normal.     Mouth/Throat:     Mouth: Mucous membranes are moist.     Comments: Poor dentition Eyes:     Conjunctiva/sclera: Conjunctivae normal.  Cardiovascular:     Rate and Rhythm: Normal rate and regular rhythm.  Pulmonary:     Effort: Pulmonary effort is normal. No respiratory distress.     Breath sounds: Normal breath sounds. No wheezing or rales.  Abdominal:     General: Bowel sounds are normal. There is no distension.     Palpations: Abdomen is soft.     Tenderness: There is no abdominal tenderness.  Musculoskeletal:        General: No swelling or tenderness.     Cervical back: Normal range of motion.  Skin:    Comments:  Multiple lesions in different stages of healing on BUE and BLE with scars from prior bites?.   Neurological:     Mental Status: He is alert and oriented to person, place, and time.     Comments: Mild dysarthria. Alert and appropriate. He was able to recall day, date, month and follow simple motor commands without difficulty. Reasonable insight and awareness. Left central 7 and mild tongue deviation. RUE and RLE 5/5. LUE 4/5. LLE 4- to 4/5. No focal sensory deficits. DTR's 1+. No cerebellar signs but decreased [place] LUE, +PD.  Psychiatric:        Mood and Affect: Mood normal.        Behavior: Behavior normal.     Results for orders placed or performed during the hospital encounter of 03/25/22 (from the past 48 hour(s))  LDL cholesterol, direct     Status: None   Collection Time: 03/27/22  6:41 AM  Result Value Ref Range   Direct LDL 30 0 - 99 mg/dL    Comment: Performed at [facility], [address] [place], [place] [address]  TSH     Status: None   Collection Time: 03/27/22  6:41 AM  Result Value Ref Range   TSH 1.529 0.350 - 4.500 uIU/mL    Comment: Performed by a 3rd Generation assay with a functional sensitivity of <=0.01 uIU/mL. Performed at [facility], [place] [address], [place], [place] [address]   Triglycerides     Status: None   Collection Time: 03/28/22  2:15 AM  Result Value Ref Range   Triglycerides 60 <150 mg/dL    Comment: Performed at [facility] [address], [place], [place] [address]   MR BRAIN WO CONTRAST  Result Date: 03/26/2022 CLINICAL DATA:  Slurred speech, falls, left-sided weakness EXAM: MRI HEAD WITHOUT CONTRAST TECHNIQUE: Multiplanar, multiecho pulse sequences of the brain and surrounding structures were obtained without intravenous contrast. COMPARISON:  03/25/2022 FINDINGS: Brain: New punctate foci of restricted diffusion with ADC correlates in the right frontal lobe cortex (series 2, image 40), left posterior frontal lobe cortex (series  2, image 42), and right occipital lobe cortex (series 2, image 26). The previously noted focus of mildly increased signal on diffusion-weighted imaging in the right corona radiata is no longer seen. No acute hemorrhage, mass, mass effect, or midline shift. No hemosiderin deposition to suggest remote hemorrhage. No hydrocephalus or extra-axial collection. Vascular: Normal arterial flow voids. Previously noted FLAIR signal abnormality in the right MCA branches is no longer seen. Skull and upper cervical spine: Normal marrow signal. Sinuses/Orbits: Mucosal thickening in the maxillary sinuses. Otherwise negative. Other: The mastoids are well aerated. IMPRESSION: New punctate acute infarcts in the right frontal, left posterior frontal, and right occipital lobe cortex. Electronically Signed   By: Merilyn Baba M.D.   On: 03/26/2022 19:41   ECHOCARDIOGRAM COMPLETE  Result Date: 03/26/2022    ECHOCARDIOGRAM REPORT   Patient Name:   BENJIE RICKETSON Date of Exam: 03/26/2022 Medical Rec #:  [id number]      Height:       61.0 in Accession #:    [id number]     Weight:       147.6 lb Date of Birth:  05/28/1956       BSA:          1.660 m Patient Age:    77 years       BP:           136/86 mmHg Patient Gender: M              HR:           95 bpm. Exam Location:  Inpatient Procedure: 2D Echo, Cardiac Doppler and Color Doppler Indications:    Stroke  History:        Patient has prior history of Echocardiogram examinations, most                 recent 03/22/2014. Risk Factors:Hypertension.  Sonographer:    Jefferey Pica Referring Phys: [id number] [place]  1. Left ventricular ejection fraction, by estimation, is 55 to 60%. The left ventricle has normal function. The left ventricle has no regional wall motion abnormalities. Left ventricular diastolic parameters were normal.  2. Right ventricular systolic function is mildly reduced. The right ventricular size is normal. There is severely elevated pulmonary artery  systolic pressure.  3. Left atrial size was moderately dilated.  4. Right atrial size was severely dilated.  5. The aortic valve is tricuspid. Aortic valve regurgitation is not visualized. No aortic stenosis is present.  6. A small pericardial effusion is present. The pericardial effusion is posterior to the left ventricle.  There is no evidence of cardiac tamponade.  7. The mitral valve is normal in structure. Mild mitral valve regurgitation. No evidence of mitral stenosis.  8. Tricuspid valve regurgitation is moderate, there are multiple jets.  9. The inferior vena cava is dilated in size with <50% respiratory variability, suggesting right atrial pressure of 15 mmHg. Comparison(s): Tricuspid regurgitation increase from 2015 report. FINDINGS  Left Ventricle: Left ventricular ejection fraction, by estimation, is 55 to 60%. The left ventricle has normal function. The left ventricle has no regional wall motion abnormalities. The left ventricular internal cavity size was normal in size. There is  no left ventricular hypertrophy. Left ventricular diastolic parameters were normal. Right Ventricle: The right ventricular size is normal. No increase in right ventricular wall thickness. Right ventricular systolic function is mildly reduced. There is severely elevated pulmonary artery systolic pressure. The tricuspid regurgitant velocity is 4.19 m/s, and with an assumed right atrial pressure of 15 mmHg, the estimated right ventricular systolic pressure is 09.3 mmHg. Left Atrium: Left atrial size was moderately dilated. Right Atrium: Right atrial size was severely dilated. Pericardium: A small pericardial effusion is present. The pericardial effusion is posterior to the left ventricle. There is no evidence of cardiac tamponade. Mitral Valve: The mitral valve is normal in structure. Mild mitral valve regurgitation. No evidence of mitral valve stenosis. Tricuspid Valve: The tricuspid valve is normal in structure. Tricuspid valve  regurgitation is moderate . No evidence of tricuspid stenosis. Aortic Valve: The aortic valve is tricuspid. Aortic valve regurgitation is not visualized. No aortic stenosis is present. Aortic valve peak gradient measures 5.4 mmHg. Pulmonic Valve: The pulmonic valve was normal in structure. Pulmonic valve regurgitation is not visualized. No evidence of pulmonic stenosis. Aorta: The aortic root and ascending aorta are structurally normal, with no evidence of dilitation. Venous: The inferior vena cava is dilated in size with less than 50% respiratory variability, suggesting right atrial pressure of 15 mmHg. IAS/Shunts: No atrial level shunt detected by color flow Doppler.  LEFT VENTRICLE PLAX 2D LVIDd:         5.00 cm   Diastology LVIDs:         3.90 cm   LV e' lateral: 10.80 cm/s LV PW:         1.00 cm LV IVS:        0.90 cm LVOT diam:     2.10 cm LV SV:         52 LV SV Index:   32 LVOT Area:     3.46 cm  RIGHT VENTRICLE         IVC TAPSE (M-mode): 1.7 cm  IVC diam: 2.40 cm LEFT ATRIUM             Index        RIGHT ATRIUM           Index LA diam:        3.40 cm 2.05 cm/m   RA Area:     32.20 cm LA Vol (A2C):   46.1 ml 27.77 ml/m  RA Volume:   121.00 ml 72.89 ml/m LA Vol (A4C):   66.7 ml 40.18 ml/m LA Biplane Vol: 59.9 ml 36.08 ml/m  AORTIC VALVE                 PULMONIC VALVE AV Area (Vmax): 3.03 cm     PV Vmax:       0.80 m/s AV Vmax:        116.50 cm/s  PV Peak grad:  2.5 mmHg AV Peak Grad:   5.4 mmHg LVOT Vmax:      102.00 cm/s LVOT Vmean:     57.500 cm/s LVOT VTI:       0.151 m  AORTA Ao Root diam: 3.40 cm Ao Asc diam:  3.00 cm MR Peak grad: 81.0 mmHg   TRICUSPID VALVE MR Vmax:      450.00 cm/s TR Peak grad:   70.2 mmHg                           TR Vmax:        419.00 cm/s                            SHUNTS                           Systemic VTI:  0.15 m                           Systemic Diam: 2.10 cm Rudean Haskell MD Electronically signed by Rudean Haskell MD Signature Date/Time:  03/26/2022/2:57:01 PM    Final       Blood pressure (!) 141/93, pulse 92, temperature 97.8 F (36.6 C), temperature source Oral, resp. rate (!) 25, height '5\' 1"'$  (1.549 m), weight 67 kg, SpO2 100 %.  Medical Problem List and Plan: 1. Functional deficits secondary to embolic right MCA infarcts involving insula and corona radiata  -patient may shower  -ELOS/Goals: 7-10 days, supervision to mod I goals with PT, OT, SLP 2.  Antithrombotics: -DVT/anticoagulation:  Pharmaceutical: Eliquis  -antiplatelet therapy: N/a 3. Pain Management: Tylenol prn.  4. Mood/Behavior/Sleep: LCSW to follow for evaluation and support.   -antipsychotic agents: N/A 5. Neuropsych/cognition: This patient is capable of making decisions on his own behalf. 6. Skin/Wound Care: Routine pressure relief measures.  --encourage appropriate nutrition 7. Fluids/Electrolytes/Nutrition:  Monitor I/O. Check CMET in am.  8. HTN: Monitor BP TID. Controlled without meds at this time.  9. AKI: Encourage fluid intake. Recheck renal status in am.  10 Hypocalcemia: Ionized calcium-1.08. Add calcium supplement 11. Leucocytosis: Monitor for signs of infection. Recheck CBC in am. 12. Vitamin B12 deficiency: Vit B12-170.  --Will add B12 supplement.  13. Chronic Vitamin D deficiency: Vit D-26.7. Add ergocalciferol for supplement.  14. Elevated Triglycerids-647:  Question statin v/s alternate 15. ETOH abuse: Reports intermittent use-->monitor for signs of withdrawal  --continue to educate on importance of cessation.  ` --continue folic acid and Vitamin B1.      Bary Leriche, PA-C 03/28/2022

## 2022-03-28 NOTE — Progress Notes (Signed)
Meredith Staggers, MD  Physician Physical Medicine and Rehabilitation PMR Pre-admission     Signed Date of Service:  03/28/2022  9:11 AM  Related encounter: ED to Hosp-Admission (Discharged) from 03/25/2022 in [place] NEURO/TRAUMA/SURGICAL ICU   Signed      PMR Admission Coordinator Pre-Admission Assessment   Patient: Alexander Simmons is an 66 y.o., male MRN: [id number] DOB: [birth year] Height: 5\' 1"  (154.9 cm)yes Weight: 67 kg   Insurance Information HMO:     PPO:      PCP:      IPA:      80/20:      OTHER:  PRIMARY: [employer] Dual complete      Policy#: [id number]      Subscriber: pt CM Name: Karleen Hampshire      Phone#: [phone] option #7     Fax#: [phone] Pre-Cert#: [id number] approved for 14 days**      Employer:  Benefits:  Phone #: [phone]     Name: 8/1 Eff. Date: 09/26/2021     Deduct: $233      Out of Pocket Max: $8300      Life Max: none CIR: $1325 co pay per admit      SNF: no c co pay days 1 until 20; $200 co pay per day days 21 until 100 Outpatient: 80%     Co-Pay: 20% Home Health: 100%      Co-Pay: none DME: 80%     Co-Pay: 20% Providers: in network  SECONDARY: Medicaid [place] Access      Policy#: [id number]   Financial Counselor:       Phone#:    The "Data Collection Information Summary" for patients in Inpatient Rehabilitation Facilities with attached "Privacy Act [place] Records" was provided and verbally reviewed with: Patient and Family   Emergency Contact Information Contact Information       Name Relation Home Work Mobile    Mims,[name] Cousin     [phone]         Current Medical History  Patient Admitting Diagnosis: CVA   History of Present Illness: 66 year old male with history of HTN, HLD, glaucoma, tobacco and alcohol abuse. Presented with a fall and left sided weakness on 03/25/22 to [facility]. . MRI early infarction in right deep insula and corona radiata. MRA slow flow in right MCA branches but no occlusion. He was given TNK  and transferred to [facility] for mechanical thrombectomy. No LVO seen on angiogram, therefore no intervention done.    Found to have new atrial fibrillation. Cardiology consulted and recommends anticoagulation with Eliquis. LDL 30, Hgb A1c 5.3. Placed on ASA and Plavix but switched to Eliquis per cardiology recommendation. Home meds of Amlodipine and metoprolol. To add Statin. Recommended to stop smoking. ETOH level 181 on admit. ON CIWA protocol.    Complete NIHSS TOTAL: 0   Patient's medical record from [facility]  has been reviewed by the rehabilitation admission coordinator and physician.   Past Medical History      Past Medical History:  Diagnosis Date   Alcohol abuse     Allergy     Cor pulmonale (HCC)      By echocardiography   Glaucoma     Hyperlipidemia     Hypertension     Small bowel obstruction ([place])     Syncope 12/2009    Attributed to use of narcotics plus alcohol   Tobacco abuse      Has the patient had  major surgery during 100 days prior to admission? No   Family History   family history includes Other in an other family member.   Current Medications   Current Facility-Administered Medications:     stroke: early stages of recovery book, , Does not apply, Once, Donnetta Simpers, MD   0.9 %  sodium chloride infusion, , Intravenous, Continuous, Garvin Fila, MD, Stopped at 03/27/22 2001   acetaminophen (TYLENOL) tablet 650 mg, 650 mg, Oral, Q4H PRN **OR** acetaminophen (TYLENOL) 160 MG/5ML solution 650 mg, 650 mg, Per Tube, Q4H PRN **OR** acetaminophen (TYLENOL) suppository 650 mg, 650 mg, Rectal, Q4H PRN, Deveshwar, Sanjeev, MD   apixaban (ELIQUIS) tablet 5 mg, 5 mg, Oral, BID, Leonie Man, Pramod S, MD, 5 mg at 03/28/22 1655   Chlorhexidine Gluconate Cloth 2 % PADS 6 each, 6 each, Topical, Q0600, Donnetta Simpers, MD, 6 each at 37/48/27 0786   folic acid (FOLVITE) tablet 1 mg, 1 mg, Oral, Daily, Gleason, Otilio Carpen, PA-C, 1 mg at 03/28/22 7544    labetalol (NORMODYNE) injection 10 mg, 10 mg, Intravenous, Q10 min PRN, Bhagat, Srishti L, MD, 10 mg at 03/26/22 0108   multivitamin with minerals tablet 1 tablet, 1 tablet, Oral, Daily, Gleason, Otilio Carpen, PA-C, 1 tablet at 03/28/22 9201   Oral care mouth rinse, 15 mL, Mouth Rinse, PRN, Garvin Fila, MD   pantoprazole (PROTONIX) EC tablet 40 mg, 40 mg, Oral, Daily, Donnetta Simpers, MD, 40 mg at 03/28/22 0071   senna-docusate (Senokot-S) tablet 1 tablet, 1 tablet, Per Tube, QHS PRN, Donnetta Simpers, MD   thiamine (VITAMIN B1) injection 100 mg, 100 mg, Intravenous, Daily **OR** thiamine (VITAMIN B1) tablet 100 mg, 100 mg, Oral, Daily, Donnetta Simpers, MD, 100 mg at 03/28/22 2197   Patients Current Diet:  Diet Order                  Diet Heart Room service appropriate? Yes with Assist; Fluid consistency: Thin  Diet effective now                       Precautions / Restrictions Precautions Precautions: Fall Restrictions Weight Bearing Restrictions: No    Has the patient had 2 or more falls or a fall with injury in the past year? No   Prior Activity Level Community (5-7x/wk): does not drive, I without AD with adls and mobility   Prior Functional Level Self Care: Did the patient need help bathing, dressing, using the toilet or eating? Independent   Indoor Mobility: Did the patient need assistance with walking from room to room (with or without device)? Independent   Stairs: Did the patient need assistance with internal or external stairs (with or without device)? Independent   Functional Cognition: Did the patient need help planning regular tasks such as shopping or remembering to take medications? Needed some help   Patient Information Are you of Hispanic, Latino/a,or Spanish origin?: A. No, not of Hispanic, Latino/a, or Spanish origin What is your race?: B. Black or African American Do you need or want an interpreter to communicate with a doctor or health care staff?:  0. No   Patient's Response To:  Health Literacy and Transportation Is the patient able to respond to health literacy and transportation needs?: Yes Health Literacy - How often do you need to have someone help you when you read instructions, pamphlets, or other written material from your doctor or pharmacy?: Always In the past 12 months, has lack of transportation kept  you from medical appointments or from getting medications?: No In the past 12 months, has lack of transportation kept you from meetings, work, or from getting things needed for daily living?: No   Home Assistive Devices / [place]: None   Prior Device Use: Indicate devices/aids used by the patient prior to current illness, exacerbation or injury? None of the above   Current Functional Level Cognition   Arousal/Alertness: Awake/alert Overall Cognitive Status: Impaired/Different from baseline Orientation Level: Oriented X4 Following Commands: Follows one step commands consistently, Follows multi-step commands inconsistently Safety/Judgement: Decreased awareness of deficits General Comments: Pt A and O x 4 today but little recall; unable to count up by 3; for 3 animals with "c" said cat, rabbit, dog; Attention: Sustained Sustained Attention: Appears intact Memory: Impaired Memory Impairment: Storage deficit, Retrieval deficit, Decreased recall of new information Awareness: Impaired Awareness Impairment: Intellectual impairment Problem Solving: Impaired Problem Solving Impairment: Functional basic Executive Function: Organizing, Self Monitoring, Self Correcting, Sequencing Sequencing: Impaired Sequencing Impairment: Verbal basic Organizing: Impaired Organizing Impairment: Functional basic Self Monitoring: Impaired Self Monitoring Impairment: Functional basic Self Correcting: Impaired Self Correcting Impairment: Functional basic    Extremity Assessment (includes Sensation/Coordination)   Upper  Extremity Assessment: RUE deficits/detail, LUE deficits/detail RUE Deficits / Details: slowed and deliberate coordination, geenralized weakness globally LUE Deficits / Details: slowed and deliberate coordination, geenralized weakness globally. Limited over head ROM due to L shoulder pain  Lower Extremity Assessment: Defer to PT evaluation RLE Deficits / Details: ROM WFL; MMT 4/5 throughout RLE Sensation: WNL RLE Coordination: decreased gross motor LLE Deficits / Details: ROM WFL; MMT 4/5 throughout LLE Sensation: WNL LLE Coordination: decreased gross motor     ADLs   Overall ADL's : Needs assistance/impaired Eating/Feeding: Independent, Sitting Grooming: Set up, Sitting Upper Body Bathing: Supervision/ safety, Sitting Lower Body Bathing: Moderate assistance, Sit to/from stand Upper Body Dressing : Supervision/safety, Sitting Lower Body Dressing: Moderate assistance, Sit to/from stand Lower Body Dressing Details (indicate cue type and reason): need assist for socks at EOB - pt states he is usually able to don socks indep Toilet Transfer: Minimal assistance, Ambulation, Regular Toilet Toileting- Clothing Manipulation and Hygiene: Supervision/safety, Sitting/lateral lean Functional mobility during ADLs: Minimal assistance General ADL Comments: slow and deliberate with impaire balance, and impaired cognition. Incontinent urine this date     Mobility   Overal bed mobility: Needs Assistance Bed Mobility: Supine to Sit, Sit to Supine Supine to sit: Min guard Sit to supine: Supervision General bed mobility comments: increased time, use of rail, cues to scoot forward     Transfers   Overall transfer level: Needs assistance Equipment used: Rolling walker (2 wheels) Transfers: Sit to/from Stand Sit to Stand: Min assist General transfer comment: Pt with multiple LOB upon standing requiring min A to steady; Performed x 4     Ambulation / Gait / Stairs / Wheelchair Mobility    Ambulation/Gait Ambulation/Gait assistance: Herbalist (Feet): 100 Feet Assistive device: Rolling walker (2 wheels) Gait Pattern/deviations: Step-through pattern, Decreased stride length General Gait Details: Pt initially unsteady with 4 LOB within 40' requiring min A to recover, did improve with last 60'.  Cues for RW use. Gait velocity: decreased     Posture / Balance Balance Overall balance assessment: Needs assistance Sitting-balance support: No upper extremity supported Sitting balance-Leahy Scale: Good Standing balance support: Bilateral upper extremity supported, No upper extremity supported Standing balance-Leahy Scale: Poor Standing balance comment: Had LOB with initial gait ; required RW; did improve  over session and able to work on standing balance without UE support High Level Balance Comments: Standing with min guard feet apart and feet together 30 sec, reaching 3" outside BOS min guard, turn circle min guard increased time.  Tried standing marching but needing min A at time 10 reps     Special needs/care consideration [name] states he is "slow" at baseline and likely illiterate. He is not [name]'s caregiver, he assists with household management/cleaning Not a daily drinker, but drinks a lot every couple of days    Previous Home Environment  Living Arrangements:  (lives with "cousin" [name])  Lives With: Other (Comment) Available Help at Discharge: Other (Comment) Type of Home: House Home Layout: One level Home Access: Stairs to enter Entrance Stairs-Rails: Right, Left Entrance Stairs-Number of Steps: 4 Bathroom Shower/Tub: Multimedia programmer: Handicapped height Bathroom Accessibility: Yes How Accessible: Accessible via walker [place]: No   Discharge Living Setting Plans for Discharge Living Setting: House, Lives with (comment) ("cousin" [name]) Type of Home at Discharge: House Discharge Home Layout: One level Discharge Home  Access: Stairs to enter Entrance Stairs-Rails: Right, Left Entrance Stairs-Number of Steps: 4 Discharge Bathroom Shower/Tub: Walk-in shower Discharge Bathroom Toilet: Handicapped height Discharge Bathroom Accessibility: Yes How Accessible: Accessible via walker Does the patient have any problems obtaining your medications?: No   Social/Family/Support Systems Contact Information: Oceanographer, "cousin" he has lived with her for 5 years Anticipated Caregiver: [name] Anticipated Ambulance person Information: see contacts Caregiver Availability: 24/7 Discharge Plan Discussed with Primary Caregiver: Yes Is Caregiver In Agreement with Plan?: Yes Does Caregiver/Family have Issues with Lodging/Transportation while Pt is in Rehab?: No   Goals Patient/Family Goal for Rehab: Mod I to supervision with PT, OT and SLP Expected length of stay: ELOS 7 to 10 days Additional Information: [name] states he is "slow". Likely can not read also Pt/Family Agrees to Admission and willing to participate: Yes Program Orientation Provided & Reviewed with Pt/Caregiver Including Roles  & Responsibilities: Yes   Decrease burden of Care through IP rehab admission: n/a   Possible need for SNF placement upon discharge: bot anticipated   Patient Condition: I have reviewed medical records from [facility], spoken with CM, and patient and family member. I met with patient at the bedside for inpatient rehabilitation assessment.  Patient will benefit from ongoing PT, OT, and SLP, can actively participate in 3 hours of therapy a day 5 days of the week, and can make measurable gains during the admission.  Patient will also benefit from the coordinated team approach during an Inpatient Acute Rehabilitation admission.  The patient will receive intensive therapy as well as Rehabilitation physician, nursing, social worker, and care management interventions.  Due to bladder management, bowel management, safety, skin/wound  care, disease management, medication administration, pain management, and patient education the patient requires 24 hour a day rehabilitation nursing.  The patient is currently min assist overall with mobility and basic ADLs.  Discharge setting and therapy post discharge at home with home health is anticipated.  Patient has agreed to participate in the Acute Inpatient Rehabilitation Program and will admit today.   Preadmission Screen Completed By:  Cleatrice Burke, 03/28/2022 10:17 AM ______________________________________________________________________   Discussed status with Dr. Naaman Plummer on 03/28/2022 at 23 and received approval for admission today.   Admission Coordinator:  Cleatrice Burke, RN, time 2800 Date 03/28/2022    Assessment/Plan: Diagnosis: right insular/corona radiata infarct Does the need for close, 24 hr/day Medical supervision in  concert with the patient's rehab needs make it unreasonable for this patient to be served in a less intensive setting? Yes Co-Morbidities requiring supervision/potential complications: HTN, etoh abuse, new a fib Due to bladder management, bowel management, safety, skin/wound care, disease management, medication administration, pain management, and patient education, does the patient require 24 hr/day rehab nursing? Yes Does the patient require coordinated care of a physician, rehab nurse, PT, OT, and SLP to address physical and functional deficits in the context of the above medical diagnosis(es)? Yes Addressing deficits in the following areas: balance, endurance, locomotion, strength, transferring, bowel/bladder control, bathing, dressing, feeding, grooming, toileting, cognition, speech, swallowing, and psychosocial support Can the patient actively participate in an intensive therapy program of at least 3 hrs of therapy 5 days a week? Yes The potential for patient to make measurable gains while on inpatient rehab is excellent Anticipated  functional outcomes upon discharge from inpatient rehab: supervision PT, supervision OT, supervision SLP Estimated rehab length of stay to reach the above functional goals is: 7-10 days Anticipated discharge destination: Home 10. Overall Rehab/Functional Prognosis: excellent     MD Signature: Meredith Staggers, MD, [place] Director Rehabilitation Services 03/28/2022          Revision History                      Note Details  Author Meredith Staggers, MD File Time 03/28/2022 10:25 AM  Author Type Physician Status Signed  Last Editor Meredith Staggers, MD Service Physical Medicine and [place] # [id number] Admit Date 03/28/2022

## 2022-03-29 DIAGNOSIS — I639 Cerebral infarction, unspecified: Secondary | ICD-10-CM | POA: Diagnosis not present

## 2022-03-29 LAB — CBC WITH DIFFERENTIAL/PLATELET
Abs Immature Granulocytes: 0.01 10*3/uL (ref 0.00–0.07)
Basophils Absolute: 0 10*3/uL (ref 0.0–0.1)
Basophils Relative: 1 %
Eosinophils Absolute: 0.1 10*3/uL (ref 0.0–0.5)
Eosinophils Relative: 2 %
HCT: 39.2 % (ref 39.0–52.0)
Hemoglobin: 13.6 g/dL (ref 13.0–17.0)
Immature Granulocytes: 0 %
Lymphocytes Relative: 22 %
Lymphs Abs: 1.4 10*3/uL (ref 0.7–4.0)
MCH: 34.1 pg — ABNORMAL HIGH (ref 26.0–34.0)
MCHC: 34.7 g/dL (ref 30.0–36.0)
MCV: 98.2 fL (ref 80.0–100.0)
Monocytes Absolute: 0.7 10*3/uL (ref 0.1–1.0)
Monocytes Relative: 11 %
Neutro Abs: 4.2 10*3/uL (ref 1.7–7.7)
Neutrophils Relative %: 64 %
Platelets: 258 10*3/uL (ref 150–400)
RBC: 3.99 MIL/uL — ABNORMAL LOW (ref 4.22–5.81)
RDW: 13.1 % (ref 11.5–15.5)
WBC: 6.5 10*3/uL (ref 4.0–10.5)
nRBC: 0 % (ref 0.0–0.2)

## 2022-03-29 LAB — COMPREHENSIVE METABOLIC PANEL
ALT: 16 U/L (ref 0–44)
AST: 26 U/L (ref 15–41)
Albumin: 3.4 g/dL — ABNORMAL LOW (ref 3.5–5.0)
Alkaline Phosphatase: 65 U/L (ref 38–126)
Anion gap: 11 (ref 5–15)
BUN: 18 mg/dL (ref 8–23)
CO2: 22 mmol/L (ref 22–32)
Calcium: 9.6 mg/dL (ref 8.9–10.3)
Chloride: 100 mmol/L (ref 98–111)
Creatinine, Ser: 1.09 mg/dL (ref 0.61–1.24)
GFR, Estimated: >60 mL/min (ref >60)
Glucose, Bld: 123 mg/dL — ABNORMAL HIGH (ref 70–99)
Potassium: 4.6 mmol/L (ref 3.5–5.1)
Sodium: 133 mmol/L — ABNORMAL LOW (ref 135–145)
Total Bilirubin: 1.5 mg/dL — ABNORMAL HIGH (ref 0.3–1.2)
Total Protein: 6.7 g/dL (ref 6.5–8.1)

## 2022-03-29 LAB — METHYLMALONIC ACID, SERUM: Methylmalonic Acid, Quantitative: 155 nmol/L (ref 0–378)

## 2022-03-29 NOTE — IPOC Note (Addendum)
Overall Plan of Care [facility]) Patient Details Name: Alexander Simmons MRN: [id number] DOB: 20-Jul-1956  Admitting Diagnosis: Acute cardioembolic stroke [facility])  Hospital Problems: Principal Problem:   Acute cardioembolic stroke [facility])     Functional Problem List: Nursing Behavior, Endurance, Medication Management, Nutrition, Pain, Perception, Skin Integrity  PT Balance  OT Balance, Cognition  SLP    TR         Basic ADL's: OT Grooming, Bathing, Dressing, Toileting     Advanced  ADL's: OT Light Housekeeping     Transfers: PT Bed Mobility, Bed to Chair, Administrator, arts, Toilet     Locomotion: PT Ambulation, Stairs     Additional Impairments: OT    SLP        TR      Anticipated Outcomes Item Anticipated Outcome  Self Feeding    Swallowing      Basic self-care  Independent  Toileting  Independent   Bathroom Transfers Independent  Bowel/Bladder  continent of bowel and bladder  Transfers  independent  Locomotion  independent  Communication     Cognition     Pain  paain lesws than or equal to 4/10  Safety/Judgment  free from falls/injury and making appropriate safety decisions   Therapy Plan: PT Intensity: Minimum of 1-2 x/day ,45 to 90 minutes PT Frequency: 5 out of 7 days PT Duration Estimated Length of Stay: 3 days OT Intensity: Minimum of 1-2 x/day, 45 to 90 minutes OT Frequency: 5 out of 7 days OT Duration/Estimated Length of Stay: 5-7 days     Team Interventions: Nursing Interventions Patient/Family Education, Disease Management/Prevention, Pain Management, Medication Management, Skin Care/Wound Management, Cognitive Remediation/Compensation, Discharge Planning  PT interventions Discharge planning, Ambulation/gait training, Functional mobility training, Psychosocial support, Therapeutic Activities, Visual/perceptual remediation/compensation, Therapeutic Exercise, Skin care/wound management, Neuromuscular re-education, Disease  management/prevention, Medical illustrator training, Cognitive remediation/compensation, DME/adaptive equipment instruction, Pain management, UE/LE Strength taining/ROM, UE/LE Coordination activities, Stair training, Patient/family education, Functional electrical stimulation, Community reintegration  OT Interventions Training and development officer, Patient/family education, Self Care/advanced ADL retraining, UE/LE Strength taining/ROM, Therapeutic Activities, Functional mobility training, Cognitive remediation/compensation, DME/adaptive equipment instruction, Visual/perceptual remediation/compensation, UE/LE Coordination activities, Neuromuscular re-education, Community reintegration  SLP Interventions    TR Interventions    SW/CM Interventions Disease Management/Prevention, Patient/Family Education, Psychosocial Support, Discharge Planning   Barriers to Discharge MD   balance  Nursing Decreased caregiver support, Incontinence    PT Lack of/limited family support    OT      SLP      SW Insurance for SNF coverage, Decreased caregiver support, Lack of/limited family support     Team Discharge Planning: Destination: PT-Home ,OT- Home , SLP-  Projected Follow-up: PT-24 hour supervision/assistance, Outpatient PT, OT-  None, SLP-  Projected Equipment Needs: PT-To be determined, OT- Tub/shower seat, SLP-  Equipment Details: PT- , OT-  Patient/family involved in discharge planning: PT- Patient,  OT-Patient, SLP-   MD ELOS: 7-10d Medical Rehab Prognosis:  Good Assessment: The patient has been admitted for CIR therapies with the diagnosis of cardioembolic stroke. The team will be addressing functional mobility, strength, stamina, balance, safety, adaptive techniques and equipment, self-care, bowel and bladder mgt, patient and caregiver education, balance and safety , BP management. Goals have been set at [place]. Anticipated discharge destination is Home with family support.  See Team Conference Notes  for weekly updates to the plan of care

## 2022-03-29 NOTE — Evaluation (Signed)
Physical Therapy Assessment and Plan  Patient Details  Name: Alexander Simmons MRN: [id number] Date of Birth: 07-18-1956  PT Diagnosis: Abnormality of gait and Muscle weakness Rehab Potential: Excellent ELOS: 3 days   Today's Date: 03/29/2022 PT Individual Time: 1300-1410 PT Individual Time Calculation (min): 70 min    Hospital Problem: Principal Problem:   Acute cardioembolic stroke [facility])   Past Medical History:  Past Medical History:  Diagnosis Date   Alcohol abuse    Allergy    Cor pulmonale ([place])    By echocardiography   Glaucoma    Humerus head fracture-left 2021   Hyperlipidemia    Hypertension    Small bowel obstruction ([place])    Syncope 12/24/2009   Attributed to use of narcotics plus alcohol   Tobacco abuse    Past Surgical History:  Past Surgical History:  Procedure Laterality Date   ABDOMINAL SURGERY     COLONOSCOPY N/A 01/28/2013   Procedure: COLONOSCOPY;  Surgeon: Rogene Houston, MD;  [facility];  Service: Endoscopy;  Laterality: N/A;  1225   IR ANGIO INTRA EXTRACRAN SEL COM CAROTID INNOMINATE BILAT MOD SED  03/25/2022   IR ANGIO VERTEBRAL SEL SUBCLAVIAN INNOMINATE UNI R MOD SED  03/27/2022   PILONIDAL CYST EXCISION  2005   Dr. Irving Shows   RADIOLOGY WITH ANESTHESIA N/A 03/25/2022   Procedure: IR WITH ANESTHESIA;  Surgeon: Radiologist, Medication, MD;  Location: [place];  Service: Radiology;  Laterality: N/A;   TOOTH EXTRACTION      Assessment & Plan Clinical Impression: Patient is a 66 year old male with history of HTN, glaucoma, cor pulmonale, tobacco and ETOH abus, learning disability(?);  who was evaluated at [facility] on 03/24/22 with reports of fall while taking out trash in am followed by slurred, left facial droop, multiple falls with onset of vomiting since the last fall.  UDS negative. ETOH level 181. CT head negative and CTA w/P head showed occlusion of right M2 branch with decreased area of perfusion in posterior R-MCA 53 ml and he received TNK. He  ws starated on IV thiamine and CIWA protocol and transferred to [facility] 7/31 for intervention. He underwent cerebral angio with was negative for filling defects or occlusions. with MRI/MRA brain done revealing early infarct right deep insula  and corona radiata as well as poor visualization of distal MCA branches with slow flow but no occlusion.  He was extubated without difficulty.    Repeat MRI/MRA brain 08/01 done revealing   new punctate infarcts in right frontal, left posterior frontal and right occipital lobe.  2D echo showed EF 55-60% with no aortic stenosis and moderate TVR. He was found to have A fib on 08/02 and Dr. [name] consulted for input. Cards reported patient with history of palpitations in the past with work up revealing revealing  PSVT and alcoholic CM and on BB without cardioembolic stroke. TSH WNL and she recommended resuming metoprolol in needed for rate control. ST evaluation revealed severe cognitive communication deficits with mild dysarthria and SLUMS score 15/30. PT/OT evals done revealing weakness, balance deficits with posterior leanas well as dizziness affecting ADLs and mobility. CIR recommended due to functional decline. Patient transferred to CIR on 03/28/2022 .   Patient currently requires supervision with mobility secondary to muscle weakness, decreased cardiorespiratoy endurance, and decreased standing balance and decreased balance strategies.  Prior to hospitalization, patient was independent  with mobility and lived with Family in a House home.  Home access is 3-4Stairs to enter.  Patient will benefit from skilled PT intervention to maximize safe functional mobility, minimize fall risk, and decrease caregiver burden for planned discharge home with intermittent assist.  Anticipate patient will benefit from follow up OP at discharge.  PT - End of Session Activity Tolerance: Tolerates 10 - 20 min activity with multiple rests Endurance Deficit: No PT Assessment Rehab  Potential (ACUTE/IP ONLY): Excellent PT Barriers to Discharge: Lack of/limited family support PT Patient demonstrates impairments in the following area(s): Balance PT Transfers Functional Problem(s): Bed Mobility;Bed to Chair;Car PT Locomotion Functional Problem(s): Ambulation;Stairs PT Plan PT Intensity: Minimum of 1-2 x/day ,45 to 90 minutes PT Frequency: 5 out of 7 days PT Duration Estimated Length of Stay: 3 days PT Treatment/Interventions: Discharge planning;Ambulation/gait training;Functional mobility training;Psychosocial support;Therapeutic Activities;Visual/perceptual remediation/compensation;Therapeutic Exercise;Skin care/wound management;Neuromuscular re-education;Disease management/prevention;Balance/vestibular training;Cognitive remediation/compensation;DME/adaptive equipment instruction;Pain management;UE/LE Strength taining/ROM;UE/LE Coordination activities;Stair training;Patient/family education;Functional electrical stimulation;Community reintegration PT Transfers Anticipated Outcome(s): independent PT Locomotion Anticipated Outcome(s): independent PT Recommendation Recommendations for Other Services: Vestibular eval Follow Up Recommendations: 24 hour supervision/assistance;Outpatient PT Patient destination: Home Equipment Recommended: To be determined   PT Evaluation Precautions/Restrictions Precautions Precautions: Fall Restrictions Weight Bearing Restrictions: No Pain Interference Pain Interference Pain Effect on Sleep: 0. Does not apply - I have not had any pain or hurting in the past 5 days Pain Interference with Therapy Activities: 0. Does not apply - I have not received rehabilitationtherapy in the past 5 days Pain Interference with Day-to-Day Activities: 1. Rarely or not at all Home Living/Prior [place] Available Help at Discharge: Available 24 hours/day Type of Home: House Home Access: Stairs to enter [employer] of Steps:  3-4 Entrance Stairs-Rails: Right;Left;Can reach both Home Layout: One level Bathroom Shower/Tub: Walk-in shower  Lives With: Family Prior Function Level of Independence: Independent with gait;Independent with basic ADLs;Independent with transfers  Able to Take Stairs?: Yes Driving: Yes Vocation: On disability Leisure: Hobbies-yes (Comment) (cooking, lawn care) Vision/Perception  Vision - History Ability to See in Adequate Light: 0 Adequate Perception Perception: Within Functional Limits Praxis Praxis: Intact  Cognition Overall Cognitive Status: Within Functional Limits for tasks assessed Arousal/Alertness: Awake/alert Orientation Level: Oriented X4 Sustained Attention: Appears intact Memory: Appears intact Safety/Judgment: Appears intact Sensation Sensation Light Touch: Appears Intact Hot/Cold: Not tested Proprioception: Appears Intact Stereognosis: Not tested Coordination Gross Motor Movements are Fluid and Coordinated: No Coordination and Movement Description: mild impairment with stepping strategies during dynamic movements Motor  Motor Motor: Within Functional Limits Motor - Skilled Clinical Observations: decreased balance reaction   Trunk/Postural Assessment  Cervical Assessment Cervical Assessment: Within Functional Limits Thoracic Assessment Thoracic Assessment: Within Functional Limits Lumbar Assessment Lumbar Assessment: Within Functional Limits Postural Control Postural Control: Within Functional Limits  Balance Balance Balance Assessed: Yes Standardized Balance Assessment Standardized Balance Assessment: Functional Gait Assessment Berg Balance Test Sit to Stand: Able to stand without using hands and stabilize independently Standing Unsupported: Able to stand safely 2 minutes Sitting with Back Unsupported but Feet Supported on Floor or Stool: Able to sit safely and securely 2 minutes Stand to Sit: Sits safely with minimal use of hands Transfers:  Able to transfer safely, minor use of hands Standing Unsupported with Eyes Closed: Able to stand 10 seconds safely Standing Ubsupported with Feet Together: Able to place feet together independently and stand 1 minute safely From Standing, Reach Forward with Outstretched Arm: Reaches forward but needs supervision From Standing Position, Pick up Object from Floor: Able to pick up shoe, needs supervision From Standing Position, Turn to Look Behind Over each  Shoulder: Looks behind one side only/other side shows less weight shift Turn 360 Degrees: Able to turn 360 degrees safely one side only in 4 seconds or less Standing Unsupported, Alternately Place Feet on Step/Stool: Able to complete >2 steps/needs minimal assist Standing Unsupported, One Foot in Front: Able to take small step independently and hold 30 seconds Standing on One Leg: Tries to lift leg/unable to hold 3 seconds but remains standing independently Total Score: 42 Functional Gait  Assessment Gait Level Surface: Walks 20 ft in less than 5.5 sec, no assistive devices, good speed, no evidence for imbalance, normal gait pattern, deviates no more than 6 in outside of the 12 in walkway width. Change in Gait Speed: Able to change speed, demonstrates mild gait deviations, deviates 6-10 in outside of the 12 in walkway width, or no gait deviations, unable to achieve a major change in velocity, or uses a change in velocity, or uses an assistive device. Gait with Horizontal Head Turns: Performs head turns smoothly with slight change in gait velocity (eg, minor disruption to smooth gait path), deviates 6-10 in outside 12 in walkway width, or uses an assistive device. Gait with Vertical Head Turns: Performs task with slight change in gait velocity (eg, minor disruption to smooth gait path), deviates 6 - 10 in outside 12 in walkway width or uses assistive device Gait and Pivot Turn: Pivot turns safely within 3 sec and stops quickly with no loss of  balance. Step Over Obstacle: Is able to step over one shoe box (4.5 in total height) but must slow down and adjust steps to clear box safely. May require verbal cueing. Gait with Narrow Base of Support: Ambulates 4-7 steps. Gait with Eyes Closed: Walks 20 ft, uses assistive device, slower speed, mild gait deviations, deviates 6-10 in outside 12 in walkway width. Ambulates 20 ft in less than 9 sec but greater than 7 sec. Ambulating Backwards: Walks 20 ft, no assistive devices, good speed, no evidence for imbalance, normal gait Steps: Alternating feet, no rail. Total Score: 22 Extremity Assessment  RUE Assessment RUE Assessment: Exceptions to [facility] Active Range of Motion (AROM) Comments: ~150 degrees General Strength Comments: 4-/5 LUE Assessment LUE Assessment: Exceptions to [facility] Active Range of Motion (AROM) Comments: ~90 degrees- preveious shoulder injury General Strength Comments: 3/5 RLE Assessment RLE Assessment: Within Functional Limits General Strength Comments: Grossly 4+/5 LLE Assessment LLE Assessment: Within Functional Limits General Strength Comments: Grossly 4+/5  Care Tool Care Tool Bed Mobility Roll left and right activity   Roll left and right assist level: Supervision/Verbal cueing    Sit to lying activity   Sit to lying assist level: Supervision/Verbal cueing    Lying to sitting on side of bed activity   Lying to sitting on side of bed assist level: the ability to move from lying on the back to sitting on the side of the bed with no back support.: Supervision/Verbal cueing     Care Tool Transfers Sit to stand transfer   Sit to stand assist level: Supervision/Verbal cueing    Chair/bed transfer   Chair/bed transfer assist level: Supervision/Verbal cueing     Toilet transfer   Assist Level: Contact Guard/Touching assist    Car transfer   Car transfer assist level: Contact Guard/Touching assist      Care Tool Locomotion Ambulation   Assist level:  Supervision/Verbal cueing Assistive device: No Device Max distance: 170f  Walk 10 feet activity   Assist level: Supervision/Verbal cueing Assistive device: No Device  Walk 50 feet with 2 turns activity   Assist level: Supervision/Verbal cueing Assistive device: No Device  Walk 150 feet activity   Assist level: Supervision/Verbal cueing Assistive device: No Device  Walk 10 feet on uneven surfaces activity   Assist level: Contact Guard/Touching assist    Stairs   Assist level: Contact Guard/Touching assist Stairs assistive device: 2 hand rails Max number of stairs: 12  Walk up/down 1 step activity   Walk up/down 1 step (curb) assist level: Contact Guard/Touching assist Walk up/down 1 step or curb assistive device: 2 hand rails  Walk up/down 4 steps activity   Walk up/down 4 steps assist level: Contact Guard/Touching assist Walk up/down 4 steps assistive device: 2 hand rails  Walk up/down 12 steps activity   Walk up/down 12 steps assist level: Contact Guard/Touching assist Walk up/down 12 steps assistive device: 2 hand rails  Pick up small objects from floor   Pick up small object from the floor assist level: Contact Guard/Touching assist    Wheelchair Is the patient using a wheelchair?: No          Wheel 50 feet with 2 turns activity      Wheel 150 feet activity        Refer to Care Plan for Long Term Goals  SHORT TERM GOAL WEEK 1 PT Short Term Goal 1 (Week 1): STG = LTG due to ELOS  Recommendations for other services: None   Skilled Therapeutic Intervention Mobility Transfers Transfers: Sit to Stand;Stand to Sit Sit to Stand: Supervision/Verbal cueing Stand to Sit: Supervision/Verbal cueing Transfer (Assistive device): None Locomotion  Gait Ambulation: Yes Gait Assistance: Supervision/Verbal cueing Gait Distance (Feet): 150 Feet Assistive device: None Gait Assistance Details: Verbal cues for precautions/safety Gait Gait: Yes Gait Pattern: Within  Functional Limits Gait Pattern: Step-through pattern;Within Functional Limits Stairs / Additional Locomotion Stairs: Yes Stairs Assistance: Contact Guard/Touching assist Stair Management Technique: Two rails Number of Stairs: 12 Height of Stairs: 6 Ramp: Contact Guard/touching assist Wheelchair Mobility Wheelchair Mobility: No   Skilled Intervention: Pt sitting in recliner to start - agreeable to PT evaluation. Initiated functional mobility as outlined above. Overall, pt requiring supervision to CGA for all mobility without AD. Pt was pleasant and cooperative throughout session, oriented x4.   Patient demonstrates increased fall risk as noted by score of   42/56 on Berg Balance Scale.  (<36= high risk for falls, close to 100%; 37-45 significant >80%; 46-51 moderate >50%; 52-55 lower >25%).  Patient demonstrates increased fall risk as noted by score of 22/30 on  Functional Gait Assessment.   <22/30 = predictive of falls, <20/30 = fall in 6 months, <18/30 = predictive of falls in PD MCID: 5 points stroke population, 4 points geriatric population (ANPTA Core Set of Outcome Measures for Adults with Neurologic Conditions, 2018)  Pt concluded session seated in recliner with chair alarm on, all needs met.   Discharge Criteria: Patient will be discharged from PT if patient refuses treatment 3 consecutive times without medical reason, if treatment goals not met, if there is a change in medical status, if patient makes no progress towards goals or if patient is discharged from hospital.  The above assessment, treatment plan, treatment alternatives and goals were discussed and mutually agreed upon: by patient  Alger Simons 03/29/2022, 3:38 PM

## 2022-03-29 NOTE — Progress Notes (Signed)
Inpatient Rehabilitation Care Coordinator Assessment and Plan Patient Details  Name: Alexander Simmons MRN: [id number] Date of Birth: 20-Aug-1956  Today's Date: 03/29/2022  Hospital Problems: Principal Problem:   Acute cardioembolic stroke [facility])  Past Medical History:  Past Medical History:  Diagnosis Date   Alcohol abuse    Allergy    Cor pulmonale ([place])    By echocardiography   Glaucoma    Humerus head fracture-left 2021   Hyperlipidemia    Hypertension    Small bowel obstruction ([place])    Syncope 12/24/2009   Attributed to use of narcotics plus alcohol   Tobacco abuse    Past Surgical History:  Past Surgical History:  Procedure Laterality Date   ABDOMINAL SURGERY     COLONOSCOPY N/A 01/28/2013   Procedure: COLONOSCOPY;  Surgeon: Rogene Houston, MD;  [facility];  Service: Endoscopy;  Laterality: N/A;  1225   IR ANGIO INTRA EXTRACRAN SEL COM CAROTID INNOMINATE BILAT MOD SED  03/25/2022   IR ANGIO VERTEBRAL SEL SUBCLAVIAN INNOMINATE UNI R MOD SED  03/27/2022   PILONIDAL CYST EXCISION  2005   Dr. Irving Shows   RADIOLOGY WITH ANESTHESIA N/A 03/25/2022   Procedure: IR WITH ANESTHESIA;  Surgeon: Radiologist, Medication, MD;  Location: [place];  Service: Radiology;  Laterality: N/A;   TOOTH EXTRACTION     Social History:  reports that he has never smoked. His smokeless tobacco use includes snuff and chew. He reports current alcohol use of about 20.0 standard drinks of alcohol per week. He reports that he does not use drugs.  Family / Support Systems Anticipated Caregiver: [name] (Cousin) Family Dynamics: support from cousin who patient reports he has lived with for 5 years  Social History Preferred language: English Religion: Educational psychologist - How often do you need to have someone help you when you read instructions, pamphlets, or other written material from your doctor or pharmacy?: Often Writes: Yes Employment Status: Unemployed    Abuse/Neglect Abuse/Neglect Assessment Can Be Completed: Yes Physical Abuse: Denies Verbal Abuse: Denies Sexual Abuse: Denies Exploitation of patient/patient's resources: Denies Self-Neglect: Denies  Patient response to: Social Isolation - How often do you feel lonely or isolated from those around you?: Never  Emotional Status Recent Psychosocial Issues: coping Psychiatric History: n/a Substance Abuse History: tobacco and alcohol use  Patient / Family Perceptions, Expectations & Goals Pt/Family understanding of illness & functional limitations: yes Premorbid pt/family roles/activities: Independent Anticipated changes in roles/activities/participation: anticipating supervision to MOD I goals Pt/family expectations/goals: Supervision  [place] Agencies: None Premorbid Home Care/DME Agencies: None Transportation available at discharge: Patient does not drive. Is the patient able to respond to transportation needs?: Yes In the past 12 months, has lack of transportation kept you from medical appointments or from getting medications?: No In the past 12 months, has lack of transportation kept you from meetings, work, or from getting things needed for daily living?: No Resource referrals recommended: Neuropsychology  Discharge Planning Living Arrangements: Other relatives Support Systems: Other relatives Type of Residence: Private residence Insurance Resources: Multimedia programmer (specify), Medicaid (specify county) Sports administrator) Financial Resources: Family Support Financial Screen Referred: No Living Expenses: Lives with family Money Management: Patient Does the patient have any problems obtaining your medications?: No Home Management: Independent Patient/Family Preliminary Plans: Patient plans to manage money and medications. Cousin able to assist if needed Care Coordinator Barriers to Discharge: Insurance for SNF coverage, Decreased caregiver support, Lack  of/limited family support Expected length of stay: 7-10 Days  Clinical Impression Sw met with patient introduced self and explained role. Patient plans to discharge back home with his cousin to assist. The patient report no steps to enter the home. No additional questions or concerns, sw will continue to follow up.   [name] 03/29/2022, 12:23 PM

## 2022-03-29 NOTE — Plan of Care (Signed)
Problem: RH Balance Goal: LTG: Patient will maintain dynamic sitting balance (OT) Description: LTG:  Patient will maintain dynamic sitting balance with assistance during activities of daily living (OT) Flowsheets (Taken 03/29/2022 1224) LTG: Pt will maintain dynamic sitting balance during ADLs with: Independent Goal: LTG Patient will maintain dynamic standing with ADLs (OT) Description: LTG:  Patient will maintain dynamic standing balance with assist during activities of daily living (OT)  Flowsheets (Taken 03/29/2022 1224) LTG: Pt will maintain dynamic standing balance during ADLs with: Independent   Problem: Sit to Stand Goal: LTG:  Patient will perform sit to stand in prep for activites of daily living with assistance level (OT) Description: LTG:  Patient will perform sit to stand in prep for activites of daily living with assistance level (OT) Flowsheets (Taken 03/29/2022 1224) LTG: PT will perform sit to stand in prep for activites of daily living with assistance level: Independent   Problem: RH Grooming Goal: LTG Patient will perform grooming w/assist,cues/equip (OT) Description: LTG: Patient will perform grooming with assist, with/without cues using equipment (OT) Flowsheets (Taken 03/29/2022 1224) LTG: Pt will perform grooming with assistance level of: Independent   Problem: RH Bathing Goal: LTG Patient will bathe all body parts with assist levels (OT) Description: LTG: Patient will bathe all body parts with assist levels (OT) Flowsheets (Taken 03/29/2022 1224) LTG: Pt will perform bathing with assistance level/cueing: Independent LTG: Position pt will perform bathing: Shower   Problem: RH Dressing Goal: LTG Patient will perform upper body dressing (OT) Description: LTG Patient will perform upper body dressing with assist, with/without cues (OT). Flowsheets (Taken 03/29/2022 1224) LTG: Pt will perform upper body dressing with assistance level of: Independent Goal: LTG Patient will  perform lower body dressing w/assist (OT) Description: LTG: Patient will perform lower body dressing with assist, with/without cues in positioning using equipment (OT) Flowsheets (Taken 03/29/2022 1224) LTG: Pt will perform lower body dressing with assistance level of: Independent   Problem: RH Toileting Goal: LTG Patient will perform toileting task (3/3 steps) with assistance level (OT) Description: LTG: Patient will perform toileting task (3/3 steps) with assistance level (OT)  Flowsheets (Taken 03/29/2022 1224) LTG: Pt will perform toileting task (3/3 steps) with assistance level: Independent   Problem: RH Vision Goal: RH LTG Vision Theme park manager) Flowsheets (Taken 03/29/2022 1224) LTG: Vision Goals: Pt will demonstrate improved scanning to L/R visual field during functional tasks   Problem: RH Functional Use of Upper Extremity Goal: LTG Patient will use RT/LT upper extremity as a (OT) Description: LTG: Patient will use right/left upper extremity as a stabilizer/gross assist/diminished/nondominant/dominant level with assist, with/without cues during functional activity (OT) Flowsheets (Taken 03/29/2022 1224) LTG: Use of upper extremity in functional activities: RUE as dominant level LTG: Pt will use upper extremity in functional activity with assistance level of: Independent   Problem: RH Light Housekeeping Goal: LTG Patient will perform light housekeeping w/assist (OT) Description: LTG: Patient will perform light housekeeping with assistance, with/without cues (OT). Flowsheets (Taken 03/29/2022 1224) LTG: Pt will perform light housekeeping with assistance level of: Independent LTG: Pt will perform light housekeeping w/level of: Ambulate without device   Problem: RH Toilet Transfers Goal: LTG Patient will perform toilet transfers w/assist (OT) Description: LTG: Patient will perform toilet transfers with assist, with/without cues using equipment (OT) Flowsheets (Taken 03/29/2022 1224) LTG: Pt will  perform toilet transfers with assistance level of: Independent   Problem: RH Tub/Shower Transfers Goal: LTG Patient will perform tub/shower transfers w/assist (OT) Description: LTG: Patient will perform tub/shower transfers with  assist, with/without cues using equipment (OT) Flowsheets (Taken 03/29/2022 1224) LTG: Pt will perform tub/shower stall transfers with assistance level of: Independent LTG: Pt will perform tub/shower transfers from: Walk in shower   Problem: RH Memory Goal: LTG Patient will demonstrate ability for day to day recall/carry over during activities of daily living with assistance level (OT) Description: LTG:  Patient will demonstrate ability for day to day recall/carry over during activities of daily living with assistance level (OT). Flowsheets (Taken 03/29/2022 1224) LTG:  Patient will demonstrate ability for day to day recall/carry over during activities of daily living with assistance level (OT): Independent

## 2022-03-29 NOTE — Progress Notes (Signed)
[facility] Individual Statement of Services  Patient Name:  Alexander Simmons  Date:  03/29/2022  Welcome to the [facility].  Our goal is to provide you with an individualized program based on your diagnosis and situation, designed to meet your specific needs.  With this comprehensive rehabilitation program, you will be expected to participate in at least 3 hours of rehabilitation therapies Monday-Friday, with modified therapy programming on the weekends.  Your rehabilitation program will include the following services:  Physical Therapy (PT), Occupational Therapy (OT), Speech Therapy (ST), 24 hour per day rehabilitation nursing, Therapeutic Recreaction (TR), Neuropsychology, Care Coordinator, Rehabilitation Medicine, Nutrition Services, Pharmacy Services, and Other  Weekly team conferences will be held on Wednesdays to discuss your progress.  Your Inpatient Rehabilitation Care Coordinator will talk with you frequently to get your input and to update you on team discussions.  Team conferences with you and your family in attendance may also be held.  Expected length of stay: 7-10 Days  Overall anticipated outcome: MOD I to Supervision  Depending on your progress and recovery, your program may change. Your Inpatient Rehabilitation Care Coordinator will coordinate services and will keep you informed of any changes. Your Inpatient Rehabilitation Care Coordinator's name and contact numbers are listed  below.  The following services may also be recommended but are not provided by the [facility]:   [place] will be made to provide these services after discharge if needed.  Arrangements include referral to agencies that provide these services.  Your insurance has been verified to be:  [employer] Your primary doctor is:  Tula Nakayama, MD  Pertinent information  will be shared with your doctor and your insurance company.  Inpatient Rehabilitation Care Coordinator:  Erlene Quan, [place] or [phone]  Information discussed with and copy given to patient by: [name], 03/29/2022, 9:50 AM

## 2022-03-29 NOTE — Progress Notes (Signed)
Greeted patient in bed. Patient awake and alert. Oriented to rehab and conference day. Verified that has a smart phone.  Provided education on CVA/ Afib (new), Eliquis, HTN, HLD, diet modifications, smoking cessation, reducing risk of fall.  Informed that all material was in video format so that can watch and listen except for the eliquis but nursing will educate on medications.  Handout on Eliquis is in binder as well.  Pt verbalized that he understood.  Informed that binder is for him to take home. All needs met.

## 2022-03-29 NOTE — Evaluation (Addendum)
Occupational Therapy Assessment and Plan  Patient Details  Name: Alexander Simmons MRN: [id number] Date of Birth: May 10, 1956  OT Diagnosis: ataxia, cognitive deficits, disturbance of vision, hemiplegia affecting dominant side, and muscle weakness (generalized) Rehab Potential: Rehab Potential (ACUTE ONLY): Good ELOS: 3-5 days   Today's Date: 03/29/2022 OT Individual Time: 1045-1200 OT Individual Time Calculation (min): 75 min     Hospital Problem: Principal Problem:   Acute cardioembolic stroke [facility])   Past Medical History:  Past Medical History:  Diagnosis Date   Alcohol abuse    Allergy    Cor pulmonale ([place])    By echocardiography   Glaucoma    Humerus head fracture-left 2021   Hyperlipidemia    Hypertension    Small bowel obstruction ([place])    Syncope 12/24/2009   Attributed to use of narcotics plus alcohol   Tobacco abuse    Past Surgical History:  Past Surgical History:  Procedure Laterality Date   ABDOMINAL SURGERY     COLONOSCOPY N/A 01/28/2013   Procedure: COLONOSCOPY;  Surgeon: Rogene Houston, MD;  [facility];  Service: Endoscopy;  Laterality: N/A;  1225   IR ANGIO INTRA EXTRACRAN SEL COM CAROTID INNOMINATE BILAT MOD SED  03/25/2022   IR ANGIO VERTEBRAL SEL SUBCLAVIAN INNOMINATE UNI R MOD SED  03/27/2022   PILONIDAL CYST EXCISION  2005   Dr. Irving Shows   RADIOLOGY WITH ANESTHESIA N/A 03/25/2022   Procedure: IR WITH ANESTHESIA;  Surgeon: Radiologist, Medication, MD;  Location: [facility];  Service: Radiology;  Laterality: N/A;   TOOTH EXTRACTION      Assessment & Plan Clinical Impression: Alexander Simmons is a 66 year old male with history of HTN, glaucoma, cor pulmonale, tobacco and ETOH abus, learning disability(?);  who was evaluated at [facility] on 03/24/22 with reports of fall while taking out trash in am followed by slurred, left facial droop, multiple falls with onset of vomiting since the last fall.  UDS negative. ETOH level 181. CT head negative and CTA w/P  head showed occlusion of right M2 branch with decreased area of perfusion in posterior R-MCA 53 ml and he received TNK. He ws starated on IV thiamine and CIWA protocol and transferred to [facility] 7/31 for intervention. He underwent cerebral angio with was negative for filling defects or occlusions. with MRI/MRA brain done revealing early infarct right deep insula  and corona radiata as well as poor visualization of distal MCA branches with slow flow but no occlusion.  He was extubated without difficulty.    Repeat MRI/MRA brain 08/01 done revealing   new punctate infarcts in right frontal, left posterior frontal and right occipital lobe.  2D echo showed EF 55-60% with no aortic stenosis and moderate TVR. He was found to have A fib on 08/02 and Dr. [name] consulted for input. Cards reported patient with history of palpitations in the past with work up revealing revealing  PSVT and alcoholic CM and on BB without cardioembolic stroke. TSH WNL and she recommended resuming metoprolol in needed for rate control. ST evaluation revealed severe cognitive communication deficits with mild dysarthria and SLUMS score 15/30. PT/OT evals done revealing weakness, balance deficits with posterior leanas well as dizziness affecting ADLs and mobility. CIR recommended due to functional decline.        Review of Systems  Constitutional:  Negative for chills and fever.  HENT:  Negative for hearing loss and tinnitus.   Eyes:  Negative for blurred vision.  Respiratory:  Negative for  cough and hemoptysis.   Cardiovascular:  Negative for chest pain and palpitations.  Gastrointestinal:  Negative for abdominal pain, constipation and nausea.  Genitourinary:  Negative for dysuria and urgency.  Musculoskeletal:  Negative for myalgias and neck pain.  Skin:  Negative for rash.  Neurological:  Positive for weakness. Negative for dizziness and headaches.  Psychiatric/Behavioral:  The patient does not have insomnia.       Patient  transferred to CIR on 03/28/2022 .    Patient currently requires supervision with basic self-care skills secondary to muscle weakness, impaired timing and sequencing, ataxia, and decreased coordination, decreased visual motor skills, decreased motor planning, decreased initiation, decreased problem solving, decreased safety awareness, and decreased memory, and decreased standing balance, decreased postural control, and decreased balance strategies.  Prior to hospitalization, patient could complete ADLs and IADLs independently.  Patient will benefit from skilled intervention to increase independence with basic self-care skills prior to discharge home with care partner.  Anticipate patient will require 24 hour supervision and no further OT follow recommended.  OT - End of Session Activity Tolerance: Tolerates 30+ min activity without fatigue Endurance Deficit: No OT Assessment Rehab Potential (ACUTE ONLY): Good OT Patient demonstrates impairments in the following area(s): Balance;Cognition OT Basic ADL's Functional Problem(s): Grooming;Bathing;Dressing;Toileting OT Advanced ADL's Functional Problem(s): Light Housekeeping OT Transfers Functional Problem(s): Tub/Shower;Toilet OT Plan OT Intensity: Minimum of 1-2 x/day, 45 to 90 minutes OT Frequency: 5 out of 7 days OT Duration/Estimated Length of Stay: 3-5 days OT Treatment/Interventions: Balance/vestibular training;Patient/family education;Self Care/advanced ADL retraining;UE/LE Strength taining/ROM;Therapeutic Activities;Functional mobility training;Cognitive remediation/compensation;DME/adaptive equipment instruction;Visual/perceptual remediation/compensation;UE/LE Coordination activities;Neuromuscular re-education;Community reintegration OT Basic Self-Care Anticipated Outcome(s): Independent OT Toileting Anticipated Outcome(s): Independent OT Bathroom Transfers Anticipated Outcome(s): Independent OT Recommendation Patient destination:  Home Follow Up Recommendations: None Equipment Recommended: Tub/shower seat   OT Evaluation Precautions/Restrictions  Precautions Precautions: Fall Restrictions Weight Bearing Restrictions: No General Chart Reviewed: Yes Pain Pain Assessment Pain Scale: 0-10 Pain Score: 0-No pain Home Living/Prior Functioning Home Living Living Arrangements: Other relatives Available Help at Discharge: Available 24 hours/day (aunt) Type of Home: House Home Access: Stairs to enter [employer] of Steps: 4 Entrance Stairs-Rails: Right, Left Home Layout: One level Bathroom Shower/Tub: Multimedia programmer: Handicapped height Bathroom Accessibility: Yes  Lives With: Family IADL History Education: high school Leisure and Hobbies: watch tv, mow the yard, play with his dog "Dash" Prior Function Level of Independence: Independent with basic ADLs, Independent with gait, Independent with homemaking with ambulation, Independent with transfers, Independent with homemaking with wheelchair Vocation: Retired Surveyor, mining Baseline Vision/History: 1 Wears glasses Ability to See in Adequate Light: 0 Adequate Patient Visual Report: No change from baseline Vision Assessment?: Yes Eye Alignment: Within Functional Limits Ocular Range of Motion: Within Functional Limits Tracking/Visual Pursuits: Decreased smoothness of horizontal tracking;Decreased smoothness of vertical tracking Convergence: Within functional limits Perception  Perception: Within Functional Limits Praxis Praxis: Intact Cognition Cognition Overall Cognitive Status: Within Functional Limits for tasks assessed Arousal/Alertness: Awake/alert Orientation Level: Person;Place;Situation Person: Oriented Place: Oriented Situation: Oriented Memory: Impaired Attention: Sustained Sustained Attention: Appears intact Awareness: Impaired Awareness Impairment: Intellectual impairment Problem Solving: Impaired Problem Solving  Impairment: Functional basic Executive Function: Organizing;Self Monitoring;Self Correcting;Sequencing Sequencing: Impaired Sequencing Impairment: Verbal basic Organizing: Impaired Organizing Impairment: Functional basic Self Monitoring: Impaired Self Monitoring Impairment: Functional basic Self Correcting: Impaired Self Correcting Impairment: Functional basic Brief Interview for Mental Status (BIMS) Repetition of Three Words (First Attempt): 3 Temporal Orientation: Year: Correct Temporal Orientation: Month: Accurate within 5 days Temporal Orientation: Day: Correct  Recall: "Sock": Yes, no cue required Recall: "Blue": Yes, after cueing ("a color") Recall: "Bed": Yes, no cue required BIMS Summary Score: 14 Sensation Sensation Light Touch: Appears Intact Hot/Cold: Not tested Proprioception: Appears Intact Stereognosis: Not tested Coordination Gross Motor Movements are Fluid and Coordinated: No Fine Motor Movements are Fluid and Coordinated: No Coordination and Movement Description: minimally delayed reaction Motor  Motor Motor: Within Functional Limits Motor - Skilled Clinical Observations: decreased balance reaction  Trunk/Postural Assessment  Cervical Assessment Cervical Assessment: Within Functional Limits Thoracic Assessment Thoracic Assessment: Exceptions to [facility] (rounded shoulders, L shoulder higher than R) Lumbar Assessment Lumbar Assessment: Within Functional Limits Postural Control Postural Control: Deficits on evaluation (delayed balance reaction)  Balance Balance Balance Assessed: Yes Static Sitting Balance Static Sitting - Balance Support: Feet supported Static Sitting - Level of Assistance: 7: Independent Dynamic Sitting Balance Dynamic Sitting - Balance Support: Feet supported Dynamic Sitting - Level of Assistance: 7: Independent Dynamic Sitting - Balance Activities: Reaching for objects Static Standing Balance Static Standing - Balance Support: No upper  extremity supported Static Standing - Level of Assistance: 5: Stand by assistance Dynamic Standing Balance Dynamic Standing - Balance Support: No upper extremity supported Dynamic Standing - Level of Assistance: 5: Stand by assistance (CGA) Extremity/Trunk Assessment RUE Assessment RUE Assessment: Exceptions to [facility] Active Range of Motion (AROM) Comments: ~150 degrees General Strength Comments: 4-/5 LUE Assessment LUE Assessment: Exceptions to [facility] Active Range of Motion (AROM) Comments: ~90 degrees- preveious shoulder injury General Strength Comments: 3/5  Care Tool Care Tool Self Care Eating   Eating Assist Level: Independent    Oral Care    Oral Care Assist Level: Supervision/Verbal cueing    Bathing   Body parts bathed by patient: Right arm;Left arm;Chest;Abdomen;Front perineal area;Buttocks;Right upper leg;Left upper leg;Right lower leg;Face;Left lower leg     Assist Level: Supervision/Verbal cueing    Upper Body Dressing(including orthotics)   What is the patient wearing?: Pull over shirt   Assist Level: Supervision/Verbal cueing    Lower Body Dressing (excluding footwear)   What is the patient wearing?: Pants;Underwear/pull up Assist for lower body dressing: Supervision/Verbal cueing    Putting on/Taking off footwear   What is the patient wearing?: Socks;Shoes Assist for footwear: Supervision/Verbal cueing       Care Tool Toileting Toileting activity   Assist for toileting: Supervision/Verbal cueing     Care Tool Bed Mobility Roll left and right activity   Roll left and right assist level: Supervision/Verbal cueing    Sit to lying activity   Sit to lying assist level: Supervision/Verbal cueing    Lying to sitting on side of bed activity   Lying to sitting on side of bed assist level: the ability to move from lying on the back to sitting on the side of the bed with no back support.: Supervision/Verbal cueing     Care Tool Transfers Sit to stand transfer    Sit to stand assist level: Supervision/Verbal cueing    Chair/bed transfer   Chair/bed transfer assist level: Supervision/Verbal cueing     Toilet transfer   Assist Level: Contact Guard/Touching assist     Care Tool Cognition  Expression of Ideas and Wants Expression of Ideas and Wants: 4. Without difficulty (complex and basic) - expresses complex messages without difficulty and with speech that is clear and easy to understand  Understanding Verbal and Non-Verbal Content Understanding Verbal and Non-Verbal Content: 4. Understands (complex and basic) - clear comprehension without cues or repetitions   Memory/Recall  Ability Memory/Recall Ability : Current season;Location of own room;That he or she is in a hospital/hospital unit   Refer to Care Plan for [place] 1 OT Short Term Goal 1 (Week 1): STG = LTG 2/2 LOS  Recommendations for other services: None    Skilled Therapeutic Intervention ADL ADL Eating: Independent Where Assessed-Eating: Bed level Grooming: Supervision/safety Where Assessed-Grooming: Standing at sink Upper Body Bathing: Supervision/safety Where Assessed-Upper Body Bathing: Shower Lower Body Bathing: Supervision/safety Where Assessed-Lower Body Bathing: Shower Upper Body Dressing: Supervision/safety Where Assessed-Upper Body Dressing: Chair Lower Body Dressing: Supervision/safety Where Assessed-Lower Body Dressing: Chair Toileting: Supervision/safety Where Assessed-Toileting: Glass blower/designer: Therapist, music Method: Counselling psychologist: Energy manager: Not assessed Social research officer, government: Close supervision Social research officer, government Method: Heritage manager: Other (comment) (BSC) Mobility  Transfers Sit to Stand: Supervision/Verbal cueing Stand to Sit: Supervision/Verbal cueing  Upon OT arrival, pt semi recumbent in bed reporting no pain and is  agreeable to OT session. OT evaluation initiated. Pt completes full shower ADL at the levels above and demonstrates decreased motor planning, dynamic standing balance, and problem solving. Pt functioning near his baseline but would benefit from OT services to achieve highest level of independence. At end of ADL, pt ambulates to main therapy gym with CGA and no AD and completes stand to sit transfer onto EOM with Supervision. While seated EOM, pt completes B UE exercises using 3lb dumbbell. Pt performs shoulder flex/ext, elbow flex/ext, shoulder abduct/adduct, chest press, overhead press, supination/pronation, wrist ext/flex. Pt with previous shoulder injury to L shoulder limiting ROM/strength.   Discharge Criteria: Patient will be discharged from OT if patient refuses treatment 3 consecutive times without medical reason, if treatment goals not met, if there is a change in medical status, if patient makes no progress towards goals or if patient is discharged from hospital.  The above assessment, treatment plan, treatment alternatives and goals were discussed and mutually agreed upon: by patient  Marvetta Gibbons 03/29/2022, 12:15 PM

## 2022-03-29 NOTE — Plan of Care (Signed)
  Problem: RH Balance Goal: LTG Patient will maintain dynamic standing balance (PT) Description: LTG:  Patient will maintain dynamic standing balance with assistance during mobility activities (PT) Flowsheets (Taken 03/29/2022 1347) LTG: Pt will maintain dynamic standing balance during mobility activities with:: Independent   Problem: Sit to Stand Goal: LTG:  Patient will perform sit to stand with assistance level (PT) Description: LTG:  Patient will perform sit to stand with assistance level (PT) Flowsheets (Taken 03/29/2022 1347) LTG: PT will perform sit to stand in preparation for functional mobility with assistance level: Independent   Problem: RH Bed Mobility Goal: LTG Patient will perform bed mobility with assist (PT) Description: LTG: Patient will perform bed mobility with assistance, with/without cues (PT). Flowsheets (Taken 03/29/2022 1347) LTG: Pt will perform bed mobility with assistance level of: Independent   Problem: RH Bed to Chair Transfers Goal: LTG Patient will perform bed/chair transfers w/assist (PT) Description: LTG: Patient will perform bed to chair transfers with assistance (PT). Flowsheets (Taken 03/29/2022 1347) LTG: Pt will perform Bed to Chair Transfers with assistance level: Independent   Problem: RH Car Transfers Goal: LTG Patient will perform car transfers with assist (PT) Description: LTG: Patient will perform car transfers with assistance (PT). Flowsheets (Taken 03/29/2022 1347) LTG: Pt will perform car transfers with assist:: Independent   Problem: RH Ambulation Goal: LTG Patient will ambulate in controlled environment (PT) Description: LTG: Patient will ambulate in a controlled environment, # of feet with assistance (PT). Flowsheets (Taken 03/29/2022 1347) LTG: Pt will ambulate in controlled environ  assist needed:: Independent LTG: Ambulation distance in controlled environment: 121f Goal: LTG Patient will ambulate in home environment (PT) Description: LTG:  Patient will ambulate in home environment, # of feet with assistance (PT). Flowsheets (Taken 03/29/2022 1347) LTG: Pt will ambulate in home environ  assist needed:: Independent LTG: Ambulation distance in home environment: 1046f  Problem: RH Stairs Goal: LTG Patient will ambulate up and down stairs w/assist (PT) Description: LTG: Patient will ambulate up and down # of stairs with assistance (PT) Flowsheets (Taken 03/29/2022 1347) LTG: Pt will ambulate up/down stairs assist needed:: Independent with assistive device LTG: Pt will  ambulate up and down number of stairs: 3

## 2022-03-29 NOTE — Progress Notes (Signed)
PROGRESS NOTE   Subjective/Complaints:  No issues overnite, oriented to person place and time   ROS- neg CP, SOB, N/V/D  Objective:   No results found. Recent Labs    03/29/22 0644  WBC 6.5  HGB 13.6  HCT 39.2  PLT 258   No results for input(s): "NA", "K", "CL", "CO2", "GLUCOSE", "BUN", "CREATININE", "CALCIUM" in the last 72 hours.  Intake/Output Summary (Last 24 hours) at 03/29/2022 0730 Last data filed at 03/29/2022 0500 Gross per 24 hour  Intake 760 ml  Output 2775 ml  Net -2015 ml        Physical Exam: Vital Signs Blood pressure 137/85, pulse 87, temperature 98 F (36.7 C), resp. rate 14, height '5\' 1"'$  (1.549 m), weight 65 kg, SpO2 99 %.   General: No acute distress Mood and affect are appropriate Heart: Regular rate and rhythm no rubs murmurs or extra sounds Lungs: Clear to auscultation, breathing unlabored, no rales or wheezes Abdomen: Positive bowel sounds, soft nontender to palpation, nondistended Extremities: No clubbing, cyanosis, or edema Skin: No evidence of breakdown, no evidence of rash Neurologic: Cranial nerves II through XII intact, motor strength is 5/5 in bilateral deltoid, bicep, tricep, grip, hip flexor, knee extensors, ankle dorsiflexor and plantar flexor Sensory exam normal sensation to light touch and proprioception in bilateral upper and lower extremities Cerebellar exam normal finger to nose to finger as well as heel to shin in bilateral upper and lower extremities Musculoskeletal: Full range of motion in all 4 extremities. No joint swelling   Assessment/Plan: 1. Functional deficits which require 3+ hours per day of interdisciplinary therapy in a comprehensive inpatient rehab setting. Physiatrist is providing close team supervision and 24 hour management of active medical problems listed below. Physiatrist and rehab team continue to assess barriers to discharge/monitor patient progress  toward functional and medical goals  Care Tool:  Bathing              Bathing assist       Upper Body Dressing/Undressing Upper body dressing        Upper body assist      Lower Body Dressing/Undressing Lower body dressing            Lower body assist       Toileting Toileting    Toileting assist Assist for toileting: Contact Guard/Touching assist     Transfers Chair/bed transfer  Transfers assist           Locomotion Ambulation   Ambulation assist              Walk 10 feet activity   Assist           Walk 50 feet activity   Assist           Walk 150 feet activity   Assist           Walk 10 feet on uneven surface  activity   Assist           Wheelchair     Assist               Wheelchair 50 feet with 2 turns activity    Assist  Wheelchair 150 feet activity     Assist          Blood pressure 137/85, pulse 87, temperature 98 F (36.7 C), resp. rate 14, height '5\' 1"'$  (1.549 m), weight 65 kg, SpO2 99 %.  Medical Problem List and Plan: 1. Functional deficits secondary to embolic right MCA infarcts involving insula and corona radiata             -patient may shower             -ELOS/Goals: 7-10 days, supervision to mod I goals with PT, OT, SLP 2.  Antithrombotics: -DVT/anticoagulation:  Pharmaceutical: Eliquis             -antiplatelet therapy: N/a 3. Pain Management: Tylenol prn.  4. Mood/Behavior/Sleep: LCSW to follow for evaluation and support.              -antipsychotic agents: N/A 5. Neuropsych/cognition: This patient is capable of making decisions on his own behalf. 6. Skin/Wound Care: Routine pressure relief measures.             --encourage appropriate nutrition 7. Fluids/Electrolytes/Nutrition:  Monitor I/O. Check CMET in am.  8. HTN: Monitor BP TID. Controlled without meds at this time.  9. AKI: Encourage fluid intake. Recheck renal status in am.      Latest Ref Rng & Units 03/26/2022    6:28 AM 03/26/2022    4:05 AM 03/25/2022    6:26 PM  BMP  Glucose 70 - 99 mg/dL 118   112   BUN 8 - 23 mg/dL 14   14   Creatinine 0.61 - 1.24 mg/dL 1.36   1.50   Sodium 135 - 145 mmol/L 136  138  141   Potassium 3.5 - 5.1 mmol/L 4.2  3.6  3.9   Chloride 98 - 111 mmol/L 104   102   CO2 22 - 32 mmol/L 21     Calcium 8.9 - 10.3 mg/dL 8.1       10 Hypocalcemia: Ionized calcium-1.08. Add calcium supplement 11. Leucocytosis: Monitor for signs of infection. Recheck CBC in am.    Latest Ref Rng & Units 03/29/2022    6:44 AM 03/26/2022    6:28 AM 03/26/2022    4:05 AM  CBC  WBC 4.0 - 10.5 K/uL 6.5  13.1    Hemoglobin 13.0 - 17.0 g/dL 13.6  11.3  12.9   Hematocrit 39.0 - 52.0 % 39.2  33.2  38.0   Platelets 150 - 400 K/uL 258  244      12. Vitamin B12 deficiency: Vit B12-170.  --Will add B12 supplement.  13. Chronic Vitamin D deficiency: Vit D-26.7. Add ergocalciferol for supplement.  14. Elevated Triglycerids-647:  Question statin v/s alternate 15. ETOH abuse: Reports intermittent use-->monitor for signs of withdrawal             --continue to educate on importance of cessation.  `           --continue folic acid and Vitamin B1.  16.  New onset A Fib now on Eliquis   LOS: 1 days A FACE TO FACE EVALUATION WAS PERFORMED  Charlett Blake 03/29/2022, 7:30 AM

## 2022-03-29 NOTE — Progress Notes (Signed)
Inpatient Rehabilitation  Patient information reviewed and entered into eRehab system by Tye Vigo M. Barret Esquivel, M.A., CCC/SLP, PPS Coordinator.  Information including medical coding, functional ability and quality indicators will be reviewed and updated through discharge.    

## 2022-03-30 NOTE — Progress Notes (Signed)
Physical Therapy Session Note  Patient Details  Name: Alexander Simmons MRN: [id number] Date of Birth: Jun 30, 1956  Today's Date: 03/30/2022 PT Individual Time: 4496-7591 PT Individual Time Calculation (min): 40 min   Short Term Goals: Week 1:  PT Short Term Goal 1 (Week 1): STG = LTG due to ELOS  Skilled Therapeutic Interventions/Progress Updates:   Pt was received seated up in recliner and agreeable to physical therapy. Pt transferred from sit>stand with gait belt independently and ambulated from room to gym with gait belt and supervision and no AD.  Home simulated laundry care performed with gait belt and supervision. Pt amb 317 feet carrying a laundry basket filled with towels that included pivots and turning. Stair negotiation performed 4 steps x 3 with laundry basket and reciprocal pattern with ascending and step to pattern with descending. Functional bending and reaching performed while loading washer and dryer. No LOB or assistance needed throughout laundry task and pt reporting no difficulty.   Pt then educated on fall safety with verbal education and visual demonstrating from SPT. Pt then performed correct technique for getting up from a fall with verbal cues and supervision. Pt reporting no difficulty.   Static and dynamic standing balance challenged on airex in // bars with gait belt and CGA. NBOS x 30 sec EO then EC with no UE support required, 2x20 sec tandem balance bilaterally performed with frequent single UE support (R>L), and high knee marching on airex x1 minute completed with frequent bilat. UE support due to pt being unsteady. Pt able to self correct all LOB. Concluded with NBOS on airex with ball toss in various directions x 20 with beach ball, x15 with kickball. No LOB occurred and pt demonstrated good turning and reaching ability while maintaining balance.  Pt walked back to room with gait belt and supervision. Left pt seated in recliner with all needs met. Pt permitted to  independently amb around room. Pt reports no c/o pain throughout session.   Therapy Documentation Precautions:  Precautions Precautions: Fall Restrictions Weight Bearing Restrictions: No      Therapy/Group: Individual Therapy  Kayleen Memos 03/30/2022, 3:13 PM

## 2022-03-30 NOTE — Progress Notes (Signed)
Occupational Therapy Session Note  Patient Details  Name: Alexander Simmons MRN: [id number] Date of Birth: 11-01-1955  Today's Date: 03/30/2022 OT Individual Time: 3016-0109 3235-5732 OT Individual Time Calculation (min): 60 min 45 min    Short Term Goals: Week 1:  OT Short Term Goal 1 (Week 1): STG = LTG 2/2 LOS  Skilled Therapeutic Interventions/Progress Updates:  1st Session: Pt awake in bed and already completed AM meal upon OT arrival. Pt eager and cooperative for all treatment. Reports he would like to use the toilet for a BM and shave this am. Pt moved supine to sit and amb to toilet and to sink all with gait belt and S only. Min cues for pacing. Pt able to perform toileting with set up only and had a BM and voided (see Flowsheets). Pt abole to wipe and manage LB garments with S. Seated and standing level self care this session sink side. Completed all bathing UB/LB in standing with sitting only to reach feet all with set up. UB and LB dressing including tennis shoes with fasteners with set up only. Pt stood for oral care and shaving routine with set up and S. Seated rest for brief interval prior to amb to ortho gym without AD ~ 75 ft x 2 and S. Pt tolerated NuStep 2 sets of 5 min on L2. Rest between sets. VSS and breathing instructions initiated for pacing. Once back in room, left up in recliner with chair pad alarm engaged, needs and nurse call button in place.    2nd Session: OT care coordination with PT to assess pt to progress to mod I in room. OT agrees with plan. Toileting this visit with + BM and voiding (see Flowsheets). OT established B UE HEP with yellow tband for scap, sh, elbow joints 10 reps x 2 sets and provided in writing for home carryover and indep to teach back with drawings to guide. Pt amb without AD and S only with OT to access off the unit to work on dynamic balance and cognitive and scanning skills in hospital gift shop. Able to locate 5/5 items without cues. Back up on  unit, OT addressed kitchen and light IADL tasks management in demo apt space with S only. Completed session by using shopping cart to simulate grocery shopping through all 3 therapy gym spaces with no LOB or fatigue. Once back in room, OT posted mod I document and alerted nurse and nurse tech of status. Left pt with all safety measures and needs within reach.   Therapy Documentation Precautions:  Precautions Precautions: Fall Restrictions Weight Bearing Restrictions: No   Therapy/Group: Individual Therapy  Barnabas Lister 03/30/2022, 7:44 AM

## 2022-03-30 NOTE — Progress Notes (Signed)
Physical Therapy Session Note  Patient Details  Name: Alexander Simmons MRN: [id number] Date of Birth: 1956/02/19  Today's Date: 03/30/2022 PT Individual Time: 1007-1045  PT Individual Time Calculation (min): 38 min  Short Term Goals: Week 1:  PT Short Term Goal 1 (Week 1): STG = LTG due to ELOS  Skilled Therapeutic Interventions/Progress Updates:    Pt received sitting in recliner and agreeable to therapy session. Sit<>stands, no AD, independently throughout session. Gait training >311f including on/off elevators to get outside, no AD, with supervision for safety - pt demonstrates no significant gait deviations and maintains a community ambulatory gait speed (based on rough estimation) and achieves full reciprocal step lengths. Pt uses L UE to select elevator buttons to promote NMR with no significant paresis or incoordination noticed.  Participated in outside gait training over paved concrete vs paved brick pathway and navigates up/down unlevel ramps and up/down 8 outdoor stairs x2 reps both with and without handrails with close supervision for safety with no instances of imbalance noticed. Pt able to perform stair navigation using reciprocal stepping pattern on ascent and step-to pattern on descent due to the steps having a larger than normal depth.  Educated pt to discuss with MD regarding whether or not his is allowed to drive. Educated pt on signs/symptoms of CVA - BE FAST.  Discussed safety with walking his dog after D/C.   Pt ambulated back up to CIR, no AD, with no instances of instability noted.  Pt participated in standing balance and L UE NMR tasks using the following BITs system activities with close supervision for balance:  - visual pursuit rotator sequence having average of 5.18sec reaction time and requiring 177m44sec to complete 20 numbered targets - tracing - maze Pt demos only slight difficulty with primarily overhead reaching tasks but this is due to prior shoulder injury and  not his CVA.   Discussed focusing afternoon session on simulating household tasks to prepare for likely D/C home on Monday 8/7. At end of session, pt left seated in recliner with needs in reach and chair alarm on.   Therapy Documentation Precautions:  Precautions Precautions: Fall Restrictions Weight Bearing Restrictions: No   Pain:  Denies pain during session.      Therapy/Group: Individual Therapy  CaTawana Scale PT, DPT, NCS, CSRS 03/30/2022, 8:02 AM

## 2022-03-30 NOTE — Discharge Instructions (Addendum)
Inpatient Rehab Discharge Instructions  Alexander Simmons Discharge date and time: 04/01/22   Activities/Precautions/ Functional Status: Activity: no lifting, driving, or strenuous exercise till cleared by MD Diet: low fat, low cholesterol diet. Drink plenty of water. (NO ALCOHOL.) Wound Care: none needed   Functional status:  ___ No restrictions      ___ Walk up steps independently ___ 24/7 supervision/assistance   ___ Walk up steps with assistance _X__ Intermittent supervision/assistance  _X__ Bathe/dress independently ___ Walk with walker     ___ Bathe/dress with assistance ___ Walk Independently     ___ Shower independently ___ Walk with assistance     ___ Shower with assistance _X__ No alcohol      ___ Return to work/school ________  Special Instructions:  STROKE/TIA DISCHARGE INSTRUCTIONS SMOKING Cigarette smoking nearly doubles your risk of having a stroke & is the single most alterable risk factor  If you smoke or have smoked in the last 12 months, you are advised to quit smoking for your health. Most of the excess cardiovascular risk related to smoking disappears within a year of stopping. Ask you doctor about anti-smoking medications  Quit Line: 1-800-QUIT NOW Free Smoking Cessation Classes [phone]  CHOLESTEROL Know your levels; limit fat & cholesterol in your diet  Lipid Panel     Component Value Date/Time   CHOL 145 03/26/2022 0628   CHOL 170 10/10/2021 0830   TRIG 60 03/28/2022 0215   HDL 37 (L) 03/26/2022 0628   HDL 63 10/10/2021 0830   CHOLHDL 3.9 03/26/2022 0628   VLDL UNABLE TO CALCULATE IF TRIGLYCERIDE OVER 400 mg/dL 03/26/2022 0628   LDLCALC UNABLE TO CALCULATE IF TRIGLYCERIDE OVER 400 mg/dL 03/26/2022 0628   LDLCALC 82 10/10/2021 0830   LDLCALC 68 05/17/2020 1444     Many patients benefit from treatment even if their cholesterol is at goal. Goal: Total Cholesterol (CHOL) less than 160 Goal:  Triglycerides (TRIG) less than 150 Goal:   HDL greater than 40 Goal:  LDL (LDLCALC) less than 100   BLOOD PRESSURE American Stroke Association blood pressure target is less that 120/80 mm/Hg  Your discharge blood pressure is:  BP: 108/86 Monitor your blood pressure Limit your salt and alcohol intake Many individuals will require more than one medication for high blood pressure  DIABETES (A1c is a blood sugar average for last 3 months) Goal HGBA1c is under 7% (HBGA1c is blood sugar average for last 3 months)  Diabetes: No known diagnosis of diabetes    Lab Results  Component Value Date   HGBA1C 5.3 03/26/2022    Your HGBA1c can be lowered with medications, healthy diet, and exercise. Check your blood sugar as directed by your physician Call your physician if you experience unexplained or low blood sugars.  PHYSICAL ACTIVITY/REHABILITATION Goal is 30 minutes at least 4 days per week  Activity: No driving, Therapies: see above Return to work: N/A Activity decreases your risk of heart attack and stroke and makes your heart stronger.  It helps control your weight and blood pressure; helps you relax and can improve your mood. Participate in a regular exercise program. Talk with your doctor about the best form of exercise for you (dancing, walking, swimming, cycling).  DIET/WEIGHT Goal is to maintain a healthy weight  Your discharge diet is:  Diet Order             Diet Heart Room service appropriate? Yes with Assist; Fluid consistency: Thin  Diet effective  now                   liquids Your height is:  Height: '5\' 1"'$  (154.9 cm) Your current weight is: 143 Your Body Mass Index (BMI) is:  BMI (Calculated): 27.09 Following the type of diet specifically designed for you will help prevent another stroke. Your goal weight is 132 lbs  Your goal Body Mass Index (BMI) is 19-24. Healthy food habits can help reduce 3 risk factors for stroke:  High cholesterol, hypertension, and excess weight.  RESOURCES Stroke/Support Group:  Call  [phone]   STROKE EDUCATION PROVIDED/REVIEWED AND GIVEN TO PATIENT Stroke warning signs and symptoms How to activate emergency medical system (call 911). Medications prescribed at discharge. Need for follow-up after discharge. Personal risk factors for stroke. Pneumonia vaccine given:  Flu vaccine given:  My questions have been answered, the writing is legible, and I understand these instructions.  I will adhere to these goals & educational materials that have been provided to me after my discharge from the hospital.      My questions have been answered and I understand these instructions. I will adhere to these goals and the provided educational materials after my discharge from the hospital.  Patient/Caregiver Signature _______________________________ Date __________  Clinician Signature _______________________________________ Date __________  Please bring this form and your medication list with you to all your follow-up doctor's appointments.  Information on my medicine - ELIQUIS (apixaban)  This medication education was reviewed with me or my healthcare representative as part of my discharge preparation.   Why was Eliquis prescribed for you? Eliquis was prescribed for you to reduce the risk of a blood clot forming that can cause a stroke if you have a medical condition called atrial fibrillation (a type of irregular heartbeat).  What do You need to know about Eliquis ? Take your Eliquis TWICE DAILY - one tablet in the morning and one tablet in the evening with or without food. If you have difficulty swallowing the tablet whole please discuss with your pharmacist how to take the medication safely.  Take Eliquis exactly as prescribed by your doctor and DO NOT stop taking Eliquis without talking to the doctor who prescribed the medication.  Stopping may increase your risk of developing a stroke.  Refill your prescription before you run out.  After discharge, you should have  regular check-up appointments with your healthcare provider that is prescribing your Eliquis.  In the future your dose may need to be changed if your kidney function or weight changes by a significant amount or as you get older.  What do you do if you miss a dose? If you miss a dose, take it as soon as you remember on the same day and resume taking twice daily.  Do not take more than one dose of ELIQUIS at the same time to make up a missed dose.  Important Safety Information A possible side effect of Eliquis is bleeding. You should call your healthcare provider right away if you experience any of the following: Bleeding from an injury or your nose that does not stop. Unusual colored urine (red or dark brown) or unusual colored stools (red or black). Unusual bruising for unknown reasons. A serious fall or if you hit your head (even if there is no bleeding).  Some medicines may interact with Eliquis and might increase your risk of bleeding or clotting while on Eliquis. To help avoid this, consult your healthcare provider or pharmacist prior to using  any new prescription or non-prescription medications, including herbals, vitamins, non-steroidal anti-inflammatory drugs (NSAIDs) and supplements.  This website has more information on Eliquis (apixaban): http://[email or web address].com/eliquis/home

## 2022-03-31 DIAGNOSIS — I639 Cerebral infarction, unspecified: Secondary | ICD-10-CM | POA: Diagnosis not present

## 2022-03-31 LAB — VITAMIN B1: Vitamin B1 (Thiamine): 118.4 nmol/L (ref 66.5–200.0)

## 2022-03-31 NOTE — Progress Notes (Signed)
PROGRESS NOTE   Subjective/Complaints:  Tongue bleeding a little this AM- bit tongue when had initial stroke.  Has stopped now.   ROS-  Pt denies SOB, abd pain, CP, N/V/C/D, and vision changes   Objective:   No results found. Recent Labs    03/29/22 0644  WBC 6.5  HGB 13.6  HCT 39.2  PLT 258   Recent Labs    03/29/22 0644  NA 133*  K 4.6  CL 100  CO2 22  GLUCOSE 123*  BUN 18  CREATININE 1.09  CALCIUM 9.6    Intake/Output Summary (Last 24 hours) at 03/31/2022 1233 Last data filed at 03/31/2022 0700 Gross per 24 hour  Intake 476 ml  Output 600 ml  Net -124 ml        Physical Exam: Vital Signs Blood pressure 104/70, pulse (!) 57, temperature 98.6 F (37 C), temperature source Oral, resp. rate 18, height '5\' 1"'$  (1.549 m), weight 65 kg, SpO2 98 %.    General: awake, alert, appropriate, NAD HENT: conjugate gaze; oropharynx moist- small hematoma at tip of tongue CV: regular rate; no JVD Pulmonary: CTA B/L; no W/R/R- good air movement GI: soft, NT, ND, (+)BS Psychiatric: appropriate Neurological: alert Skin: No evidence of breakdown, no evidence of rash Neurologic: Cranial nerves II through XII intact, motor strength is 5/5 in bilateral deltoid, bicep, tricep, grip, hip flexor, knee extensors, ankle dorsiflexor and plantar flexor Sensory exam normal sensation to light touch and proprioception in bilateral upper and lower extremities Cerebellar exam normal finger to nose to finger as well as heel to shin in bilateral upper and lower extremities Musculoskeletal: Full range of motion in all 4 extremities. No joint swelling   Assessment/Plan: 1. Functional deficits which require 3+ hours per day of interdisciplinary therapy in a comprehensive inpatient rehab setting. Physiatrist is providing close team supervision and 24 hour management of active medical problems listed below. Physiatrist and rehab team  continue to assess barriers to discharge/monitor patient progress toward functional and medical goals  Care Tool:  Bathing    Body parts bathed by patient: Right arm, Left arm, Chest, Abdomen, Front perineal area, Buttocks, Right upper leg, Left upper leg, Right lower leg, Face, Left lower leg         Bathing assist Assist Level: Set up assist     Upper Body Dressing/Undressing Upper body dressing   What is the patient wearing?: Pull over shirt    Upper body assist Assist Level: Independent    Lower Body Dressing/Undressing Lower body dressing      What is the patient wearing?: Pants, Underwear/pull up     Lower body assist Assist for lower body dressing: Set up assist     Toileting Toileting    Toileting assist Assist for toileting: Set up assist     Transfers Chair/bed transfer  Transfers assist     Chair/bed transfer assist level: Supervision/Verbal cueing     Locomotion Ambulation   Ambulation assist      Assist level: Supervision/Verbal cueing Assistive device: No Device Max distance: 368f   Walk 10 feet activity   Assist     Assist level: Independent Assistive device: No  Device   Walk 50 feet activity   Assist    Assist level: Independent Assistive device: No Device    Walk 150 feet activity   Assist    Assist level: Supervision/Verbal cueing Assistive device: No Device    Walk 10 feet on uneven surface  activity   Assist     Assist level: Supervision/Verbal cueing     Wheelchair     Assist Is the patient using a wheelchair?: No             Wheelchair 50 feet with 2 turns activity    Assist            Wheelchair 150 feet activity     Assist          Blood pressure 104/70, pulse (!) 57, temperature 98.6 F (37 C), temperature source Oral, resp. rate 18, height '5\' 1"'$  (1.549 m), weight 65 kg, SpO2 98 %.  Medical Problem List and Plan: 1. Functional deficits secondary to embolic  right MCA infarcts involving insula and corona radiata             -patient may shower             -ELOS/Goals: 7-10 days, supervision to mod I goals with PT, OT, SLP  Continue CIR- PT, OT and SLP  2.  Antithrombotics: -DVT/anticoagulation:  Pharmaceutical: Eliquis             -antiplatelet therapy: N/a 3. Pain Management: Tylenol prn.  4. Mood/Behavior/Sleep: LCSW to follow for evaluation and support.              -antipsychotic agents: N/A 5. Neuropsych/cognition: This patient is capable of making decisions on his own behalf. 6. Skin/Wound Care: Routine pressure relief measures.             --encourage appropriate nutrition 7. Fluids/Electrolytes/Nutrition:  Monitor I/O. Check CMET in am.  8. HTN: Monitor BP TID. Controlled without meds at this time.  9. AKI: Encourage fluid intake. Recheck renal status in am.   8/6- has improved- con't regimen    Latest Ref Rng & Units 03/29/2022    6:44 AM 03/26/2022    6:28 AM 03/26/2022    4:05 AM  BMP  Glucose 70 - 99 mg/dL 123  118    BUN 8 - 23 mg/dL 18  14    Creatinine 0.61 - 1.24 mg/dL 1.09  1.36    Sodium 135 - 145 mmol/L 133  136  138   Potassium 3.5 - 5.1 mmol/L 4.6  4.2  3.6   Chloride 98 - 111 mmol/L 100  104    CO2 22 - 32 mmol/L 22  21    Calcium 8.9 - 10.3 mg/dL 9.6  8.1      10 Hypocalcemia: Ionized calcium-1.08. Add calcium supplement 11. Leucocytosis: Monitor for signs of infection. Recheck CBC in am.  8/6- has resolved- con't to monitor    Latest Ref Rng & Units 03/29/2022    6:44 AM 03/26/2022    6:28 AM 03/26/2022    4:05 AM  CBC  WBC 4.0 - 10.5 K/uL 6.5  13.1    Hemoglobin 13.0 - 17.0 g/dL 13.6  11.3  12.9   Hematocrit 39.0 - 52.0 % 39.2  33.2  38.0   Platelets 150 - 400 K/uL 258  244      12. Vitamin B12 deficiency: Vit B12-170.  --Will add B12 supplement.  13. Chronic Vitamin D deficiency: Vit D-26.7. Add ergocalciferol for  supplement.  14. Elevated Triglycerids-647:  Question statin v/s alternate 15. ETOH abuse:  Reports intermittent use-->monitor for signs of withdrawal             --continue to educate on importance of cessation.  `           --continue folic acid and Vitamin B1.  16.  New onset A Fib now on Eliquis   8/6- has some bleeding from tip of tongue- small amounts- likely due to eliquis- monitor for bleeding 17. Hematoma on Tongue  As per #16  LOS: 3 days A FACE TO FACE EVALUATION WAS PERFORMED  Alexander Simmons 03/31/2022, 12:33 PM

## 2022-04-01 ENCOUNTER — Other Ambulatory Visit (HOSPITAL_COMMUNITY): Payer: Self-pay

## 2022-04-01 DIAGNOSIS — N179 Acute kidney failure, unspecified: Secondary | ICD-10-CM | POA: Diagnosis not present

## 2022-04-01 DIAGNOSIS — D72829 Elevated white blood cell count, unspecified: Secondary | ICD-10-CM | POA: Diagnosis not present

## 2022-04-01 DIAGNOSIS — I639 Cerebral infarction, unspecified: Secondary | ICD-10-CM | POA: Diagnosis not present

## 2022-04-01 DIAGNOSIS — I10 Essential (primary) hypertension: Secondary | ICD-10-CM | POA: Diagnosis not present

## 2022-04-01 LAB — CBC
HCT: 41 % (ref 39.0–52.0)
Hemoglobin: 13.8 g/dL (ref 13.0–17.0)
MCH: 34.2 pg — ABNORMAL HIGH (ref 26.0–34.0)
MCHC: 33.7 g/dL (ref 30.0–36.0)
MCV: 101.5 fL — ABNORMAL HIGH (ref 80.0–100.0)
Platelets: 300 10*3/uL (ref 150–400)
RBC: 4.04 MIL/uL — ABNORMAL LOW (ref 4.22–5.81)
RDW: 13.2 % (ref 11.5–15.5)
WBC: 5.3 10*3/uL (ref 4.0–10.5)
nRBC: 0 % (ref 0.0–0.2)

## 2022-04-01 LAB — BASIC METABOLIC PANEL
Anion gap: 9 (ref 5–15)
BUN: 24 mg/dL — ABNORMAL HIGH (ref 8–23)
CO2: 25 mmol/L (ref 22–32)
Calcium: 9.6 mg/dL (ref 8.9–10.3)
Chloride: 101 mmol/L (ref 98–111)
Creatinine, Ser: 1.44 mg/dL — ABNORMAL HIGH (ref 0.61–1.24)
GFR, Estimated: 53 mL/min — ABNORMAL LOW (ref >60)
Glucose, Bld: 113 mg/dL — ABNORMAL HIGH (ref 70–99)
Potassium: 5 mmol/L (ref 3.5–5.1)
Sodium: 135 mmol/L (ref 135–145)

## 2022-04-01 MED ORDER — SODIUM CHLORIDE 0.9 % IV SOLN: INTRAVENOUS | Status: DC

## 2022-04-01 MED ORDER — CYANOCOBALAMIN 500 MCG PO TABS
500.0000 ug | ORAL_TABLET | Freq: Every day | ORAL | 0 refills | Status: DC
  Filled 2022-04-01: qty 30, 30d supply, fill #0

## 2022-04-01 MED ORDER — APIXABAN 5 MG PO TABS
5.0000 mg | ORAL_TABLET | Freq: Two times a day (BID) | ORAL | 1 refills | Status: DC
  Filled 2022-04-01: qty 60, 30d supply, fill #0

## 2022-04-01 MED ORDER — VITAMIN D (ERGOCALCIFEROL) 1.25 MG (50000 UNIT) PO CAPS
50000.0000 [IU] | ORAL_CAPSULE | ORAL | 0 refills | Status: DC
  Filled 2022-04-01: qty 5, 35d supply, fill #0

## 2022-04-01 MED ORDER — CALCIUM CITRATE 950 (200 CA) MG PO TABS
200.0000 mg | ORAL_TABLET | Freq: Two times a day (BID) | ORAL | 1 refills | Status: DC
  Filled 2022-04-01: qty 60, 30d supply, fill #0

## 2022-04-01 MED ORDER — PANTOPRAZOLE SODIUM 40 MG PO TBEC
40.0000 mg | DELAYED_RELEASE_TABLET | Freq: Every day | ORAL | 0 refills | Status: DC
  Filled 2022-04-01: qty 30, 30d supply, fill #0

## 2022-04-01 MED ORDER — FOLIC ACID 1 MG PO TABS
1.0000 mg | ORAL_TABLET | Freq: Every day | ORAL | 0 refills | Status: DC
  Filled 2022-04-01: qty 30, 30d supply, fill #0

## 2022-04-01 MED ORDER — THIAMINE HCL 100 MG PO TABS
100.0000 mg | ORAL_TABLET | Freq: Every day | ORAL | 0 refills | Status: DC
  Filled 2022-04-01: qty 30, 30d supply, fill #0

## 2022-04-01 MED ORDER — ADULT MULTIVITAMIN W/MINERALS CH
1.0000 | ORAL_TABLET | Freq: Every day | ORAL | 0 refills | Status: DC
  Filled 2022-04-01: qty 30, 30d supply, fill #0

## 2022-04-01 NOTE — Patient Care Conference (Signed)
Inpatient RehabilitationTeam Conference and Plan of Care Update Date: 04/01/2022   Time: 12:36 PM    Patient Name: Alexander Simmons      Medical Record Number: [id number]  Date of Birth: [birth year] Sex: Male         Room/Bed: 4M04C/4M04C-01 Payor Info: Payor: Theme park manager MEDICARE / Plan: Charmian Muff DUAL COMPLETE / Product Type: *No Product type* /    Admit Date/Time:  03/28/2022  2:22 PM  Primary Diagnosis:  Acute cardioembolic stroke [facility])  Hospital Problems: Principal Problem:   Acute cardioembolic stroke [facility])    Expected Discharge Date: Expected Discharge Date: 04/01/22  Team Members Present:       Current Status/Progress Goal Weekly Team Focus  Bowel/Bladder   Continent xs 2  Remain continent  Toilet every 2 hours to remain continent of bowel and bladder   Swallow/Nutrition/ Hydration             ADL's   independent overall  independent overall  educatoin, discharge   Mobility   independent - at goal level  independent  discharge planning   Communication             Safety/Cognition/ Behavioral Observations  Patient is A&O xs 4, independent in room, with no behavioral concerns  Remain independent in home  Continue to use safety equipment to ambulate inside and outside of home.   Pain   No pain  Continue to control pain.  Maintain pain control.   Skin   No skin issues  Remain free of pressure injuries or skin tears  Continue to be mobile to reduce risk of pressure injuries     Discharge Planning:  Discharging home with friend MOD I on 8/7   Team Discussion: Patient meeting goals and ready for discharge today Patient on target to meet rehab goals: yes, pt currently meeting all goals and MOD I in [place] and progress notes for long and short-term goals.   Revisions to Treatment Plan:  N/a  Teaching Needs: Medications, safety, gait training, etc.  Current Barriers to Discharge: none  Possible Resolutions to Barriers: Medication  education and family education.     Medical Summary               I attest that I was present, lead the team conference, and concur with the assessment and plan of the team.   Ernest Pine 04/01/2022, 12:36 PM

## 2022-04-01 NOTE — Progress Notes (Signed)
Physical Therapy Session Note  Patient Details  Name: Alexander Simmons MRN: [id number] Date of Birth: 12/08/1955  Today's Date: 04/01/2022 PT Individual Time: 8469-6295 PT Individual Time Calculation (min): 41 min   Short Term Goals: Week 1:  PT Short Term Goal 1 (Week 1): STG = LTG due to ELOS  Skilled Therapeutic Interventions/Progress Updates:     Pt received supine in bed and agrees to therapy. No complaint of pain. Pt performs bed mobility independently. Pt ambulates x300' to gym without AD, with age appropriate gait speed and without evidence of balance deficits. Pt completes Nustep for strength and endurance training. Pt completes x12:00 at workload of 7 with average steps per minute ~70. PT cues to complete full ROM.  Pt ambulates to main gym independently. Pt performs balance training in gym. Initially pt performs single leg step ups onto unstable surface of bosu ball to encourage ankle strategy and postural adjustment with unreliable somatosensory input. Pt requires modA overall for balance with cues for engage gluteal muscles. Pt then performs staggered stance trunk rotations with 5 lb medicine ball, with lead foot on bosu ball. Pt completes with CGA 2x20 with alternating staggered stance. Pt then performs x20 ball tosses into trampoline while standing with both feet on bosu ball, with PT providing minA/modA for stability. Pt ambulates back to room. Pt left supine with all needs within reach.  Therapy Documentation Precautions:  Precautions Precautions: None Restrictions Weight Bearing Restrictions: No    Therapy/Group: Individual Therapy  Breck Coons, PT, DPT 04/01/2022, 6:13 PM

## 2022-04-01 NOTE — Progress Notes (Signed)
Occupational Therapy Discharge Summary  Patient Details  Name: Alexander Simmons MRN: [id number] Date of Birth: 08/28/1955  Patient has met 58 of 13 long term goals due to improved activity tolerance, improved balance, postural control, ability to compensate for deficits, improved awareness, and improved coordination.  Pt made excellent progress with BADLs, IADLs, and functional tranfsers during this admission. Pt is independent for BADLs and IADLs. Pt ambulating wihtout AD to access toilet and shower. Pt stands in shower to complete bathing but has a seat at home to use if needed. Family has not been present for therapy sessions. Patient to discharge at overall Independent level.    Reasons goals not met: n/a  Recommendation:  No further OT needs beyond this CIR OT plan of care upon d/c.   Equipment: No equipment provided  Reasons for discharge: treatment goals met  Patient/family agrees with progress made and goals achieved: Yes  OT Discharge ADL ADL Eating: Independent Where Assessed-Eating: Chair Grooming: Independent Where Assessed-Grooming: Sitting at sink Upper Body Bathing: Independent Where Assessed-Upper Body Bathing: Shower Lower Body Bathing: Independent Where Assessed-Lower Body Bathing: Shower Upper Body Dressing: Independent Where Assessed-Upper Body Dressing: Chair Lower Body Dressing: Independent Where Assessed-Lower Body Dressing: Chair Toileting: Independent Where Assessed-Toileting: Glass blower/designer: Programmer, applications Method: Counselling psychologist: Energy manager: Not assessed Social research officer, government: IT consultant Method: Heritage manager: Other (comment) (BSC) Vision Baseline Vision/History: 1 Wears glasses Patient Visual Report: No change from baseline Vision Assessment?: No apparent visual deficits Visual Fields: No apparent deficits Perception  Perception:  Within Functional Limits Praxis Praxis: Intact Cognition Cognition Overall Cognitive Status: Within Functional Limits for tasks assessed Arousal/Alertness: Awake/alert Orientation Level: Person;Place;Situation Memory: Appears intact Attention: Sustained Sustained Attention: Appears intact Awareness: Appears intact Problem Solving: Appears intact Problem Solving Impairment: Functional basic Safety/Judgment: Appears intact Brief Interview for Mental Status (BIMS) Repetition of Three Words (First Attempt): 3 Temporal Orientation: Year: Correct Temporal Orientation: Month: Accurate within 5 days Temporal Orientation: Day: Correct Recall: "Sock": Yes, no cue required Recall: "Blue": Yes, no cue required Recall: "Bed": Yes, no cue required BIMS Summary Score: 15 Sensation Sensation Light Touch: Appears Intact Hot/Cold: Appears Intact Proprioception: Appears Intact Stereognosis: Not tested Coordination Gross Motor Movements are Fluid and Coordinated: Yes Fine Motor Movements are Fluid and Coordinated: Yes Motor  Motor Motor: Within Functional Limits Mobility  Bed Mobility Bed Mobility: Rolling Right;Rolling Left;Supine to Sit;Sit to Supine Rolling Right: Independent Rolling Left: Independent Supine to Sit: Independent Sit to Supine: Independent Transfers Sit to Stand: Independent Stand to Sit: Independent  Trunk/Postural Assessment  Cervical Assessment Cervical Assessment: Within Functional Limits Thoracic Assessment Thoracic Assessment: Within Functional Limits Lumbar Assessment Lumbar Assessment: Within Functional Limits Postural Control Postural Control: Within Functional Limits  Balance Balance Balance Assessed: Yes Standardized Balance Assessment Standardized Balance Assessment: Berg Balance Test;Functional Gait Assessment Berg Balance Test Sit to Stand: Able to stand without using hands and stabilize independently Standing Unsupported: Able to stand safely  2 minutes Sitting with Back Unsupported but Feet Supported on Floor or Stool: Able to sit safely and securely 2 minutes Stand to Sit: Sits safely with minimal use of hands Transfers: Able to transfer safely, minor use of hands Standing Unsupported with Eyes Closed: Able to stand 10 seconds safely Standing Ubsupported with Feet Together: Able to place feet together independently and stand 1 minute safely From Standing, Reach Forward with Outstretched Arm: Can reach forward >12 cm safely (5") From Standing Position, Pick  up Object from Floor: Able to pick up shoe safely and easily From Standing Position, Turn to Look Behind Over each Shoulder: Looks behind from both sides and weight shifts well Turn 360 Degrees: Able to turn 360 degrees safely in 4 seconds or less Standing Unsupported, Alternately Place Feet on Step/Stool: Able to stand independently and safely and complete 8 steps in 20 seconds Standing Unsupported, One Foot in Front: Able to place foot tandem independently and hold 30 seconds Standing on One Leg: Able to lift leg independently and hold 5-10 seconds Total Score: 54 Static Sitting Balance Static Sitting - Balance Support: Feet supported Static Sitting - Level of Assistance: 7: Independent Dynamic Sitting Balance Dynamic Sitting - Balance Support: During functional activity Dynamic Sitting - Level of Assistance: 7: Independent Functional Gait  Assessment Gait assessed : Yes Gait Level Surface: Walks 20 ft in less than 5.5 sec, no assistive devices, good speed, no evidence for imbalance, normal gait pattern, deviates no more than 6 in outside of the 12 in walkway width. Change in Gait Speed: Able to smoothly change walking speed without loss of balance or gait deviation. Deviate no more than 6 in outside of the 12 in walkway width. Gait with Horizontal Head Turns: Performs head turns smoothly with slight change in gait velocity (eg, minor disruption to smooth gait path), deviates  6-10 in outside 12 in walkway width, or uses an assistive device. Gait with Vertical Head Turns: Performs task with slight change in gait velocity (eg, minor disruption to smooth gait path), deviates 6 - 10 in outside 12 in walkway width or uses assistive device Gait and Pivot Turn: Pivot turns safely within 3 sec and stops quickly with no loss of balance. Step Over Obstacle: Is able to step over one shoe box (4.5 in total height) without changing gait speed. No evidence of imbalance. Gait with Narrow Base of Support: Ambulates 4-7 steps. Gait with Eyes Closed: Walks 20 ft, no assistive devices, good speed, no evidence of imbalance, normal gait pattern, deviates no more than 6 in outside 12 in walkway width. Ambulates 20 ft in less than 7 sec. Ambulating Backwards: Walks 20 ft, no assistive devices, good speed, no evidence for imbalance, normal gait Steps: Alternating feet, no rail. Total Score: 25 Extremity/Trunk Assessment RUE Assessment RUE Assessment: Exceptions to [facility] Active Range of Motion (AROM) Comments: ~150 degrees General Strength Comments: 4-/5 LUE Assessment LUE Assessment: Exceptions to [facility] Active Range of Motion (AROM) Comments: ~90 degrees- preveious shoulder injury General Strength Comments: 3+/5   Leroy Libman 04/01/2022, 11:35 AM

## 2022-04-01 NOTE — Progress Notes (Signed)
Patient ID: Alexander Simmons, male   DOB: Feb 23, 1956, 65 y.o.   MRN: [id number]  SW informed patient d/c home today with no DME or therapy follow up.

## 2022-04-01 NOTE — Progress Notes (Signed)
PROGRESS NOTE   Subjective/Complaints:  No new concerns this AM.  Scr elevated today.  ROS-  Pt denies SOB, abd pain, cough, CP, N/V/C/D, and vision changes   Objective:   No results found. Recent Labs    04/01/22 0553  WBC 5.3  HGB 13.8  HCT 41.0  PLT 300    Recent Labs    04/01/22 0553  NA 135  K 5.0  CL 101  CO2 25  GLUCOSE 113*  BUN 24*  CREATININE 1.44*  CALCIUM 9.6     Intake/Output Summary (Last 24 hours) at 04/01/2022 1111 Last data filed at 04/01/2022 0744 Gross per 24 hour  Intake 691 ml  Output 400 ml  Net 291 ml         Physical Exam: Vital Signs Blood pressure 108/86, pulse 75, temperature 98.5 F (36.9 C), temperature source Oral, resp. rate 18, height '5\' 1"'$  (1.549 m), weight 65 kg, SpO2 100 %.    General: awake, alert, appropriate, NAD HENT: conjugate gaze; oropharynx moist- small hematoma at tip of tongue CV: regular rate; no JVD Pulmonary: CTA B/L; no W/R/R- good air movement GI: soft, NT, ND, (+)BS Psychiatric: appropriate, pleasant Neurological: alert Skin: No evidence of breakdown, no evidence of rash Neurologic: Cranial nerves II through XII intact, motor strength is 5/5 in bilateral deltoid, bicep, tricep, grip, hip flexor, knee extensors, ankle dorsiflexor and plantar flexor Sensory exam normal sensation to light touch and proprioception in bilateral upper and lower extremities Cerebellar exam normal finger to nose to finger as well as heel to shin in bilateral upper and lower extremities Musculoskeletal: Full range of motion in all 4 extremities. No joint swelling   Assessment/Plan: 1. Functional deficits which require 3+ hours per day of interdisciplinary therapy in a comprehensive inpatient rehab setting. Physiatrist is providing close team supervision and 24 hour management of active medical problems listed below. Physiatrist and rehab team continue to assess  barriers to discharge/monitor patient progress toward functional and medical goals  Care Tool:  Bathing    Body parts bathed by patient: Right arm, Left arm, Chest, Abdomen, Front perineal area, Buttocks, Right upper leg, Left upper leg, Right lower leg, Face, Left lower leg         Bathing assist Assist Level: Set up assist     Upper Body Dressing/Undressing Upper body dressing   What is the patient wearing?: Pull over shirt    Upper body assist Assist Level: Independent    Lower Body Dressing/Undressing Lower body dressing      What is the patient wearing?: Pants, Underwear/pull up     Lower body assist Assist for lower body dressing: Set up assist     Toileting Toileting    Toileting assist Assist for toileting: Set up assist     Transfers Chair/bed transfer  Transfers assist     Chair/bed transfer assist level: Supervision/Verbal cueing     Locomotion Ambulation   Ambulation assist      Assist level: Supervision/Verbal cueing Assistive device: No Device Max distance: 374f   Walk 10 feet activity   Assist     Assist level: Independent Assistive device: No Device   Walk  50 feet activity   Assist    Assist level: Independent Assistive device: No Device    Walk 150 feet activity   Assist    Assist level: Supervision/Verbal cueing Assistive device: No Device    Walk 10 feet on uneven surface  activity   Assist     Assist level: Supervision/Verbal cueing     Wheelchair     Assist Is the patient using a wheelchair?: No             Wheelchair 50 feet with 2 turns activity    Assist            Wheelchair 150 feet activity     Assist          Blood pressure 108/86, pulse 75, temperature 98.5 F (36.9 C), temperature source Oral, resp. rate 18, height '5\' 1"'$  (1.549 m), weight 65 kg, SpO2 100 %.  Medical Problem List and Plan: 1. Functional deficits secondary to embolic right MCA infarcts  involving insula and corona radiata             -patient may shower             -ELOS/Goals: 7-10 days, supervision to mod I goals with PT, OT, SLP  Continue CIR- PT, OT and SLP  -DC home tomorrow  2.  Antithrombotics: -DVT/anticoagulation:  Pharmaceutical: Eliquis             -antiplatelet therapy: N/a 3. Pain Management: Tylenol prn.  4. Mood/Behavior/Sleep: LCSW to follow for evaluation and support.              -antipsychotic agents: N/A 5. Neuropsych/cognition: This patient is capable of making decisions on his own behalf. 6. Skin/Wound Care: Routine pressure relief measures.             --encourage appropriate nutrition 7. Fluids/Electrolytes/Nutrition:  Monitor I/O. Check CMET in am.  8. HTN: Monitor BP TID. Controlled without meds at this time.   -8/7 well coontrolled 9. AKI: Encourage fluid intake. Recheck renal status in am.   8/6- has improved- con't regimen  -8/7 IVF started, recheck tomorrow    Latest Ref Rng & Units 04/01/2022    5:53 AM 03/29/2022    6:44 AM 03/26/2022    6:28 AM  BMP  Glucose 70 - 99 mg/dL 113  123  118   BUN 8 - 23 mg/dL '24  18  14   '$ Creatinine 0.61 - 1.24 mg/dL 1.44  1.09  1.36   Sodium 135 - 145 mmol/L 135  133  136   Potassium 3.5 - 5.1 mmol/L 5.0  4.6  4.2   Chloride 98 - 111 mmol/L 101  100  104   CO2 22 - 32 mmol/L '25  22  21   '$ Calcium 8.9 - 10.3 mg/dL 9.6  9.6  8.1     10 Hypocalcemia: Ionized calcium-1.08. Add calcium supplement 11. Leucocytosis: Monitor for signs of infection. Recheck CBC in am.  8/6- has resolved- con't to monitor  8/7 WBC down to 5.3    Latest Ref Rng & Units 04/01/2022    5:53 AM 03/29/2022    6:44 AM 03/26/2022    6:28 AM  CBC  WBC 4.0 - 10.5 K/uL 5.3  6.5  13.1   Hemoglobin 13.0 - 17.0 g/dL 13.8  13.6  11.3   Hematocrit 39.0 - 52.0 % 41.0  39.2  33.2   Platelets 150 - 400 K/uL 300  258  244  12. Vitamin B12 deficiency: Vit B12-170.  --Will add B12 supplement.  13. Chronic Vitamin D deficiency: Vit D-26.7. Add  ergocalciferol for supplement.  14. Elevated Triglycerids-647:  Question statin v/s alternate 15. ETOH abuse: Reports intermittent use-->monitor for signs of withdrawal             --continue to educate on importance of cessation.  `           --continue folic acid and Vitamin B1.  16.  New onset A Fib now on Eliquis   8/6- has some bleeding from tip of tongue- small amounts- likely due to eliquis- monitor for bleeding 17. Hematoma on Tongue  As per #16  LOS: 4 days A FACE TO FACE EVALUATION WAS PERFORMED  Jennye Boroughs 04/01/2022, 11:11 AM

## 2022-04-01 NOTE — Progress Notes (Signed)
Occupational Therapy Session Note  Patient Details  Name: Alexander Simmons MRN: [id number] Date of Birth: 1956-05-02  Today's Date: 04/01/2022 OT Individual Time: 9450-3888 OT Individual Time Calculation (min): 56 min  and Today's Date: 04/01/2022 OT Missed Time: 19 Minutes Missed Time Reason: Patient fatigue   Short Term Goals: Week 1:  OT Short Term Goal 1 (Week 1): STG = LTG 2/2 LOS  Skilled Therapeutic Interventions/Progress Updates:    Pt resting in recliner upon arrival and agreeable to therapy. OT intervention with focus on funcitonal amb without AD, standing balance, discharge planning, and safety awareness. Pt discharging home following AM therapies. Pt independent for all ambulation/tranfsers. Pt completes simple home mgmt tasks independently. Independent for BADLs. Pt will be discharging home with aunt. Pt ready for dishcarge home. Pt missed 19 mins skilled OT services 2/2 fatigue.   Therapy Documentation Precautions:  Precautions Precautions: None Restrictions Weight Bearing Restrictions: No General: General OT Amount of Missed Time: 19 Minutes Pain: Pain Assessment Pain Scale: 0-10 Pain Score: 0-No pain  Therapy/Group: Individual Therapy  Leroy Libman 04/01/2022, 11:42 AM

## 2022-04-01 NOTE — Discharge Summary (Signed)
Physician Discharge Summary  Patient ID: Alexander Simmons MRN: [id number] DOB/AGE: [birth year] 66 y.o.  Admit date: 03/28/2022 Discharge date: 04/02/2022  Discharge Diagnoses:  Principal Problem:   Acute cardioembolic stroke [facility]) Active Problems:   Hypertension, essential   Vitamin D deficiency   AKI (acute kidney injury) ([place])   Vitamin B 12 deficiency   Hyperkalemia   Discharged Condition: good  Significant Diagnostic Studies: N/A   Labs:  Basic Metabolic Panel: Recent Labs  Lab 03/29/22 0644 04/01/22 0553 04/02/22 0702  NA 133* 135 139  K 4.6 5.0 5.2*  CL 100 101 102  CO2 '22 25 24  '$ GLUCOSE 123* 113* 115*  BUN 18 24* 22  CREATININE 1.09 1.44* 1.27*  CALCIUM 9.6 9.6 9.6    CBC: Recent Labs  Lab 03/29/22 0644 04/01/22 0553  WBC 6.5 5.3  NEUTROABS 4.2  --   HGB 13.6 13.8  HCT 39.2 41.0  MCV 98.2 101.5*  PLT 258 300    CBG: No results for input(s): "GLUCAP" in the last 168 hours.   Brief HPI:   Alexander Simmons is a 66 y.o. male with history of HTN, glaucoma, tobacco and alcohol abuse, learning disability who was admitted to [facility] on 03/24/2022 after multiple falls with CTA/perfusion head showing occlusion of right M2 branch with decreased area of perfusion posterior right MCA and he received TNK.  He was started on IV thiamine, transferred to [facility] 07/13 and underwent cerebral angio which was negative for filling defects or occlusion.  MRI/MRA brain done revealing early infarct in the deep right insula, corona radiata as well as poor visualization of distal MCA branches with slow flow but no occlusion.  Repeat MRI brain 08/01 showed new punctate infarcts in right frontal, left posterior frontal and right occipital lobe.  He was found to have A-fib on 08/02 and started on Eliquis for CVA prophylaxis.  He was noted to have limitations in mobility as well as ability to carry out ADLs.  CIR was recommended due to functional decline.   Hospital Course: Alexander Simmons was admitted to rehab 03/28/2022 for inpatient therapies to consist of PT, ST and OT at least three hours five days a week. Past admission physiatrist, therapy team and rehab RN have worked together to provide customized collaborative inpatient rehab.  His blood pressures were monitored on TID basis and has been controlled without meds.  He was noted to have vitamin B12 deficiency with levels at 170 therefore started on B12 supplements.  He was also noted to have hypocalcemia with ionized calcium at 1.08 and Os-Cal twice daily was added for supplementation.    Follow-up labs showed AKI to have resolved however he had recurrent rise in BUN/creatinine requiring extension of his length of stay by 1 day.  He was hydrated with IV fluids with follow-up labs showing improvement in renal status however K was noted to be high normal.  He was treated with a dose of Lokelma and is to follow-up with PCP for repeat labs in 1 to 2 weeks to monitor potassium levels.  He was advised to increase water intake after discharge.  Follow-up CBC shows H&H to be stable.  He made good gains during his rehab stay and was modified independent at discharge.    Rehab course: During patient's stay in rehab brief team conferences was held to monitor patient's progress, set goals and discuss barriers to discharge. At admission, patient required supervision with basic ADL tasks and with mobility.  He exhibited severe cognitive communication deficits with mild dysarthria with SLUMS score of 15/30.  He has had improvement in activity tolerance, balance, postural control as well as ability to compensate for deficits. He is able to complete ADL tasks at modified independent level.  He is modified independent for transfers and is able to ambulate 150 feet without assistive device.  He is able to climb 12 stairs at modified independent level.   Discharge disposition: 01-Home or Self Care  Diet: Low fat, low cholesterol   Special  Instructions: Increase fluid intake, Recommend repeat BMET in one week to monitor renal status and potassium level.  Discharge Instructions     Ambulatory referral to Neurology   Complete by: As directed    An appointment is requested in approximately: 4-5 weeks   Ambulatory referral to Physical Medicine Rehab   Complete by: As directed    Stroke follow up      Allergies as of 04/02/2022   No Known Allergies      Medication List     STOP taking these medications    amLODipine 5 MG tablet Commonly known as: NORVASC   ibuprofen 800 MG tablet Commonly known as: ADVIL   metoprolol tartrate 25 MG tablet Commonly known as: LOPRESSOR   PILOCARPINE-EPINEPHRINE OP       TAKE these medications    calcium citrate 950 (200 Ca) MG tablet Commonly known as: CALCITRATE - dosed in mg elemental calcium Take 1 tablet (200 mg of elemental calcium total) by mouth 2 (two) times daily. Notes to patient: FOR LOW CALCIUM LEVEL.    CertaVite/Antioxidants Tabs Take 1 tablet by mouth daily.   cyanocobalamin 500 MCG tablet Commonly known as: VITAMIN B12 Take 1 tablet (500 mcg total) by mouth daily. Notes to patient: FOR YOUR LOW VITAMIN B12 LEVELS   Eliquis 5 MG Tabs tablet Generic drug: apixaban Take 1 tablet (5 mg total) by mouth 2 (two) times daily. Notes to patient: BLOOD THINNER TO PREVENT STROKE   folic acid 1 MG tablet Commonly known as: FOLVITE Take 1 tablet (1 mg total) by mouth daily.   pantoprazole 40 MG tablet Commonly known as: PROTONIX Take 1 tablet (40 mg total) by mouth daily. Notes to patient: FOR REFLUX INDIGESTION   thiamine 100 MG tablet Commonly known as: VITAMIN B1 Take 1 tablet (100 mg total) by mouth daily.   Vitamin D (Ergocalciferol) 1.25 MG (50000 UNIT) Caps capsule Commonly known as: DRISDOL Take 1 capsule (50,000 Units total) by mouth every 7 (seven) days. Notes to patient: WEEKLY ON THURSDAY        Follow-up Information     Fayrene Helper, MD Follow up on 04/12/2022.   Specialty: Family Medicine Why: Be there at 4 pm for post hospital follow up and for lab work Contact information: [address] [place] [address] [phone]         Charlett Blake, MD Follow up.   Specialty: Physical Medicine and Rehabilitation Why: office will call you with follow up appointment Contact information: [place] [address] [phone]         [facility] Follow up.   Why: office will call you with follow up appointment Contact information: [address]     [place] [phone]                Signed: Bary Leriche 04/04/2022, 5:00 PM

## 2022-04-01 NOTE — Progress Notes (Signed)
Inpatient Rehabilitation Care Coordinator Discharge Note   Patient Details  Name: Alexander Simmons MRN: [id number] Date of Birth: 07/25/1956   Discharge location: Home  Length of Stay: 4 Days  Discharge activity level: MOD I  Home/community participation: Alexander Simmons or friend  Patient response OI:TGPQDI Literacy - How often do you need to have someone help you when you read instructions, pamphlets, or other written material from your doctor or pharmacy?: Often  Patient response YM:EBRAXE Isolation - How often do you feel lonely or isolated from those around you?: Never  Services provided included: MD, RD, PT, OT, SLP, RN, CM, TR, Pharmacy, SW  Financial Services:  Charity fundraiser Utilized: [name] offered to/list presented to: none  Follow-up services arranged:              Patient response to transportation need: Is the patient able to respond to transportation needs?: Yes In the past 12 months, has lack of transportation kept you from medical appointments or from getting medications?: No In the past 12 months, has lack of transportation kept you from meetings, work, or from getting things needed for daily living?: No    Comments (or additional information):\  Patient/Family verbalized understanding of follow-up arrangements:  Yes  Individual responsible for coordination of the follow-up plan: self or Alexander Simmons [phone]  Confirmed correct DME delivered: Alexander Simmons 04/01/2022    Alexander Simmons

## 2022-04-01 NOTE — Discharge Summary (Signed)
Physical Therapy Discharge Summary  Patient Details  Name: Alexander Simmons MRN: [id number] Date of Birth: 08/25/1956  Today's Date: 04/01/2022 PT Individual Time: 0800-0910 PT Individual Time Calculation (min): 70 min    Patient has met 8 of 8 long term goals due to improved activity tolerance, improved balance, improved postural control, increased strength, improved attention, and improved awareness.  Patient to discharge at an ambulatory level Independent.    Reasons goals not met: n/a  Recommendation:  No follow up PT recommended  Equipment: No equipment provided  Reasons for discharge: treatment goals met and discharge from hospital  Patient/family agrees with progress made and goals achieved: Yes  PT Discharge Precautions/Restrictions Precautions Precautions: None Restrictions Weight Bearing Restrictions: No Pain Interference Pain Interference Pain Effect on Sleep: 0. Does not apply - I have not had any pain or hurting in the past 5 days Pain Interference with Therapy Activities: 0. Does not apply - I have not received rehabilitationtherapy in the past 5 days Pain Interference with Day-to-Day Activities: 1. Rarely or not at all Vision/Perception  Vision - History Ability to See in Adequate Light: 0 Adequate Perception Perception: Within Functional Limits Praxis Praxis: Intact  Cognition Overall Cognitive Status: Within Functional Limits for tasks assessed Arousal/Alertness: Awake/alert Orientation Level: Oriented X4 Sustained Attention: Appears intact Memory: Appears intact Awareness: Appears intact Problem Solving: Appears intact Safety/Judgment: Appears intact Sensation Sensation Light Touch: Appears Intact Hot/Cold: Appears Intact Proprioception: Appears Intact Stereognosis: Appears Intact Coordination Gross Motor Movements are Fluid and Coordinated: Yes Motor  Motor Motor: Within Functional Limits  Mobility Bed Mobility Bed Mobility: Rolling  Right;Rolling Left;Supine to Sit;Sit to Supine Rolling Right: Independent Rolling Left: Independent Supine to Sit: Independent Sit to Supine: Independent Transfers Transfers: Sit to Stand;Stand to Sit Sit to Stand: Independent Stand to Sit: Independent Transfer (Assistive device): None Locomotion  Gait Ambulation: Yes Gait Assistance: Independent Gait Distance (Feet): 150 Feet Assistive device: None Gait Gait: Yes Gait Pattern: Within Functional Limits Stairs / Additional Locomotion Stairs: Yes Stairs Assistance: Independent with assistive device Stair Management Technique: Two rails Number of Stairs: 12 Height of Stairs: 6 Wheelchair Mobility Wheelchair Mobility: No  Trunk/Postural Assessment  Cervical Assessment Cervical Assessment: Within Functional Limits Thoracic Assessment Thoracic Assessment: Within Functional Limits Lumbar Assessment Lumbar Assessment: Within Functional Limits Postural Control Postural Control: Within Functional Limits  Balance Balance Balance Assessed: Yes Standardized Balance Assessment Standardized Balance Assessment: Berg Balance Test;Functional Gait Assessment Berg Balance Test Sit to Stand: Able to stand without using hands and stabilize independently Standing Unsupported: Able to stand safely 2 minutes Sitting with Back Unsupported but Feet Supported on Floor or Stool: Able to sit safely and securely 2 minutes Stand to Sit: Sits safely with minimal use of hands Transfers: Able to transfer safely, minor use of hands Standing Unsupported with Eyes Closed: Able to stand 10 seconds safely Standing Ubsupported with Feet Together: Able to place feet together independently and stand 1 minute safely From Standing, Reach Forward with Outstretched Arm: Can reach forward >12 cm safely (5") From Standing Position, Pick up Object from Floor: Able to pick up shoe safely and easily From Standing Position, Turn to Look Behind Over each Shoulder:  Looks behind from both sides and weight shifts well Turn 360 Degrees: Able to turn 360 degrees safely in 4 seconds or less Standing Unsupported, Alternately Place Feet on Step/Stool: Able to stand independently and safely and complete 8 steps in 20 seconds Standing Unsupported, One Foot in Front: Able to place foot  tandem independently and hold 30 seconds Standing on One Leg: Able to lift leg independently and hold 5-10 seconds Total Score: 54 Functional Gait  Assessment Gait assessed : Yes Gait Level Surface: Walks 20 ft in less than 5.5 sec, no assistive devices, good speed, no evidence for imbalance, normal gait pattern, deviates no more than 6 in outside of the 12 in walkway width. Change in Gait Speed: Able to smoothly change walking speed without loss of balance or gait deviation. Deviate no more than 6 in outside of the 12 in walkway width. Gait with Horizontal Head Turns: Performs head turns smoothly with slight change in gait velocity (eg, minor disruption to smooth gait path), deviates 6-10 in outside 12 in walkway width, or uses an assistive device. Gait with Vertical Head Turns: Performs task with slight change in gait velocity (eg, minor disruption to smooth gait path), deviates 6 - 10 in outside 12 in walkway width or uses assistive device Gait and Pivot Turn: Pivot turns safely within 3 sec and stops quickly with no loss of balance. Step Over Obstacle: Is able to step over one shoe box (4.5 in total height) without changing gait speed. No evidence of imbalance. Gait with Narrow Base of Support: Ambulates 4-7 steps. Gait with Eyes Closed: Walks 20 ft, no assistive devices, good speed, no evidence of imbalance, normal gait pattern, deviates no more than 6 in outside 12 in walkway width. Ambulates 20 ft in less than 7 sec. Ambulating Backwards: Walks 20 ft, no assistive devices, good speed, no evidence for imbalance, normal gait Steps: Alternating feet, no rail. Total Score:  25 Extremity Assessment      RLE Assessment RLE Assessment: Within Functional Limits LLE Assessment LLE Assessment: Within Functional Limits   Skilled Intervention: Pt sitting in recliner to start - agreeable to therapy. Denies pain. Reports feeling confident regarding upcoming discharge home. He has no particular questions or concerns. Reviewed home safety, fall prevention. Pt completing all mobility during session at independent level. Car transfer, unlevel gait on ramp/mulch pit, and up/down x12 6inch steps using 1 hand rail. No AD used.   Completed Berg balance testing to assess falls risk. See above for outlined details.  Patient demonstrates increased fall risk as noted by score of  54/56 on Berg Balance Scale.  (<36= high risk for falls, close to 100%; 37-45 significant >80%; 46-51 moderate >50%; 52-55 lower >25%). This is a 14 point improvement compared to when he completed this day on of evaluation.   Patient demonstrates increased fall risk as noted by score of 25/30 on  Functional Gait Assessment.   <22/30 = predictive of falls, <20/30 = fall in 6 months, <18/30 = predictive of falls in PD MCID: 5 points stroke population, 4 points geriatric population (ANPTA Core Set of Outcome Measures for Adults with Neurologic Conditions, 2018). This is a 3 point improvement compared to when he completed this on day of evaluation.  Ambulated around hospital indoors and outdoors independently, >1034ft distances. Outdoors, worked on community reintegration with crossing streets, going down stairs, navigating unlevel surfaces outdoors, and transferring on/off park benches. No signs of instability or difficulty during these.   While outdoors, discussed return to home, stroke recovery, B.E.F.A.S.T for signs of stroke, risk factors for CVA and lifestyle modifications, etc. Pt voiced understanding of all.  Concluded session with patient in room (made independent) with all needs met.       Quaron Delacruz P [facility] PT 04/01/2022, 7:55 AM

## 2022-04-01 NOTE — Plan of Care (Signed)
Pt to d/c today

## 2022-04-01 NOTE — Progress Notes (Addendum)
Inpatient Rehabilitation Discharge Medication Review by a Pharmacist  A complete drug regimen review was completed for this patient to identify any potential clinically significant medication issues.  High Risk Drug Classes Is patient taking? Indication by Medication  Antipsychotic No   Anticoagulant Yes Apixaban -new onset non-valvular atrial fibrillation and stroke prevention  Antibiotic No   Opioid No   Antiplatelet No   Hypoglycemics/insulin No   Vasoactive Medication No   Chemotherapy No   Other Yes Calcium citrate - supplement for hypocalcemia Cyanocobalamin: Vitamin B12 deficiency/supplement Folic acid, multivitamin, thiamine-supplements due to ETOH abuse. Ergocalciferol (Vit D)- Vitamin D deficiency        Type of Medication Issue Identified Description of Issue Recommendation(s)  Drug Interaction(s) (clinically significant)     Duplicate Therapy     Allergy     No Medication Administration End Date     Incorrect Dose     Additional Drug Therapy Needed     Significant med changes from prior encounter (inform family/care partners about these prior to discharge). ASA-  changed to ASA and plavix during [facility] acute care admit then on 03/27/22 changed to Eliquis alone per cardiology/ Neuro okay with Eliquis.   Metoprolol, amlodipine- stopped  Pilocarpine-epinephrine eye drops- stopped     Other       Clinically significant medication issues were identified that warrant physician communication and completion of prescribed/recommended actions by midnight of the next day:   No  Name of provider notified for urgent issues identified: n/a  Pharmacist comments: n/a  Time spent performing this drug regimen review (minutes):  [place], PharmD, BCPS Clinical Pharmacist Clinical phone for 04/01/2022 until 3p is [phone] 04/01/2022 10:49 AM

## 2022-04-02 ENCOUNTER — Telehealth: Payer: Self-pay | Admitting: Family Medicine

## 2022-04-02 ENCOUNTER — Ambulatory Visit: Payer: 59 | Admitting: Family Medicine

## 2022-04-02 ENCOUNTER — Other Ambulatory Visit (HOSPITAL_COMMUNITY): Payer: Self-pay

## 2022-04-02 DIAGNOSIS — E875 Hyperkalemia: Secondary | ICD-10-CM | POA: Diagnosis not present

## 2022-04-02 DIAGNOSIS — E538 Deficiency of other specified B group vitamins: Secondary | ICD-10-CM

## 2022-04-02 DIAGNOSIS — I10 Essential (primary) hypertension: Secondary | ICD-10-CM | POA: Diagnosis not present

## 2022-04-02 DIAGNOSIS — N179 Acute kidney failure, unspecified: Secondary | ICD-10-CM | POA: Diagnosis not present

## 2022-04-02 DIAGNOSIS — I639 Cerebral infarction, unspecified: Secondary | ICD-10-CM | POA: Diagnosis not present

## 2022-04-02 LAB — BASIC METABOLIC PANEL
Anion gap: 13 (ref 5–15)
BUN: 22 mg/dL (ref 8–23)
CO2: 24 mmol/L (ref 22–32)
Calcium: 9.6 mg/dL (ref 8.9–10.3)
Chloride: 102 mmol/L (ref 98–111)
Creatinine, Ser: 1.27 mg/dL — ABNORMAL HIGH (ref 0.61–1.24)
GFR, Estimated: >60 mL/min (ref >60)
Glucose, Bld: 115 mg/dL — ABNORMAL HIGH (ref 70–99)
Potassium: 5.2 mmol/L — ABNORMAL HIGH (ref 3.5–5.1)
Sodium: 139 mmol/L (ref 135–145)

## 2022-04-02 MED ORDER — SODIUM ZIRCONIUM CYCLOSILICATE 5 G PO PACK
5.0000 g | PACK | Freq: Once | ORAL | Status: AC
  Administered 2022-04-02: 5 g via ORAL
  Filled 2022-04-02: qty 1

## 2022-04-02 MED ORDER — SODIUM ZIRCONIUM CYCLOSILICATE 5 G PO PACK: 5.0000 g | PACK | Freq: Every day | ORAL | Status: DC

## 2022-04-02 NOTE — Telephone Encounter (Signed)
Please see other phone message patient scheduled TOC call will be made day after discharge

## 2022-04-02 NOTE — Progress Notes (Signed)
Pt discharged with TOC pharm meds and personal belongings. Pt ambulated by self to car with no complications.

## 2022-04-02 NOTE — Telephone Encounter (Signed)
Pt called to schedule TOC, he is being discharged today 8.8.23. He is scheduled to 04/12/22. Please do TOC when available.

## 2022-04-02 NOTE — Telephone Encounter (Signed)
Olin Hauser with [facility] called in on patient behalf. Looking to get patient scheduled for TOC .  Call back info for Olin Hauser [phone]

## 2022-04-02 NOTE — Progress Notes (Signed)
PROGRESS NOTE   Subjective/Complaints:  Scr a little improved after IVF. No new concerns.   ROS-  Pt denies SOB, abd pain, cough, CP,  Palpitations, N/V/C/D, and vision changes   Objective:   No results found. Recent Labs    04/01/22 0553  WBC 5.3  HGB 13.8  HCT 41.0  PLT 300    Recent Labs    04/01/22 0553  NA 135  K 5.0  CL 101  CO2 25  GLUCOSE 113*  BUN 24*  CREATININE 1.44*  CALCIUM 9.6     Intake/Output Summary (Last 24 hours) at 04/02/2022 0752 Last data filed at 04/02/2022 0734 Gross per 24 hour  Intake 921.3 ml  Output 1700 ml  Net -778.7 ml         Physical Exam: Vital Signs Blood pressure 99/76, pulse 91, temperature 98.8 F (37.1 C), temperature source Oral, resp. rate 14, height '5\' 1"'$  (1.549 m), weight 65 kg, SpO2 100 %.    General: awake, alert, appropriate, NAD HENT: conjugate gaze; oropharynx moist- small hematoma at tip of tongue CV: regular rate; no JVD Pulmonary: CTA B/L; no W/R/R- good air movement GI: soft, NT, ND, (+)BS Psychiatric: appropriate, pleasant Neurological: alert Skin: No evidence of breakdown, no evidence of rash Neurologic: Cranial nerves II through XII intact, motor strength is 5/5 in bilateral deltoid, bicep, tricep, grip, hip flexor, knee extensors, ankle dorsiflexor and plantar flexor Sensory exam normal sensation to light touch and proprioception in bilateral upper and lower extremities Cerebellar exam normal finger to nose to finger as well as heel to shin in bilateral upper and lower extremities Musculoskeletal: Full range of motion in all 4 extremities. No joint swelling   Assessment/Plan: 1. Functional deficits which require 3+ hours per day of interdisciplinary therapy in a comprehensive inpatient rehab setting. Physiatrist is providing close team supervision and 24 hour management of active medical problems listed below. Physiatrist and rehab team  continue to assess barriers to discharge/monitor patient progress toward functional and medical goals  Care Tool:  Bathing    Body parts bathed by patient: Right arm, Left arm, Chest, Abdomen, Front perineal area, Buttocks, Right upper leg, Left upper leg, Right lower leg, Face, Left lower leg         Bathing assist Assist Level: Independent     Upper Body Dressing/Undressing Upper body dressing   What is the patient wearing?: Pull over shirt    Upper body assist Assist Level: Independent    Lower Body Dressing/Undressing Lower body dressing      What is the patient wearing?: Pants, Underwear/pull up     Lower body assist Assist for lower body dressing: Independent     Toileting Toileting    Toileting assist Assist for toileting: Independent     Transfers Chair/bed transfer  Transfers assist     Chair/bed transfer assist level: Independent     Locomotion Ambulation   Ambulation assist      Assist level: Independent Assistive device: No Device Max distance: 542f   Walk 10 feet activity   Assist     Assist level: Independent Assistive device: No Device   Walk 50 feet activity  Assist    Assist level: Independent Assistive device: No Device    Walk 150 feet activity   Assist    Assist level: Independent Assistive device: No Device    Walk 10 feet on uneven surface  activity   Assist     Assist level: Independent     Wheelchair     Assist Is the patient using a wheelchair?: No             Wheelchair 50 feet with 2 turns activity    Assist            Wheelchair 150 feet activity     Assist          Blood pressure 99/76, pulse 91, temperature 98.8 F (37.1 C), temperature source Oral, resp. rate 14, height '5\' 1"'$  (1.549 m), weight 65 kg, SpO2 100 %.  Medical Problem List and Plan: 1. Functional deficits secondary to embolic right MCA infarcts involving insula and corona radiata              -patient may shower             -ELOS/Goals: 7-10 days, supervision to mod I goals with PT, OT, SLP  Continue CIR- PT, OT and SLP  -DC home today 2.  Antithrombotics: -DVT/anticoagulation:  Pharmaceutical: Eliquis             -antiplatelet therapy: N/a 3. Pain Management: Tylenol prn.  4. Mood/Behavior/Sleep: LCSW to follow for evaluation and support.              -antipsychotic agents: N/A 5. Neuropsych/cognition: This patient is capable of making decisions on his own behalf. 6. Skin/Wound Care: Routine pressure relief measures.             --encourage appropriate nutrition 7. Fluids/Electrolytes/Nutrition:  Monitor I/O. 8. HTN: Monitor BP TID. Controlled without meds at this time.   -8/8 well controlled 9. AKI: Encourage fluid intake. Recheck renal status in am.   8/6- has improved- con't regimen  -8/7 IVF started  -Improved today Scr at 1.27, encourage oral intake    Latest Ref Rng & Units 04/02/2022    7:02 AM 04/01/2022    5:53 AM 03/29/2022    6:44 AM  BMP  Glucose 70 - 99 mg/dL 115  113  123   BUN 8 - 23 mg/dL '22  24  18   '$ Creatinine 0.61 - 1.24 mg/dL 1.27  1.44  1.09   Sodium 135 - 145 mmol/L 139  135  133   Potassium 3.5 - 5.1 mmol/L 5.2  5.0  4.6   Chloride 98 - 111 mmol/L 102  101  100   CO2 22 - 32 mmol/L '24  25  22   '$ Calcium 8.9 - 10.3 mg/dL 9.6  9.6  9.6     10 Hypocalcemia: Ionized calcium-1.08. Add calcium supplement 11. Leucocytosis: Monitor for signs of infection. Recheck CBC in am.  8/6- has resolved- con't to monitor  8/7 WBC down to 5.3    Latest Ref Rng & Units 04/01/2022    5:53 AM 03/29/2022    6:44 AM 03/26/2022    6:28 AM  CBC  WBC 4.0 - 10.5 K/uL 5.3  6.5  13.1   Hemoglobin 13.0 - 17.0 g/dL 13.8  13.6  11.3   Hematocrit 39.0 - 52.0 % 41.0  39.2  33.2   Platelets 150 - 400 K/uL 300  258  244     12. Vitamin B12 deficiency: Vit  B12-170.  --Will add B12 supplement.  13. Chronic Vitamin D deficiency: Vit D-26.7. Add ergocalciferol for supplement.  14.  Elevated Triglycerids-647:  Question statin v/s alternate 15. ETOH abuse: Reports intermittent use-->monitor for signs of withdrawal, provide counseling             --continue to educate on importance of cessation.  `           --continue folic acid and Vitamin B1.  16.  New onset A Fib now on Eliquis   8/6- has some bleeding from tip of tongue- small amounts- likely due to eliquis- monitor for bleeding 17. Hematoma on Tongue  As per #16 18. Mild Hyperkalemia, asymptomatic  -K+ 5.2 today, will give lokelma today, recheck as outpatient  LOS: 5 days A FACE TO FACE EVALUATION WAS PERFORMED  Jennye Boroughs 04/02/2022, 7:52 AM

## 2022-04-03 ENCOUNTER — Telehealth: Payer: Self-pay | Admitting: *Deleted

## 2022-04-03 NOTE — Telephone Encounter (Signed)
Transition Care Management Follow-up Telephone Call Date of discharge and from where: 04-02-22  How have you been since you were released from the hospital? Doing ok  Any questions or concerns? No  Items Reviewed: Did the pt receive and understand the discharge instructions provided? Yes  Medications obtained and verified? Yes  Other? No  Any new allergies since your discharge? No  Dietary orders reviewed? Yes Do you have support at home? Yes   Home Care and Equipment/Supplies: Were home health services ordered? not applicable If so, what is the name of the agency? NA  Has the agency set up a time to come to the patient's home? not applicable Were any new equipment or medical supplies ordered?  No What is the name of the medical supply agency? NA Were you able to get the supplies/equipment? not applicable Do you have any questions related to the use of the equipment or supplies? No  Functional Questionnaire: (I = Independent and D = Dependent) ADLs: i  Bathing/Dressing- i  Meal Prep- i  Eating- i  Maintaining continence- i  Transferring/Ambulation- i  Managing Meds- i  Follow up appointments reviewed:  PCP Hospital f/u appt confirmed? Yes  Scheduled to see Simpson on 04-12-22 @ 4:00. [facility] f/u appt confirmed? Yes   Are transportation arrangements needed? No  If their condition worsens, is the pt aware to call PCP or go to the Emergency Dept.? Yes Was the patient provided with contact information for the PCP's office or ED? Yes Was to pt encouraged to call back with questions or concerns? Yes

## 2022-04-04 ENCOUNTER — Telehealth: Payer: Self-pay

## 2022-04-04 ENCOUNTER — Telehealth: Payer: Self-pay | Admitting: Family Medicine

## 2022-04-04 DIAGNOSIS — E875 Hyperkalemia: Secondary | ICD-10-CM

## 2022-04-04 LAB — VITAMIN B1: Vitamin B1 (Thiamine): 153.4 nmol/L (ref 66.5–200.0)

## 2022-04-04 NOTE — Telephone Encounter (Signed)
Transitional Care call--who you talked with Spoke w/ Alexander Simmons Patients cousin at phone #: [phone]    Are you/is patient experiencing any problems since coming home? Are there any questions regarding any aspect of care? No problems, no questions. Are there any questions regarding medications administration/dosing? Are meds being taken as prescribed? Patient should review meds with caller to confirm ( Patient was advised to stop taking BP medicine and was not given a replacement and also was asked to start taking B-12 but unsure of the dosage and if it should be prescribed or taken over the counter. Have there been any falls? No falls. Has Home Health been to the house and/or have they contacted you? If not, have you tried to contact them? Can we help you contact them? Home health has not been contacted, has not tried to contact home health. Are bowels and bladder emptying properly? Are there any unexpected incontinence issues? If applicable, is patient following bowel/bladder programs? No issues with bowels and bladder. Any fevers, problems with breathing, unexpected pain? No fevers or problems with breathing, but patient is experiencing some pain in left arms and hand. Are there any skin problems or new areas of breakdown? No skin problems or breakdown Has the patient/family member arranged specialty MD follow up (ie cardiology/neurology/renal/surgical/etc)?  Can we help arrange? Yes working with PCP for other follow up appointments. Does the patient need any other services or support that we can help arrange? transportation Are caregivers following through as expected in assisting the patient? yes Has the patient quit smoking, drinking alcohol, or using drugs as recommended? yes  Appointment time, arrive time and who it is with here [phone]

## 2022-04-04 NOTE — Telephone Encounter (Signed)
Toc done and visit scheduled.

## 2022-04-04 NOTE — Telephone Encounter (Signed)
Mims called in on patient behalf in regard to BP meds,  States that patient was told to stop taking bp meds in hospital . Should have been sent home with script but was not.  Wants a call back in regard.

## 2022-04-05 ENCOUNTER — Telehealth: Payer: Self-pay | Admitting: Family Medicine

## 2022-04-05 NOTE — Telephone Encounter (Signed)
Nees toc call has appt next week, d/c today, thanks

## 2022-04-05 NOTE — Telephone Encounter (Signed)
Patient aware of appt change and to bring medications.

## 2022-04-09 ENCOUNTER — Encounter: Payer: Self-pay | Admitting: Family Medicine

## 2022-04-09 ENCOUNTER — Other Ambulatory Visit: Payer: Self-pay | Admitting: Family Medicine

## 2022-04-09 ENCOUNTER — Ambulatory Visit (INDEPENDENT_AMBULATORY_CARE_PROVIDER_SITE_OTHER): Payer: 59 | Admitting: Family Medicine

## 2022-04-09 VITALS — BP 133/79 | HR 98 | Resp 16 | Ht 61.0 in | Wt 144.0 lb

## 2022-04-09 DIAGNOSIS — I10 Essential (primary) hypertension: Secondary | ICD-10-CM

## 2022-04-09 DIAGNOSIS — Z7689 Persons encountering health services in other specified circumstances: Secondary | ICD-10-CM | POA: Diagnosis not present

## 2022-04-09 DIAGNOSIS — I4891 Unspecified atrial fibrillation: Secondary | ICD-10-CM | POA: Diagnosis not present

## 2022-04-09 DIAGNOSIS — N179 Acute kidney failure, unspecified: Secondary | ICD-10-CM

## 2022-04-09 DIAGNOSIS — F10288 Alcohol dependence with other alcohol-induced disorder: Secondary | ICD-10-CM

## 2022-04-09 DIAGNOSIS — I639 Cerebral infarction, unspecified: Secondary | ICD-10-CM

## 2022-04-09 MED ORDER — THIAMINE HCL 100 MG PO TABS: 100.0000 mg | ORAL_TABLET | Freq: Every day | ORAL | 6 refills | Status: DC

## 2022-04-09 MED ORDER — BLOOD PRESSURE MONITOR DEVI: Status: DC

## 2022-04-09 MED ORDER — FOLIC ACID 1 MG PO TABS: 1.0000 mg | ORAL_TABLET | Freq: Every day | ORAL | 6 refills | Status: DC

## 2022-04-09 MED ORDER — ADULT MULTIVITAMIN W/MINERALS CH: 1.0000 | ORAL_TABLET | Freq: Every day | ORAL | 6 refills | Status: DC

## 2022-04-09 MED ORDER — CALCIUM CITRATE 950 (200 CA) MG PO TABS: 200.0000 mg | ORAL_TABLET | Freq: Two times a day (BID) | ORAL | 5 refills | Status: DC

## 2022-04-09 MED ORDER — CYANOCOBALAMIN 500 MCG PO TABS: 500.0000 ug | ORAL_TABLET | Freq: Every day | ORAL | 5 refills | Status: DC

## 2022-04-09 MED ORDER — CALCIUM CITRATE 950 (200 CA) MG PO TABS: 200.0000 mg | ORAL_TABLET | Freq: Two times a day (BID) | ORAL | 3 refills | Status: DC

## 2022-04-09 MED ORDER — ADULT MULTIVITAMIN W/MINERALS CH: 1.0000 | ORAL_TABLET | Freq: Every day | ORAL | 5 refills | Status: DC

## 2022-04-09 NOTE — Patient Instructions (Addendum)
Annual exam in mid to end October, call if you need me sooner, flu vaccine at visit  Chem 7 and EGFR today  You need to stay off of alcohol  Need to take B12 and calcium daily as prescribed, please collect today  Nurse please refill thiamine , folic acid and multivitamin x 6 , and arrange for a bp cuff for him  Careful, no falls  Thanks for choosing [facility], we consider it a privelige to serve you.

## 2022-04-10 ENCOUNTER — Encounter: Payer: Self-pay | Admitting: Family Medicine

## 2022-04-10 ENCOUNTER — Other Ambulatory Visit: Payer: Self-pay

## 2022-04-10 DIAGNOSIS — Z7689 Persons encountering health services in other specified circumstances: Secondary | ICD-10-CM | POA: Insufficient documentation

## 2022-04-10 LAB — BMP8+EGFR
BUN/Creatinine Ratio: 14 (ref 10–24)
BUN: 16 mg/dL (ref 8–27)
CO2: 22 mmol/L (ref 20–29)
Calcium: 9.7 mg/dL (ref 8.6–10.2)
Chloride: 109 mmol/L — ABNORMAL HIGH (ref 96–106)
Creatinine, Ser: 1.12 mg/dL (ref 0.76–1.27)
Glucose: 92 mg/dL (ref 70–99)
Potassium: 5.7 mmol/L — ABNORMAL HIGH (ref 3.5–5.2)
Sodium: 146 mmol/L — ABNORMAL HIGH (ref 134–144)
eGFR: 72 mL/min/{1.73_m2} (ref >59)

## 2022-04-10 MED ORDER — CALCIUM CITRATE 950 (200 CA) MG PO TABS: 200.0000 mg | ORAL_TABLET | Freq: Two times a day (BID) | ORAL | 3 refills | Status: DC

## 2022-04-10 MED ORDER — THIAMINE HCL 100 MG PO TABS: 100.0000 mg | ORAL_TABLET | Freq: Every day | ORAL | 6 refills | Status: DC

## 2022-04-10 MED ORDER — CYANOCOBALAMIN 500 MCG PO TABS: 500.0000 ug | ORAL_TABLET | Freq: Every day | ORAL | 5 refills | Status: DC

## 2022-04-10 MED ORDER — FOLIC ACID 1 MG PO TABS: 1.0000 mg | ORAL_TABLET | Freq: Every day | ORAL | 6 refills | Status: DC

## 2022-04-10 NOTE — Assessment & Plan Note (Signed)
Minimal residual left sided weakness, ambulates safely and is able to carry out ADL's.

## 2022-04-10 NOTE — Assessment & Plan Note (Signed)
Patient in for follow up of recent hospitalization. Discharge summary, and laboratory and radiology data are reviewed, and any questions or concerns  are discussed. Specific issues requiring follow up are specifically addressed.  

## 2022-04-10 NOTE — Progress Notes (Signed)
   Alexander Simmons     MRN: [id number]      DOB: October 24, 1955   HPI Alexander Simmons is here for follow up hospitalization from 08/03 to 04/02/2022 when he presented with Alexander Simmons acute CVA, cardioembolic. At discharge he is able to carry out all ADL's and has some residual LUE and LLE weakness. No difficulty with speech or swallowing, ambulates safely Hospital course is reviewed, all questions answered to pt satisfaction and follow up studies are done ROS Denies recent fever or chills. Denies sinus pressure, nasal congestion, ear pain or sore throat. Denies chest congestion, productive cough or wheezing. Denies chest pains, palpitations and leg swelling Denies abdominal pain, nausea, vomiting,diarrhea or constipation.   Denies dysuria, frequency, hesitancy or incontinence. Denies joint pain, swelling and limitation in mobility.  Denies depression, anxiety or insomnia. Denies skin break down or rash.   PE  BP 133/79   Pulse 98   Resp 16   Ht '5\' 1"'$  (1.549 m)   Wt 144 lb (65.3 kg)   SpO2 97%   BMI 27.21 kg/m   Patient alert and oriented and in no cardiopulmonary distress.  HEENT: No facial asymmetry, EOMI,     Neck supple .mild right facial weakness  Chest: Clear to auscultation bilaterally.  CVS: S1, S2 no murmurs, no S3.Regular rate.  ABD: Soft non tender.   Ext: No edema  MS: Adequate ROM spine, shoulders, hips and knees.  Skin: Intact, no ulcerations or rash noted.  Psych: Good eye contact, not anxious or depressed appearing.  CNS: CN 2-12 intact, pgrade 4 power in lUE and LLE, grade 5 power in RLE and RUE, sensation intact Assessment & Plan  Acute cardioembolic stroke ([place]) Minimal residual left sided weakness, ambulates safely and is able to carry out ADL's.   Encounter for support and coordination of transition of care Patient in for follow up of recent hospitalization. Discharge summary, and laboratory and radiology data are reviewed, and any questions or concerns  are  discussed. Specific issues requiring follow up are specifically addressed.   Atrial fibrillation with controlled ventricular response (HCC) needs to remain on anticoagulant and this is again explained to him to reduce risk of recurrent stroke  Alcohol dependence ([place]) Importance of abstinanace is stressed  AKI (acute kidney injury) ([place]) rept renal panel  Hypertension, essential Controlled, no change in medication DASH diet and commitment to daily physical activity for a minimum of 30 minutes discussed and encouraged, as a part of hypertension management. The importance of attaining a healthy weight is also discussed.     04/09/2022    2:39 PM 04/02/2022    4:39 AM 04/01/2022    7:38 PM 04/01/2022    1:20 PM 04/01/2022    3:37 AM 03/31/2022    7:21 PM 03/31/2022    1:13 PM  BP/Weight  Systolic BP 563 99 875 643 329 518 841  Diastolic BP 79 76 60 93 86 77 75  Wt. (Lbs) 144        BMI 27.21 kg/m2

## 2022-04-10 NOTE — Assessment & Plan Note (Signed)
Controlled, no change in medication DASH diet and commitment to daily physical activity for a minimum of 30 minutes discussed and encouraged, as a part of hypertension management. The importance of attaining a healthy weight is also discussed.     04/09/2022    2:39 PM 04/02/2022    4:39 AM 04/01/2022    7:38 PM 04/01/2022    1:20 PM 04/01/2022    3:37 AM 03/31/2022    7:21 PM 03/31/2022    1:13 PM  BP/Weight  Systolic BP 244 99 628 638 177 116 579  Diastolic BP 79 76 60 93 86 77 75  Wt. (Lbs) 144        BMI 27.21 kg/m2

## 2022-04-10 NOTE — Assessment & Plan Note (Signed)
rept renal panel

## 2022-04-10 NOTE — Assessment & Plan Note (Signed)
needs to remain on anticoagulant and this is again explained to him to reduce risk of recurrent stroke

## 2022-04-10 NOTE — Assessment & Plan Note (Signed)
Importance of abstinanace is stressed

## 2022-04-11 ENCOUNTER — Other Ambulatory Visit (HOSPITAL_BASED_OUTPATIENT_CLINIC_OR_DEPARTMENT_OTHER): Payer: Self-pay

## 2022-04-12 ENCOUNTER — Ambulatory Visit: Payer: 59 | Admitting: Family Medicine

## 2022-04-15 ENCOUNTER — Encounter: Payer: 59 | Attending: Registered Nurse | Admitting: Registered Nurse

## 2022-05-03 ENCOUNTER — Telehealth: Payer: Self-pay | Admitting: Family Medicine

## 2022-05-03 ENCOUNTER — Other Ambulatory Visit: Payer: Self-pay

## 2022-05-03 MED ORDER — ADULT MULTIVITAMIN W/MINERALS CH: 1.0000 | ORAL_TABLET | Freq: Every day | ORAL | 5 refills | Status: DC

## 2022-05-03 MED ORDER — THIAMINE HCL 100 MG PO TABS: 100.0000 mg | ORAL_TABLET | Freq: Every day | ORAL | 6 refills | Status: DC

## 2022-05-03 MED ORDER — CYANOCOBALAMIN 500 MCG PO TABS: 500.0000 ug | ORAL_TABLET | Freq: Every day | ORAL | 5 refills | Status: DC

## 2022-05-03 MED ORDER — PANTOPRAZOLE SODIUM 40 MG PO TBEC: 40.0000 mg | DELAYED_RELEASE_TABLET | Freq: Every day | ORAL | 5 refills | Status: DC

## 2022-05-03 MED ORDER — CALCIUM CITRATE 950 (200 CA) MG PO TABS: 200.0000 mg | ORAL_TABLET | Freq: Two times a day (BID) | ORAL | 3 refills | Status: DC

## 2022-05-03 MED ORDER — VITAMIN D (ERGOCALCIFEROL) 1.25 MG (50000 UNIT) PO CAPS: 50000.0000 [IU] | ORAL_CAPSULE | ORAL | 5 refills | Status: DC

## 2022-05-03 MED ORDER — FOLIC ACID 1 MG PO TABS: 1.0000 mg | ORAL_TABLET | Freq: Every day | ORAL | 6 refills | Status: DC

## 2022-05-03 NOTE — Telephone Encounter (Signed)
All meds refilled to [facility] [place].

## 2022-05-03 NOTE — Telephone Encounter (Signed)
Pt family called stating that some of the medication that was given to him in the hospital have ran out and they are wanting to know if he needs to continue on these medication or what to do? Please advise  thiamine (VITAMIN B1) 100 MG tablet   Vitamin D, Ergocalciferol, (DRISDOL) 1.25 MG (50000 UNIT) CAPS capsule  pantoprazole (PROTONIX) 40 MG tablet  folic acid (FOLVITE) 1 MG tablet  cyanocobalamin (VITAMIN B12) 500 MCG tablet  calcium citrate (CALCITRATE - DOSED IN MG ELEMENTAL CALCIUM) 950 (200 Ca) MG tablet  Certavite???  [facility]

## 2022-05-03 NOTE — Telephone Encounter (Signed)
Aware they are to give Alexander Simmons all the meds requested

## 2022-05-07 ENCOUNTER — Encounter: Payer: 59 | Admitting: Family Medicine

## 2022-05-30 ENCOUNTER — Inpatient Hospital Stay: Payer: 59 | Admitting: Neurology

## 2022-06-03 ENCOUNTER — Other Ambulatory Visit: Payer: Self-pay

## 2022-06-03 ENCOUNTER — Telehealth: Payer: Self-pay | Admitting: Family Medicine

## 2022-06-03 MED ORDER — APIXABAN 5 MG PO TABS: 5.0000 mg | ORAL_TABLET | Freq: Two times a day (BID) | ORAL | 6 refills | Status: DC

## 2022-06-03 NOTE — Telephone Encounter (Signed)
Patient needs refill on   apixaban (ELIQUIS) 5 MG TABS tablet      [facility]

## 2022-06-03 NOTE — Telephone Encounter (Signed)
Refilled x 6

## 2022-06-14 ENCOUNTER — Encounter: Payer: 59 | Admitting: Family Medicine

## 2022-06-17 ENCOUNTER — Encounter: Payer: Self-pay | Admitting: Internal Medicine

## 2022-06-17 ENCOUNTER — Ambulatory Visit (INDEPENDENT_AMBULATORY_CARE_PROVIDER_SITE_OTHER): Payer: 59 | Admitting: Internal Medicine

## 2022-06-17 VITALS — BP 150/90 | HR 99 | Resp 16 | Ht 61.0 in | Wt 150.4 lb

## 2022-06-17 DIAGNOSIS — Z23 Encounter for immunization: Secondary | ICD-10-CM

## 2022-06-17 DIAGNOSIS — I10 Essential (primary) hypertension: Secondary | ICD-10-CM

## 2022-06-17 DIAGNOSIS — F10288 Alcohol dependence with other alcohol-induced disorder: Secondary | ICD-10-CM | POA: Diagnosis not present

## 2022-06-17 DIAGNOSIS — E875 Hyperkalemia: Secondary | ICD-10-CM

## 2022-06-17 DIAGNOSIS — I63411 Cerebral infarction due to embolism of right middle cerebral artery: Secondary | ICD-10-CM

## 2022-06-17 DIAGNOSIS — F10982 Alcohol use, unspecified with alcohol-induced sleep disorder: Secondary | ICD-10-CM

## 2022-06-17 DIAGNOSIS — G4700 Insomnia, unspecified: Secondary | ICD-10-CM | POA: Insufficient documentation

## 2022-06-17 MED ORDER — AMLODIPINE BESYLATE 5 MG PO TABS: 5.0000 mg | ORAL_TABLET | Freq: Every day | ORAL | 1 refills | Status: DC

## 2022-06-17 NOTE — Assessment & Plan Note (Signed)
  Patient's BP today is 150/90 with a goal of <140/80.   Assessment/Plan: Patient has elevated blood pressure. BP fluctuates on review in chart. Hyperkalemia on recent lab work, so will not start ARB today. Start amlodipine.  - Start amlodipine 5 mg daily . - Follow up in 4 weeks for BP check

## 2022-06-17 NOTE — Patient Instructions (Signed)
Thank you for trusting me with your care. To recap, today we discussed the following:   High Blood pressure BP: (!) 150/90    Blood pressure is elevated , start amlodipine 5 mg daily.    History of Stroke - Continue taking Eliquis

## 2022-06-17 NOTE — Assessment & Plan Note (Signed)
Patient presents to clinic today for physical. He reports he is taking Apixiban daily.   Assessment/Plan: Hx of Cardioembolic stroke 02/2901. Patient has mild weakness in LUE , but otherwise has regained his strength.  - Continue Apixaban

## 2022-06-17 NOTE — Progress Notes (Signed)
     CC: insomnia and physical  HPI:Mr.Alexander Simmons is a 66 y.o. male who presents for evaluation of insomnia, hypertension , and hyperkalemia. For the details of today's visit, please refer to the assessment and plan.   Past Medical History:  Diagnosis Date   Alcohol abuse    Allergy    Cor pulmonale ([place])    By echocardiography   Glaucoma    Humerus head fracture-left 2021   Hyperlipidemia    Hypertension    Small bowel obstruction ([place])    Syncope 12/24/2009   Attributed to use of narcotics plus alcohol   Tobacco abuse     Review of Systems:    Review of Systems  Constitutional:  Negative for chills and fever.  Musculoskeletal:  Negative for falls.  Neurological:  Negative for sensory change and focal weakness.     Physical Exam: Vitals:   06/17/22 1508 06/17/22 1511  BP: (!) 153/90 (!) 150/90  Pulse: 99   Resp: 16   SpO2: 98%   Weight: 150 lb 6.4 oz (68.2 kg)   Height: '5\' 1"'$  (1.549 m)      Physical Exam Constitutional:      General: He is not in acute distress.    Appearance: He is not ill-appearing.  Cardiovascular:     Rate and Rhythm: Normal rate and regular rhythm.     Pulses: Normal pulses.  Pulmonary:     Effort: Pulmonary effort is normal.     Breath sounds: Normal breath sounds. No wheezing.  Neurological:     Mental Status: He is alert and oriented to person, place, and time.     Sensory: No sensory deficit.     Motor: Weakness (4/5 weakness in LUE, 5/5 LLE) present.      Assessment & Plan:   Alcohol dependence ([place]) Patient reports he is drinking a 3 oz of gin on some days, but not every day Stressed importance of abstinence and encouraged progress.   Hyperkalemia Review previous labs and patient hyperkalemic.  Assessment/Plan: Hyperkalemia on most recent labs, recheck today. I did not find any medication on his list to cause hyperkalemia. - Basic Metabolic Panel.  Hypertension, essential  Patient's BP today is 150/90 with a  goal of <140/80.   Assessment/Plan: Patient has elevated blood pressure. BP fluctuates on review in chart. Hyperkalemia on recent lab work, so will not start ARB today. Start amlodipine.  - Start amlodipine 5 mg daily . - Follow up in 4 weeks for BP check   Stroke (cerebrum) [place]) Patient presents to clinic today for physical. He reports he is taking Apixiban daily.   Assessment/Plan: Hx of Cardioembolic stroke 12/8848. Patient has mild weakness in LUE , but otherwise has regained his strength.  - Continue Apixaban     Lorene Dy, MD

## 2022-06-17 NOTE — Assessment & Plan Note (Signed)
Patient reports he is drinking a 3 oz of gin on some days, but not every day Stressed importance of abstinence and encouraged progress.

## 2022-06-17 NOTE — Assessment & Plan Note (Signed)
Review previous labs and patient hyperkalemic.  Assessment/Plan: Hyperkalemia on most recent labs, recheck today. I did not find any medication on his list to cause hyperkalemia. - Basic Metabolic Panel.

## 2022-06-17 NOTE — Assessment & Plan Note (Signed)
Patient reports difficulty sleeping. He wakes up and can not go back to sleep.   Assessment/Plan: Patient has history of alcohol use disorder. We discussed this could be causing insomnia and recommend no alcohol use. We discussed a health bed time routine. Will try these recommendation and follow up if not improving.

## 2022-06-18 LAB — BASIC METABOLIC PANEL
BUN/Creatinine Ratio: 11 (ref 10–24)
BUN: 13 mg/dL (ref 8–27)
CO2: 19 mmol/L — ABNORMAL LOW (ref 20–29)
Calcium: 9.7 mg/dL (ref 8.6–10.2)
Chloride: 102 mmol/L (ref 96–106)
Creatinine, Ser: 1.22 mg/dL (ref 0.76–1.27)
Glucose: 99 mg/dL (ref 70–99)
Potassium: 4.3 mmol/L (ref 3.5–5.2)
Sodium: 139 mmol/L (ref 134–144)
eGFR: 65 mL/min/{1.73_m2} (ref >59)

## 2022-06-19 NOTE — Progress Notes (Signed)
Called patient and left message for them to return call at the office   

## 2022-07-02 ENCOUNTER — Other Ambulatory Visit: Payer: Self-pay

## 2022-07-02 NOTE — Patient Outreach (Signed)
   Telephone outreach to patient to obtain mRS was successfully completed. MRS= 0  Alexander Simmons [facility] Care Management Assistant [phone]  

## 2022-07-26 ENCOUNTER — Ambulatory Visit (INDEPENDENT_AMBULATORY_CARE_PROVIDER_SITE_OTHER): Payer: 59 | Admitting: Family Medicine

## 2022-07-26 ENCOUNTER — Encounter: Payer: Self-pay | Admitting: Family Medicine

## 2022-07-26 VITALS — BP 126/82 | HR 95 | Ht 63.0 in | Wt 145.0 lb

## 2022-07-26 DIAGNOSIS — E559 Vitamin D deficiency, unspecified: Secondary | ICD-10-CM

## 2022-07-26 DIAGNOSIS — Z720 Tobacco use: Secondary | ICD-10-CM

## 2022-07-26 DIAGNOSIS — E785 Hyperlipidemia, unspecified: Secondary | ICD-10-CM

## 2022-07-26 DIAGNOSIS — I10 Essential (primary) hypertension: Secondary | ICD-10-CM | POA: Diagnosis not present

## 2022-07-26 DIAGNOSIS — Z125 Encounter for screening for malignant neoplasm of prostate: Secondary | ICD-10-CM

## 2022-07-26 NOTE — Patient Instructions (Addendum)
Annual exam in February,call if you need me before, please bring medication to your next visit  Blood pressure is excellent    Fasting CBC, lipid, cmp an deGFR, pSA and vit D last week in January  Work on cutting back on/  cutting out snuff and chewing tobacco  Need covid vaccine and RSV vaccine both are at the pharmacy  Thanks for choosing [facility], we consider it a privelige to serve you.

## 2022-07-29 ENCOUNTER — Encounter: Payer: Self-pay | Admitting: Family Medicine

## 2022-07-29 NOTE — Assessment & Plan Note (Signed)
Counseled to discontinue, no quit date set

## 2022-07-29 NOTE — Assessment & Plan Note (Signed)
Controlled, no change in medication DASH diet and commitment to daily physical activity for a minimum of 30 minutes discussed and encouraged, as a part of hypertension management. The importance of attaining a healthy weight is also discussed.     07/26/2022    1:56 PM 06/17/2022    3:11 PM 06/17/2022    3:08 PM 04/09/2022    2:39 PM 04/02/2022    4:39 AM 04/01/2022    7:38 PM 04/01/2022    1:20 PM  BP/Weight  Systolic BP 090 301 499 692 99 493 241  Diastolic BP 82 90 90 79 76 60 93  Wt. (Lbs) 145  150.4 144     BMI 25.69 kg/m2  28.42 kg/m2 27.21 kg/m2

## 2022-07-29 NOTE — Progress Notes (Signed)
   ZEVEN KOCAK     MRN: [id number]      DOB: 27-Sep-1955   HPI Alexander Simmons is here for follow up and re-evaluation of chronic medical conditions, medication management and review of any available recent lab and radiology data.  Preventive health is updated, specifically  Cancer screening and Immunization.   Questions or concerns regarding consultations or procedures which the PT has had in the interim are  addressed. The PT denies any adverse reactions to current medications since the last visit.  There are no new concerns.  There are no specific complaints   ROS Denies recent fever or chills. Denies sinus pressure, nasal congestion, ear pain or sore throat. Denies chest congestion, productive cough or wheezing. Denies chest pains, palpitations and leg swelling Denies abdominal pain, nausea, vomiting,diarrhea or constipation.   Denies dysuria, frequency, hesitancy or incontinence. Denies joint pain, swelling and limitation in mobility. Denies headaches, seizures, numbness, or tingling. Denies depression, anxiety or insomnia. Denies skin break down or rash.   PE  BP 126/82 (BP Location: Right Arm, Patient Position: Sitting, Cuff Size: Normal)   Pulse 95   Ht '5\' 3"'$  (1.6 m)   Wt 145 lb (65.8 kg)   SpO2 96%   BMI 25.69 kg/m   Patient alert and oriented and in no cardiopulmonary distress.  HEENT: No facial asymmetry, EOMI,     Neck supple .  Chest: Clear to auscultation bilaterally.  CVS: S1, S2 no murmurs, no S3.Regular rate.  ABD: Soft non tender.   Ext: No edema  MS: Adequate ROM spine, shoulders, hips and knees.  Skin: Intact, no ulcerations or rash noted.  Psych: Good eye contact, normal affect. Memory intact not anxious or depressed appearing.  CNS: CN 2-12 intact, power,  normal throughout.no focal deficits noted.   Assessment & Plan  Hypertension, essential Controlled, no change in medication DASH diet and commitment to daily physical activity for a  minimum of 30 minutes discussed and encouraged, as a part of hypertension management. The importance of attaining a healthy weight is also discussed.     07/26/2022    1:56 PM 06/17/2022    3:11 PM 06/17/2022    3:08 PM 04/09/2022    2:39 PM 04/02/2022    4:39 AM 04/01/2022    7:38 PM 04/01/2022    1:20 PM  BP/Weight  Systolic BP 630 160 109 323 99 557 322  Diastolic BP 82 90 90 79 76 60 93  Wt. (Lbs) 145  150.4 144     BMI 25.69 kg/m2  28.42 kg/m2 27.21 kg/m2          Tobacco abuse Counseled to discontinue, no quit date set

## 2022-10-09 ENCOUNTER — Ambulatory Visit (INDEPENDENT_AMBULATORY_CARE_PROVIDER_SITE_OTHER): Payer: 59 | Admitting: Family Medicine

## 2022-10-09 DIAGNOSIS — I10 Essential (primary) hypertension: Secondary | ICD-10-CM

## 2022-10-10 ENCOUNTER — Encounter: Payer: Self-pay | Admitting: Family Medicine

## 2022-10-10 ENCOUNTER — Ambulatory Visit (INDEPENDENT_AMBULATORY_CARE_PROVIDER_SITE_OTHER): Payer: 59 | Admitting: Family Medicine

## 2022-10-10 VITALS — BP 120/82 | HR 96 | Ht 63.0 in | Wt 145.1 lb

## 2022-10-10 DIAGNOSIS — I10 Essential (primary) hypertension: Secondary | ICD-10-CM | POA: Diagnosis not present

## 2022-10-10 DIAGNOSIS — F10982 Alcohol use, unspecified with alcohol-induced sleep disorder: Secondary | ICD-10-CM

## 2022-10-10 DIAGNOSIS — F10288 Alcohol dependence with other alcohol-induced disorder: Secondary | ICD-10-CM

## 2022-10-10 DIAGNOSIS — E785 Hyperlipidemia, unspecified: Secondary | ICD-10-CM | POA: Diagnosis not present

## 2022-10-10 DIAGNOSIS — Z23 Encounter for immunization: Secondary | ICD-10-CM

## 2022-10-10 DIAGNOSIS — I4891 Unspecified atrial fibrillation: Secondary | ICD-10-CM

## 2022-10-10 DIAGNOSIS — E559 Vitamin D deficiency, unspecified: Secondary | ICD-10-CM

## 2022-10-10 MED ORDER — FOLIC ACID 1 MG PO TABS: 1.0000 mg | ORAL_TABLET | Freq: Every day | ORAL | 6 refills | Status: DC

## 2022-10-10 MED ORDER — HYDROXYZINE HCL 10 MG PO TABS: ORAL_TABLET | ORAL | 5 refills | Status: DC

## 2022-10-10 MED ORDER — AMLODIPINE BESYLATE 5 MG PO TABS: 5.0000 mg | ORAL_TABLET | Freq: Every day | ORAL | 1 refills | Status: DC

## 2022-10-10 MED ORDER — PANTOPRAZOLE SODIUM 40 MG PO TBEC: 40.0000 mg | DELAYED_RELEASE_TABLET | Freq: Every day | ORAL | 5 refills | Status: DC

## 2022-10-10 NOTE — Patient Instructions (Signed)
Annual exam in 3 to 4 months, call if you need me sooner  Please get labs already ordered today  Please get your first shingrix vaccine today  Drink most of your water by 5 pm  New to help with sleep is hydroxyzine one at bedtime  Thanks for choosing [facility], we consider it a privelige to serve you.'

## 2022-10-11 ENCOUNTER — Telehealth: Payer: Self-pay | Admitting: Family Medicine

## 2022-10-11 MED ORDER — FENOFIBRATE 145 MG PO TABS: 145.0000 mg | ORAL_TABLET | Freq: Every day | ORAL | 5 refills | Status: DC

## 2022-10-11 NOTE — Telephone Encounter (Signed)
error 

## 2022-10-12 LAB — CMP14+EGFR
ALT: 14 IU/L (ref 0–44)
AST: 28 IU/L (ref 0–40)
Albumin/Globulin Ratio: 1.4 (ref 1.2–2.2)
Albumin: 4 g/dL (ref 3.9–4.9)
Alkaline Phosphatase: 96 IU/L (ref 44–121)
BUN/Creatinine Ratio: 13 (ref 10–24)
BUN: 14 mg/dL (ref 8–27)
Bilirubin Total: 0.7 mg/dL (ref 0.0–1.2)
CO2: 19 mmol/L — ABNORMAL LOW (ref 20–29)
Calcium: 8.8 mg/dL (ref 8.6–10.2)
Chloride: 103 mmol/L (ref 96–106)
Creatinine, Ser: 1.09 mg/dL (ref 0.76–1.27)
Globulin, Total: 2.8 g/dL (ref 1.5–4.5)
Glucose: 74 mg/dL (ref 70–99)
Potassium: 4.1 mmol/L (ref 3.5–5.2)
Sodium: 143 mmol/L (ref 134–144)
Total Protein: 6.8 g/dL (ref 6.0–8.5)
eGFR: 74 mL/min/{1.73_m2} (ref >59)

## 2022-10-12 LAB — CBC WITH DIFFERENTIAL/PLATELET
Basophils Absolute: 0.1 10*3/uL (ref 0.0–0.2)
Basos: 1 %
EOS (ABSOLUTE): 0.1 10*3/uL (ref 0.0–0.4)
Eos: 2 %
Hematocrit: 41 % (ref 37.5–51.0)
Hemoglobin: 13.4 g/dL (ref 13.0–17.7)
Immature Grans (Abs): 0 10*3/uL (ref 0.0–0.1)
Immature Granulocytes: 0 %
Lymphocytes Absolute: 1.6 10*3/uL (ref 0.7–3.1)
Lymphs: 41 %
MCH: 33.9 pg — ABNORMAL HIGH (ref 26.6–33.0)
MCHC: 32.7 g/dL (ref 31.5–35.7)
MCV: 104 fL — ABNORMAL HIGH (ref 79–97)
Monocytes Absolute: 0.4 10*3/uL (ref 0.1–0.9)
Monocytes: 10 %
Neutrophils Absolute: 1.8 10*3/uL (ref 1.4–7.0)
Neutrophils: 46 %
Platelets: 254 10*3/uL (ref 150–450)
RBC: 3.95 x10E6/uL — ABNORMAL LOW (ref 4.14–5.80)
RDW: 13.1 % (ref 11.6–15.4)
WBC: 3.9 10*3/uL (ref 3.4–10.8)

## 2022-10-12 LAB — LIPID PANEL
Chol/HDL Ratio: 2.4 ratio (ref 0.0–5.0)
Cholesterol, Total: 170 mg/dL (ref 100–199)
HDL: 72 mg/dL (ref >39)
LDL Chol Calc (NIH): 51 mg/dL (ref 0–99)
Triglycerides: 309 mg/dL — ABNORMAL HIGH (ref 0–149)
VLDL Cholesterol Cal: 47 mg/dL — ABNORMAL HIGH (ref 5–40)

## 2022-10-12 LAB — PSA: Prostate Specific Ag, Serum: 3.3 ng/mL (ref 0.0–4.0)

## 2022-10-12 LAB — VITAMIN D 25 HYDROXY (VIT D DEFICIENCY, FRACTURES): Vit D, 25-Hydroxy: 64.4 ng/mL (ref 30.0–100.0)

## 2022-10-13 ENCOUNTER — Encounter: Payer: Self-pay | Admitting: Family Medicine

## 2022-10-13 NOTE — Assessment & Plan Note (Signed)
Updated lab needed at/ before next visit. Continue supplement

## 2022-10-13 NOTE — Assessment & Plan Note (Signed)
Controlled, no change in medication DASH diet and commitment to daily physical activity for a minimum of 30 minutes discussed and encouraged, as a part of hypertension management. The importance of attaining a healthy weight is also discussed.     10/10/2022    1:58 PM 10/10/2022    1:28 PM 07/26/2022    1:56 PM 06/17/2022    3:11 PM 06/17/2022    3:08 PM 04/09/2022    2:39 PM 04/02/2022    4:39 AM  BP/Weight  Systolic BP 123456 [id number] Q000111Q 0000000 Q000111Q 99  Diastolic BP 82 73 82 90 90 79 76  Wt. (Lbs)  145.08 145  150.4 144   BMI  25.7 kg/m2 25.69 kg/m2  28.42 kg/m2 27.21 kg/m2

## 2022-10-13 NOTE — Progress Notes (Signed)
     Alexander Simmons     MRN: [id number]      DOB: 1956/02/26   HPI [name] is here for follow up and re-evaluation of chronic medical conditions, medication management and review of any available recent lab and radiology data.  Preventive health is updated, specifically  Cancer screening and Immunization.   Questions or concerns regarding consultations or procedures which the PT has had in the interim are  addressed. The PT denies any adverse reactions to current medications since the last visit.  C/o poor sleep, some due to being disturbed by his house mate  ROS Denies recent fever or chills. Denies sinus pressure, nasal congestion, ear pain or sore throat. Denies chest congestion, productive cough or wheezing. Denies chest pains, palpitations and leg swelling Denies abdominal pain, nausea, vomiting,diarrhea or constipation.   Denies dysuria, frequency, hesitancy or incontinence. Denies uncontrolled  joint pain, swelling and limitation in mobility. Denies headaches, seizures, numbness, or tingling. Denies depression, anxiety . Denies skin break down or rash.   PE  BP 120/82   Pulse 96   Ht 5' 3"$  (1.6 m)   Wt 145 lb 1.3 oz (65.8 kg)   SpO2 97%   BMI 25.70 kg/m   Patient alert and oriented and in no cardiopulmonary distress.  HEENT: No facial asymmetry, EOMI,     Neck supple .  Chest: Clear to auscultation bilaterally.Decreased air entry   CVS: S1, S2 no murmurs, no S3.Regular rate.  ABD: Soft non tender.   Ext: No edema  MS: Adequate ROM spine, shoulders, hips and knees.  Skin: Intact, no ulcerations or rash noted.  Psych: Good eye contact, normal affect.  not anxious or depressed appearing.  CNS: CN 2-12 intact, power,  normal throughout.no focal deficits noted.   Assessment & Plan  Hypertension, essential Controlled, no change in medication DASH diet and commitment to daily physical activity for a minimum of 30 minutes discussed and encouraged, as a part  of hypertension management. The importance of attaining a healthy weight is also discussed.     10/10/2022    1:58 PM 10/10/2022    1:28 PM 07/26/2022    1:56 PM 06/17/2022    3:11 PM 06/17/2022    3:08 PM 04/09/2022    2:39 PM 04/02/2022    4:39 AM  BP/Weight  Systolic BP 123456 99991111 [id number] Q000111Q 0000000 Q000111Q 99  Diastolic BP 82 73 82 90 90 79 76  Wt. (Lbs)  145.08 145  150.4 144   BMI  25.7 kg/m2 25.69 kg/m2  28.42 kg/m2 27.21 kg/m2        Vitamin D deficiency Updated lab needed at/ before next visit. Continue supplement  Hyperlipidemia Hyperlipidemia:Low fat diet discussed and encouraged.   Lipid Panel  Lab Results  Component Value Date   CHOL 170 10/10/2022   HDL 72 10/10/2022   LDLCALC 51 10/10/2022   LDLDIRECT 30 03/27/2022   TRIG 309 (H) 10/10/2022   CHOLHDL 2.4 10/10/2022       Alcohol dependence ([place]) Counseled to refrain from all alcohol intake   Insomnia Uncontrolled  Sleep hygiene reviewed and written information offered also. Prescription sent for  medication needed.   Atrial fibrillation with controlled ventricular response (HCC) Continue eliquis indefinitely

## 2022-10-13 NOTE — Assessment & Plan Note (Signed)
Uncontrolled Sleep hygiene reviewed and written information offered also. Prescription sent for  medication needed.  

## 2022-10-13 NOTE — Assessment & Plan Note (Signed)
Hyperlipidemia:Low fat diet discussed and encouraged.   Lipid Panel  Lab Results  Component Value Date   CHOL 170 10/10/2022   HDL 72 10/10/2022   LDLCALC 51 10/10/2022   LDLDIRECT 30 03/27/2022   TRIG 309 (H) 10/10/2022   CHOLHDL 2.4 10/10/2022

## 2022-10-13 NOTE — Assessment & Plan Note (Signed)
Continue eliquis indefinitely.  

## 2022-10-13 NOTE — Assessment & Plan Note (Signed)
Counseled to refrain from all alcohol intake

## 2022-10-20 NOTE — Progress Notes (Signed)
No visit took place on 2/14 by a Provider

## 2022-11-19 ENCOUNTER — Other Ambulatory Visit: Payer: Self-pay | Admitting: Family Medicine

## 2023-01-02 ENCOUNTER — Encounter (INDEPENDENT_AMBULATORY_CARE_PROVIDER_SITE_OTHER): Payer: Self-pay | Admitting: *Deleted

## 2023-01-29 ENCOUNTER — Ambulatory Visit (INDEPENDENT_AMBULATORY_CARE_PROVIDER_SITE_OTHER): Payer: 59

## 2023-01-29 VITALS — Ht 66.0 in | Wt 150.0 lb

## 2023-01-29 DIAGNOSIS — Z0000 Encounter for general adult medical examination without abnormal findings: Secondary | ICD-10-CM

## 2023-01-29 DIAGNOSIS — Z1211 Encounter for screening for malignant neoplasm of colon: Secondary | ICD-10-CM

## 2023-01-29 NOTE — Progress Notes (Signed)
I connected with  Alexander Simmons on 01/29/23 by a audio enabled telemedicine application and verified that I am speaking with the correct person using two identifiers.  Patient Location: Home  Provider Location: Home Office  I discussed the limitations of evaluation and management by telemedicine. The patient expressed understanding and agreed to proceed.  Subjective:   Alexander Simmons is a 67 y.o. male who presents for Medicare Annual/Subsequent preventive examination.  Review of Systems     Cardiac Risk Factors include: advanced age (>35men, >54 women);hypertension;male gender;smoking/ tobacco exposure     Objective:    Today's Vitals   01/29/23 1358 01/29/23 1359  Weight: 150 lb (68 kg)   Height: 5\' 6"  (1.676 m)   PainSc:  8    Body mass index is 24.21 kg/m.     01/29/2023    2:09 PM 03/25/2022    6:22 PM 10/08/2021    9:49 AM 10/03/2020    1:56 PM 09/05/2020    2:23 PM 01/15/2020    9:20 PM 09/23/2018    1:26 PM  Advanced Directives  Does Patient Have a Medical Advance Directive? No No No No No No No  Would patient like information on creating a medical advance directive? No - Patient declined No - Patient declined Yes (ED - Information included in AVS) No - Patient declined No - Patient declined  No - Patient declined    Current Medications (verified) Outpatient Encounter Medications as of 01/29/2023  Medication Sig   amLODipine (NORVASC) 5 MG tablet Take 1 tablet (5 mg total) by mouth daily.   apixaban (ELIQUIS) 5 MG TABS tablet Take 1 tablet (5 mg total) by mouth 2 (two) times daily.   fenofibrate (TRICOR) 145 MG tablet Take 1 tablet (145 mg total) by mouth daily.   folic acid (FOLVITE) 1 MG tablet Take 1 tablet (1 mg total) by mouth daily.   hydrOXYzine (ATARAX) 10 MG tablet Take one tablet by mouth once at bedtime   pantoprazole (PROTONIX) 40 MG tablet Take 1 tablet (40 mg total) by mouth daily.   Vitamin D, Ergocalciferol, (DRISDOL) 1.25 MG (50000 UNIT) CAPS capsule  Take 1 capsule by mouth once a week   No facility-administered encounter medications on file as of 01/29/2023.    Allergies (verified) Patient has no known allergies.   History: Past Medical History:  Diagnosis Date   Alcohol abuse    Allergy    Cor pulmonale (HCC)    By echocardiography   Glaucoma    Humerus head fracture-left 2021   Hyperlipidemia    Hypertension    Small bowel obstruction (HCC)    Syncope 12/24/2009   Attributed to use of narcotics plus alcohol   Tobacco abuse    Past Surgical History:  Procedure Laterality Date   ABDOMINAL SURGERY     COLONOSCOPY N/A 01/28/2013   Procedure: COLONOSCOPY;  Surgeon: Malissa Hippo, MD;  Location: [facility];  Service: Endoscopy;  Laterality: N/A;  1225   IR ANGIO INTRA EXTRACRAN SEL COM CAROTID INNOMINATE BILAT MOD SED  03/25/2022   IR ANGIO VERTEBRAL SEL SUBCLAVIAN INNOMINATE UNI R MOD SED  03/27/2022   PILONIDAL CYST EXCISION  2005   Dr. Elpidio Anis   RADIOLOGY WITH ANESTHESIA N/A 03/25/2022   Procedure: IR WITH ANESTHESIA;  Surgeon: Radiologist, Medication, MD;  Location: [facility] OR;  Service: Radiology;  Laterality: N/A;   TOOTH EXTRACTION     Family History  Problem Relation Age of Onset   Other  Other        FH unknown-patient raised in foster care system   Colon cancer Neg Hx    Social History   Socioeconomic History   Marital status: Single    Spouse name: Not on file   Number of children: 1   Years of education: 3   Highest education level: 12th grade  Occupational History   Occupation: disabled   Tobacco Use   Smoking status: Never   Smokeless tobacco: Current    Types: Snuff, Chew   Tobacco comments:    chews tobacco since age 4.  Vaping Use   Vaping Use: Former  Substance and Sexual Activity   Alcohol use: Yes    Alcohol/week: 20.0 standard drinks of alcohol    Types: 20 Shots of liquor per week    Comment: 1 pint of gin daily (4cups daily)   Drug use: No   Sexual activity: Not Currently  Other  Topics Concern   Not on file  Social History Narrative   Lives with his niece    Social Determinants of Health   Financial Resource Strain: Low Risk  (01/29/2023)   Overall Financial Resource Strain (CARDIA)    Difficulty of Paying Living Expenses: Not hard at all  Food Insecurity: No Food Insecurity (01/29/2023)   Hunger Vital Sign    Worried About Running Out of Food in the Last Year: Never true    Ran Out of Food in the Last Year: Never true  Transportation Needs: No Transportation Needs (01/29/2023)   PRAPARE - Administrator, Civil Service (Medical): No    Lack of Transportation (Non-Medical): No  Physical Activity: Sufficiently Active (01/29/2023)   Exercise Vital Sign    Days of Exercise per Week: 7 days    Minutes of Exercise per Session: 30 min  Stress: No Stress Concern Present (01/29/2023)   [employer] of Occupational Health - Occupational Stress Questionnaire    Feeling of Stress : Not at all  Social Connections: Socially Integrated (01/29/2023)   Social Connection and Isolation Panel [NHANES]    Frequency of Communication with Friends and Family: More than three times a week    Frequency of Social Gatherings with Friends and Family: More than three times a week    Attends Religious Services: More than 4 times per year    Active Member of [employer] or Organizations: Yes    Attends Engineer, structural: More than 4 times per year    Marital Status: Married    Tobacco Counseling Ready to quit: Yes Counseling given: Yes Tobacco comments: chews tobacco since age 45.   Clinical Intake:  Pre-visit preparation completed: Yes  Pain : 0-10 Pain Score: 8  Pain Type: Chronic pain Pain Location: Leg Pain Orientation: Left Pain Onset: More than a month ago Pain Frequency: Constant     BMI - recorded: 24.21 Nutritional Status: BMI of 19-24  Normal Nutritional Risks: None Diabetes: No  How often do you need to have someone help you when you read  instructions, pamphlets, or other written materials from your doctor or pharmacy?: 5 - Always (patient is unable to read or write)  Diabetic?No     Information entered by :: Abby Yamila Cragin, CMA   Activities of Daily Living    01/29/2023    2:07 PM 03/28/2022   10:07 AM  In your present state of health, do you have any difficulty performing the following activities:  Hearing? 0 0  Vision? 0 0  Difficulty concentrating or making decisions? 0 0  Walking or climbing stairs? 0 0  Dressing or bathing? 0 0  Doing errands, shopping? 0 0  Comment someone goes with him.   Preparing Food and eating ? N   Using the Toilet? N   In the past six months, have you accidently leaked urine? N   Do you have problems with loss of bowel control? N   Managing your Medications? N   Managing your Finances? N   Housekeeping or managing your Housekeeping? N     Patient Care Team: Kerri Perches, MD as PCP - General (Family Medicine) Laqueta Linden, MD (Inactive) as PCP - Cardiology (Cardiology) Dietrich Pates Gerrit Friends, MD (Inactive) as Attending Physician (Cardiology)  Indicate any recent Medical Services you may have received from other than [facility] providers in the past year (date may be approximate).     Assessment:   This is a routine wellness examination for [place].  Hearing/Vision screen Hearing Screening - Comments:: Patient denies any hearing difficulties.   Vision Screening - Comments:: Wears rx glasses - up to date with routine eye exams with  [facility]   Dietary issues and exercise activities discussed: Current Exercise Habits: Home exercise routine, Type of exercise: walking, Time (Minutes): 30, Frequency (Times/Week): 7, Weekly Exercise (Minutes/Week): 210, Intensity: Mild, Exercise limited by: cardiac condition(s)   Goals Addressed             This Visit's Progress    Patient Stated       Patient states his goal is to drink more water        Depression  Screen    01/29/2023    2:04 PM 10/10/2022    1:30 PM 07/26/2022    1:58 PM 06/17/2022    3:10 PM 10/10/2021    8:05 AM 10/08/2021   11:21 AM 05/23/2021    8:08 AM  PHQ 2/9 Scores  PHQ - 2 Score 0 0 0 0 0 0 0    Fall Risk    01/29/2023    2:04 PM 10/10/2022    1:30 PM 07/26/2022    1:58 PM 06/17/2022    3:09 PM 04/09/2022    2:40 PM  Fall Risk   Falls in the past year? 0 0 0 0 1  Number falls in past yr: 0 0 0 0 0  Injury with Fall? 0 0 0 0 1  Risk for fall due to : No Fall Risks No Fall Risks No Fall Risks    Follow up Falls prevention discussed Falls evaluation completed Falls evaluation completed      FALL RISK PREVENTION PERTAINING TO THE HOME:  Any stairs in or around the home? Yes  If so, are there any without handrails? No  Home free of loose throw rugs in walkways, pet beds, electrical cords, etc? Yes  Adequate lighting in your home to reduce risk of falls? Yes   ASSISTIVE DEVICES UTILIZED TO PREVENT FALLS:  Life alert? No  Use of a cane, walker or w/c? No  Grab bars in the bathroom? Yes  Shower chair or bench in shower? Yes  Elevated toilet seat or a handicapped toilet? Yes   TIMED UP AND GO:  Was the test performed? No .   Cognitive Function:        01/29/2023    2:09 PM 10/08/2021   11:21 AM 10/03/2020    1:57 PM 10/01/2019   10:40 AM 09/23/2018    1:28  PM  6CIT Screen  What Year? 0 points 0 points 0 points 0 points 0 points  What month? 0 points 0 points 0 points 0 points 0 points  What time? 0 points 0 points 0 points 0 points 0 points  Count back from 20 0 points   0 points 0 points  Months in reverse 0 points 4 points  4 points 4 points  Repeat phrase 0 points 2 points  2 points 0 points  Total Score 0 points   6 points 4 points    Immunizations Immunization History  Administered Date(s) Administered   Fluad Quad(high Dose 65+) 05/11/2020, 05/23/2021, 06/17/2022   Influenza Whole 08/19/2007, 06/05/2010   Influenza,inj,Quad PF,6+ Mos 06/01/2014,  06/27/2016, 09/23/2018, 05/10/2019   PNEUMOCOCCAL CONJUGATE-20 10/10/2021   Pneumococcal Conjugate-13 04/06/2014, 05/11/2020   Td 02/08/2004   Tdap 06/27/2016   Zoster Recombinat (Shingrix) 10/10/2022    TDAP status: Up to date  Flu Vaccine status: Up to date  Pneumococcal vaccine status: Up to date  Covid-19 vaccine status: Information provided on how to obtain vaccines.   Qualifies for Shingles Vaccine? Yes   Zostavax completed Yes   Shingrix Completed?: No.    Education has been provided regarding the importance of this vaccine. Patient has been advised to call insurance company to determine out of pocket expense if they have not yet received this vaccine. Advised may also receive vaccine at local pharmacy or Health Dept. Verbalized acceptance and understanding.  Screening Tests Health Maintenance  Topic Date Due   COVID-19 Vaccine (1) Never done   Zoster Vaccines- Shingrix (2 of 2) 12/05/2022   Colonoscopy  01/29/2023   INFLUENZA VACCINE  03/27/2023   Medicare Annual Wellness (AWV)  01/29/2024   DTaP/Tdap/Td (3 - Td or Tdap) 06/27/2026   Pneumonia Vaccine 58+ Years old  Completed   Hepatitis C Screening  Completed   HPV VACCINES  Aged Out    Health Maintenance  Health Maintenance Due  Topic Date Due   COVID-19 Vaccine (1) Never done   Zoster Vaccines- Shingrix (2 of 2) 12/05/2022   Colonoscopy  01/29/2023    Colorectal cancer screening: Referral to GI placed 01/29/2023. Pt aware the office will call re: appt.  Lung Cancer Screening: (Low Dose CT Chest recommended if Age 70-80 years, 30 pack-year currently smoking OR have quit w/in 15years.) does not qualify.    Additional Screening:  Hepatitis C Screening: does not qualify; Completed 01/15/2016  Vision Screening: Recommended annual ophthalmology exams for early detection of glaucoma and other disorders of the eye. Is the patient up to date with their annual eye exam?  Yes  Who is the provider or what is the name  of the office in which the patient attends annual eye exams? [facility] If pt is not established with a provider, would they like to be referred to a provider to establish care? No .   Dental Screening: Recommended annual dental exams for proper oral hygiene  Community Resource Referral / Chronic Care Management: CRR required this visit?  No   CCM required this visit?  No      Plan:     I have personally reviewed and noted the following in the patient's chart:   Medical and social history Use of alcohol, tobacco or illicit drugs  Current medications and supplements including opioid prescriptions. Patient is not currently taking opioid prescriptions. Functional ability and status Nutritional status Physical activity Advanced directives List of other physicians Hospitalizations, surgeries, and  ER visits in previous 12 months Vitals Screenings to include cognitive, depression, and falls Referrals and appointments  In addition, I have reviewed and discussed with patient certain preventive protocols, quality metrics, and best practice recommendations. A written personalized care plan for preventive services as well as general preventive health recommendations were provided to patient.   Due to this being a telephonic visit, the after visit summary with patients personalized plan was offered to patient via mail or my-chart. Per request, patient was mailed a copy of their AVS.  Any medications not marked as taking were not called out when patient was listing medications. Per patient request a friend provided the names of his medications.   Jordan Hawks Leialoha Hanna, CMA   01/29/2023   Nurse Notes: Referral to GI placed for colonoscopy

## 2023-01-29 NOTE — Patient Instructions (Signed)
Alexander Simmons , Thank you for taking time to come for your Medicare Wellness Visit. I appreciate your ongoing commitment to your health goals. Please review the following plan we discussed and let me know if I can assist you in the future.   These are the goals we discussed:  Goals      Patient Stated     Patient states his goal is to drink more water      Quit using tobacco        This is a list of the screening recommended for you and due dates:  Health Maintenance  Topic Date Due   COVID-19 Vaccine (1) Never done   Zoster (Shingles) Vaccine (2 of 2) 12/05/2022   Colon Cancer Screening  01/29/2023   Flu Shot  03/27/2023   Medicare Annual Wellness Visit  01/29/2024   DTaP/Tdap/Td vaccine (3 - Td or Tdap) 06/27/2026   Pneumonia Vaccine  Completed   Hepatitis C Screening  Completed   HPV Vaccine  Aged Out    Advanced directives: Advance directive discussed with you today. Even though you declined this today, please call our office should you change your mind, and we can give you the proper paperwork for you to fill out. Advance care planning is a way to make decisions about medical care that fits your values in case you are ever unable to make these decisions for yourself.  Information on Advanced Care Planning can be found at [facility] [place] Advance Health Care Directives Advance Health Care Directives ([email or web address]    Conditions/risks identified: Aim for 30 minutes of exercise or brisk walking, 6-8 glasses of water, and 5 servings of fruits and vegetables each day.  A referral has been placed for you to see a Gastroenterologist to update your colonscopy. If you haven't heard from them in a week, please call them to schedule your appt  [facility] [address]  [place] [address] PHONE: [phone]  Next appointment: Follow up in one year for your annual wellness visit. February 18, 2024 at 2pm TELEPHONE VISIT  Preventive Care 68 Years and Older,  Male  Preventive care refers to lifestyle choices and visits with your health care provider that can promote health and wellness. What does preventive care include? A yearly physical exam. This is also called an annual well check. Dental exams once or twice a year. Routine eye exams. Ask your health care provider how often you should have your eyes checked. Personal lifestyle choices, including: Daily care of your teeth and gums. Regular physical activity. Eating a healthy diet. Avoiding tobacco and drug use. Limiting alcohol use. Practicing safe sex. Taking low doses of aspirin every day. Taking vitamin and mineral supplements as recommended by your health care provider. What happens during an annual well check? The services and screenings done by your health care provider during your annual well check will depend on your age, overall health, lifestyle risk factors, and family history of disease. Counseling  Your health care provider may ask you questions about your: Alcohol use. Tobacco use. Drug use. Emotional well-being. Home and relationship well-being. Sexual activity. Eating habits. History of falls. Memory and ability to understand (cognition). Work and work Astronomer. Screening  You may have the following tests or measurements: Height, weight, and BMI. Blood pressure. Lipid and cholesterol levels. These may be checked every 5 years, or more frequently if you are over 47 years old. Skin check. Lung cancer screening. You may have this screening  every year starting at age 56 if you have a 30-pack-year history of smoking and currently smoke or have quit within the past 15 years. Fecal occult blood test (FOBT) of the stool. You may have this test every year starting at age 64. Flexible sigmoidoscopy or colonoscopy. You may have a sigmoidoscopy every 5 years or a colonoscopy every 10 years starting at age 41. Prostate cancer screening. Recommendations will vary depending  on your family history and other risks. Hepatitis C blood test. Hepatitis B blood test. Sexually transmitted disease (STD) testing. Diabetes screening. This is done by checking your blood sugar (glucose) after you have not eaten for a while (fasting). You may have this done every 1-3 years. Abdominal aortic aneurysm (AAA) screening. You may need this if you are a current or former smoker. Osteoporosis. You may be screened starting at age 71 if you are at high risk. Talk with your health care provider about your test results, treatment options, and if necessary, the need for more tests. Vaccines  Your health care provider may recommend certain vaccines, such as: Influenza vaccine. This is recommended every year. Tetanus, diphtheria, and acellular pertussis (Tdap, Td) vaccine. You may need a Td booster every 10 years. Zoster vaccine. You may need this after age 73. Pneumococcal 13-valent conjugate (PCV13) vaccine. One dose is recommended after age 76. Pneumococcal polysaccharide (PPSV23) vaccine. One dose is recommended after age 64. Talk to your health care provider about which screenings and vaccines you need and how often you need them. This information is not intended to replace advice given to you by your health care provider. Make sure you discuss any questions you have with your health care provider. Document Released: 09/08/2015 Document Revised: 05/01/2016 Document Reviewed: 06/13/2015 Elsevier Interactive Patient Education  2017 [employer].  Fall Prevention in the Home Falls can cause injuries. They can happen to people of all ages. There are many things you can do to make your home safe and to help prevent falls. What can I do on the outside of my home? Regularly fix the edges of walkways and driveways and fix any cracks. Remove anything that might make you trip as you walk through a door, such as a raised step or threshold. Trim any bushes or trees on the path to your home. Use  bright outdoor lighting. Clear any walking paths of anything that might make someone trip, such as rocks or tools. Regularly check to see if handrails are loose or broken. Make sure that both sides of any steps have handrails. Any raised decks and porches should have guardrails on the edges. Have any leaves, snow, or ice cleared regularly. Use sand or salt on walking paths during winter. Clean up any spills in your garage right away. This includes oil or grease spills. What can I do in the bathroom? Use night lights. Install grab bars by the toilet and in the tub and shower. Do not use towel bars as grab bars. Use non-skid mats or decals in the tub or shower. If you need to sit down in the shower, use a plastic, non-slip stool. Keep the floor dry. Clean up any water that spills on the floor as soon as it happens. Remove soap buildup in the tub or shower regularly. Attach bath mats securely with double-sided non-slip rug tape. Do not have throw rugs and other things on the floor that can make you trip. What can I do in the bedroom? Use night lights. Make sure that you have  a light by your bed that is easy to reach. Do not use any sheets or blankets that are too big for your bed. They should not hang down onto the floor. Have a firm chair that has side arms. You can use this for support while you get dressed. Do not have throw rugs and other things on the floor that can make you trip. What can I do in the kitchen? Clean up any spills right away. Avoid walking on wet floors. Keep items that you use a lot in easy-to-reach places. If you need to reach something above you, use a strong step stool that has a grab bar. Keep electrical cords out of the way. Do not use floor polish or wax that makes floors slippery. If you must use wax, use non-skid floor wax. Do not have throw rugs and other things on the floor that can make you trip. What can I do with my stairs? Do not leave any items on the  stairs. Make sure that there are handrails on both sides of the stairs and use them. Fix handrails that are broken or loose. Make sure that handrails are as long as the stairways. Check any carpeting to make sure that it is firmly attached to the stairs. Fix any carpet that is loose or worn. Avoid having throw rugs at the top or bottom of the stairs. If you do have throw rugs, attach them to the floor with carpet tape. Make sure that you have a light switch at the top of the stairs and the bottom of the stairs. If you do not have them, ask someone to add them for you. What else can I do to help prevent falls? Wear shoes that: Do not have high heels. Have rubber bottoms. Are comfortable and fit you well. Are closed at the toe. Do not wear sandals. If you use a stepladder: Make sure that it is fully opened. Do not climb a closed stepladder. Make sure that both sides of the stepladder are locked into place. Ask someone to hold it for you, if possible. Clearly mark and make sure that you can see: Any grab bars or handrails. First and last steps. Where the edge of each step is. Use tools that help you move around (mobility aids) if they are needed. These include: Canes. Walkers. Scooters. Crutches. Turn on the lights when you go into a dark area. Replace any light bulbs as soon as they burn out. Set up your furniture so you have a clear path. Avoid moving your furniture around. If any of your floors are uneven, fix them. If there are any pets around you, be aware of where they are. Review your medicines with your doctor. Some medicines can make you feel dizzy. This can increase your chance of falling. Ask your doctor what other things that you can do to help prevent falls. This information is not intended to replace advice given to you by your health care provider. Make sure you discuss any questions you have with your health care provider. Document Released: 06/08/2009 Document Revised:  01/18/2016 Document Reviewed: 09/16/2014 Elsevier Interactive Patient Education  2017 [employer].

## 2023-01-30 ENCOUNTER — Encounter (INDEPENDENT_AMBULATORY_CARE_PROVIDER_SITE_OTHER): Payer: Self-pay | Admitting: *Deleted

## 2023-02-04 ENCOUNTER — Ambulatory Visit: Payer: 59 | Admitting: Family Medicine

## 2023-02-21 ENCOUNTER — Other Ambulatory Visit: Payer: Self-pay | Admitting: Family Medicine

## 2023-03-19 ENCOUNTER — Encounter: Payer: Self-pay | Admitting: Family Medicine

## 2023-03-19 ENCOUNTER — Ambulatory Visit (INDEPENDENT_AMBULATORY_CARE_PROVIDER_SITE_OTHER): Payer: 59 | Admitting: Family Medicine

## 2023-03-19 VITALS — BP 124/80 | HR 96 | Ht 63.0 in | Wt 146.1 lb

## 2023-03-19 DIAGNOSIS — F10288 Alcohol dependence with other alcohol-induced disorder: Secondary | ICD-10-CM

## 2023-03-19 DIAGNOSIS — I10 Essential (primary) hypertension: Secondary | ICD-10-CM | POA: Diagnosis not present

## 2023-03-19 DIAGNOSIS — Z0001 Encounter for general adult medical examination with abnormal findings: Secondary | ICD-10-CM | POA: Diagnosis not present

## 2023-03-19 DIAGNOSIS — E785 Hyperlipidemia, unspecified: Secondary | ICD-10-CM

## 2023-03-19 MED ORDER — AMLODIPINE BESYLATE 5 MG PO TABS
5.0000 mg | ORAL_TABLET | Freq: Every day | ORAL | 5 refills | Status: DC
Start: 2023-03-19 — End: 2023-08-19

## 2023-03-19 MED ORDER — PANTOPRAZOLE SODIUM 40 MG PO TBEC: 40.0000 mg | DELAYED_RELEASE_TABLET | Freq: Every day | ORAL | 5 refills | Status: DC

## 2023-03-19 MED ORDER — FOLIC ACID 1 MG PO TABS: 1.0000 mg | ORAL_TABLET | Freq: Every day | ORAL | 6 refills | Status: DC

## 2023-03-19 NOTE — Patient Instructions (Signed)
F/U in 3 months, call if you need me before  Please get shingrix vaccines at the pharmacy  Pls filll out  questionnaire for colonoscopy,  and send it in to the odffice   so you get your colonoscopy scheduled   Pls stop snuff and do not use alcohol to dim your pain, stay close to family and friend who will help you through your grief ad who mean you well  Once you finish the vit D that you have , you will not have to take anymore for the rest  of this year  Fasting lipid, cmp and EGFR 3 to 5 days before next visit  Thanks for choosing [facility], we consider it a privelige to serve you.

## 2023-03-19 NOTE — Assessment & Plan Note (Signed)
Annual exam as documented. . Immunization and cancer screening needs are specifically addressed at this visit.  

## 2023-03-19 NOTE — Progress Notes (Signed)
      Alexander Simmons     MRN: [id number]      DOB: 02/11/1956  Chief Complaint  Patient presents with   Follow-up    Follow up stress with loss of sister he was staying with reports not resting, headaches, legs hurt at night   Annual Exam    HPI: Patient is in for annual physical exam. Concerns as above, grieving 2 month loss of his friend, increased alcohol intake, poor sleep at times, has support of family and friends  Immunization is reviewed , and  needs to be updated also needs to follow through to schedule colonoscopy, paperwork needs to be turned in   PE; BP 124/80 (BP Location: Right Arm, Patient Position: Sitting, Cuff Size: Normal)   Pulse 96   Ht 5\' 3"  (1.6 m)   Wt 146 lb 1.9 oz (66.3 kg)   SpO2 98%   BMI 25.88 kg/m   Pleasant male, alert and oriented x 3, in no cardio-pulmonary distress. Afebrile. HEENT No facial trauma or asymetry. Sinuses non tender. EOMI External ears normal,  Neck: decreased ROM, no adenopathy,JVD or thyromegaly.No bruits.  Chest: Clear to ascultation bilaterally.No crackles or wheezes. Non tender to palpation  Cardiovascular system; Heart sounds normal,  S1 and  S2 ,no S3.  No murmur, or thrill.irregularly , irregular pulse .  Abdomen: Soft, non tender,     Musculoskeletal exam: Decreased  ROM of spine, hips , shoulders and knees.  Neurologic: Cranial nerves 2 to 12 intact. Power, tone ,sensation  normal throughout. No disturbance in gait. No tremor.  Skin: Intact, no ulceration, erythema , scaling or rash noted. Pigmentation normal throughout  Psych; Tearful at times and mildly depressed appearing when speaking of friend who just passed Assessment & Plan:  Encounter for Medicare annual examination with abnormal findings Annual exam as documented.   Immunization and cancer screening needs are specifically addressed at this visit.   Alcohol dependence (HCC) Spoke directly with pt with friend who accompanied him of  the need to refrain from and avoid alcohol as he is currently at very high risk of excessive drinking

## 2023-03-19 NOTE — Assessment & Plan Note (Signed)
Spoke directly with pt with friend who accompanied him of the need to refrain from and avoid alcohol as he is currently at very high risk of excessive drinking

## 2023-05-25 ENCOUNTER — Other Ambulatory Visit: Payer: Self-pay | Admitting: Family Medicine

## 2023-05-30 ENCOUNTER — Other Ambulatory Visit: Payer: Self-pay | Admitting: Family Medicine

## 2023-05-30 DIAGNOSIS — Z1212 Encounter for screening for malignant neoplasm of rectum: Secondary | ICD-10-CM

## 2023-05-30 DIAGNOSIS — Z1211 Encounter for screening for malignant neoplasm of colon: Secondary | ICD-10-CM

## 2023-06-19 ENCOUNTER — Encounter: Payer: 59 | Admitting: Family Medicine

## 2023-06-20 ENCOUNTER — Encounter: Payer: 59 | Admitting: Family Medicine

## 2023-06-25 ENCOUNTER — Other Ambulatory Visit: Payer: Self-pay | Admitting: Family Medicine

## 2023-07-30 DIAGNOSIS — I10 Essential (primary) hypertension: Secondary | ICD-10-CM | POA: Diagnosis not present

## 2023-07-30 DIAGNOSIS — E785 Hyperlipidemia, unspecified: Secondary | ICD-10-CM | POA: Diagnosis not present

## 2023-07-31 ENCOUNTER — Encounter: Payer: 59 | Admitting: Family Medicine

## 2023-07-31 LAB — CMP14+EGFR
ALT: 29 [IU]/L (ref 0–44)
AST: 71 [IU]/L — ABNORMAL HIGH (ref 0–40)
Albumin: 3.8 g/dL — ABNORMAL LOW (ref 3.9–4.9)
Alkaline Phosphatase: 101 [IU]/L (ref 44–121)
BUN/Creatinine Ratio: 6 — ABNORMAL LOW (ref 10–24)
BUN: 7 mg/dL — ABNORMAL LOW (ref 8–27)
Bilirubin Total: 0.6 mg/dL (ref 0.0–1.2)
CO2: 23 mmol/L (ref 20–29)
Calcium: 8.8 mg/dL (ref 8.6–10.2)
Chloride: 102 mmol/L (ref 96–106)
Creatinine, Ser: 1.12 mg/dL (ref 0.76–1.27)
Globulin, Total: 2.8 g/dL (ref 1.5–4.5)
Glucose: 119 mg/dL — ABNORMAL HIGH (ref 70–99)
Potassium: 3.5 mmol/L (ref 3.5–5.2)
Sodium: 141 mmol/L (ref 134–144)
Total Protein: 6.6 g/dL (ref 6.0–8.5)
eGFR: 72 mL/min/{1.73_m2} (ref >59)

## 2023-07-31 LAB — LIPID PANEL
Chol/HDL Ratio: 2.1 {ratio} (ref 0.0–5.0)
Cholesterol, Total: 114 mg/dL (ref 100–199)
HDL: 55 mg/dL (ref >39)
LDL Chol Calc (NIH): 37 mg/dL (ref 0–99)
Triglycerides: 124 mg/dL (ref 0–149)
VLDL Cholesterol Cal: 22 mg/dL (ref 5–40)

## 2023-08-05 ENCOUNTER — Encounter (INDEPENDENT_AMBULATORY_CARE_PROVIDER_SITE_OTHER): Payer: Self-pay | Admitting: *Deleted

## 2023-08-05 ENCOUNTER — Other Ambulatory Visit: Payer: Self-pay | Admitting: Family Medicine

## 2023-08-19 ENCOUNTER — Other Ambulatory Visit: Payer: Self-pay

## 2023-08-19 DIAGNOSIS — I10 Essential (primary) hypertension: Secondary | ICD-10-CM

## 2023-08-19 MED ORDER — FOLIC ACID 1 MG PO TABS: 1.0000 mg | ORAL_TABLET | Freq: Every day | ORAL | 2 refills | Status: DC

## 2023-08-19 MED ORDER — APIXABAN 5 MG PO TABS: 5.0000 mg | ORAL_TABLET | Freq: Two times a day (BID) | ORAL | 2 refills | Status: DC

## 2023-08-19 MED ORDER — HYDROXYZINE HCL 10 MG PO TABS: ORAL_TABLET | ORAL | 2 refills | Status: DC

## 2023-08-19 MED ORDER — PANTOPRAZOLE SODIUM 40 MG PO TBEC: 40.0000 mg | DELAYED_RELEASE_TABLET | Freq: Every day | ORAL | 2 refills | Status: DC

## 2023-08-19 MED ORDER — FENOFIBRATE 145 MG PO TABS: 145.0000 mg | ORAL_TABLET | Freq: Every day | ORAL | 0 refills | Status: DC

## 2023-08-19 MED ORDER — AMLODIPINE BESYLATE 5 MG PO TABS: 5.0000 mg | ORAL_TABLET | Freq: Every day | ORAL | 2 refills | Status: DC

## 2023-09-25 ENCOUNTER — Ambulatory Visit (INDEPENDENT_AMBULATORY_CARE_PROVIDER_SITE_OTHER): Payer: 59 | Admitting: Family Medicine

## 2023-09-25 ENCOUNTER — Encounter: Payer: Self-pay | Admitting: Family Medicine

## 2023-09-25 VITALS — BP 135/82 | HR 78 | Ht 63.0 in | Wt 147.1 lb

## 2023-09-25 DIAGNOSIS — I10 Essential (primary) hypertension: Secondary | ICD-10-CM

## 2023-09-25 DIAGNOSIS — E559 Vitamin D deficiency, unspecified: Secondary | ICD-10-CM

## 2023-09-25 DIAGNOSIS — I426 Alcoholic cardiomyopathy: Secondary | ICD-10-CM

## 2023-09-25 DIAGNOSIS — I4891 Unspecified atrial fibrillation: Secondary | ICD-10-CM

## 2023-09-25 DIAGNOSIS — E785 Hyperlipidemia, unspecified: Secondary | ICD-10-CM

## 2023-09-25 DIAGNOSIS — Z0001 Encounter for general adult medical examination with abnormal findings: Secondary | ICD-10-CM | POA: Diagnosis not present

## 2023-09-25 MED ORDER — AZELASTINE HCL 0.1 % NA SOLN: 2.0000 | Freq: Two times a day (BID) | NASAL | 5 refills | Status: DC

## 2023-09-25 NOTE — Patient Instructions (Signed)
F/U early Septemeber, flu vaccine at visit, call if you need me sooner  Labs today, cBC, lipid, cmp and eGFr, tSH and vit d   You need a covid vaccine, this is at your pharmacy  PLEASE do send in stool for  colon cancer screening, very important.  You are being referred to Cardiology for follow up  CONGRATS on stopping alcohol , and all the very best for 2025!  Thanks for choosing [facility], we consider it a privelige to serve you.

## 2023-09-26 LAB — LIPID PANEL
Chol/HDL Ratio: 2.5 {ratio} (ref 0.0–5.0)
Cholesterol, Total: 159 mg/dL (ref 100–199)
HDL: 64 mg/dL (ref >39)
LDL Chol Calc (NIH): 75 mg/dL (ref 0–99)
Triglycerides: 113 mg/dL (ref 0–149)
VLDL Cholesterol Cal: 20 mg/dL (ref 5–40)

## 2023-09-26 LAB — CMP14+EGFR
ALT: 33 [IU]/L (ref 0–44)
AST: 76 [IU]/L — ABNORMAL HIGH (ref 0–40)
Albumin: 4 g/dL (ref 3.9–4.9)
Alkaline Phosphatase: 99 [IU]/L (ref 44–121)
BUN/Creatinine Ratio: 7 — ABNORMAL LOW (ref 10–24)
BUN: 9 mg/dL (ref 8–27)
Bilirubin Total: 0.6 mg/dL (ref 0.0–1.2)
CO2: 19 mmol/L — ABNORMAL LOW (ref 20–29)
Calcium: 9.5 mg/dL (ref 8.6–10.2)
Chloride: 103 mmol/L (ref 96–106)
Creatinine, Ser: 1.34 mg/dL — ABNORMAL HIGH (ref 0.76–1.27)
Globulin, Total: 2.9 g/dL (ref 1.5–4.5)
Glucose: 85 mg/dL (ref 70–99)
Potassium: 4.3 mmol/L (ref 3.5–5.2)
Sodium: 142 mmol/L (ref 134–144)
Total Protein: 6.9 g/dL (ref 6.0–8.5)
eGFR: 57 mL/min/{1.73_m2} — ABNORMAL LOW (ref >59)

## 2023-09-26 LAB — CBC
Hematocrit: 39.3 % (ref 37.5–51.0)
Hemoglobin: 13 g/dL (ref 13.0–17.7)
MCH: 34.3 pg — ABNORMAL HIGH (ref 26.6–33.0)
MCHC: 33.1 g/dL (ref 31.5–35.7)
MCV: 104 fL — ABNORMAL HIGH (ref 79–97)
Platelets: 284 10*3/uL (ref 150–450)
RBC: 3.79 x10E6/uL — ABNORMAL LOW (ref 4.14–5.80)
RDW: 13.7 % (ref 11.6–15.4)
WBC: 5.6 10*3/uL (ref 3.4–10.8)

## 2023-09-26 LAB — VITAMIN D 25 HYDROXY (VIT D DEFICIENCY, FRACTURES): Vit D, 25-Hydroxy: 54.4 ng/mL (ref 30.0–100.0)

## 2023-09-26 LAB — TSH: TSH: 3.16 u[IU]/mL (ref 0.450–4.500)

## 2023-09-28 NOTE — Assessment & Plan Note (Signed)
Cardiology evaluation and follow up needed

## 2023-09-28 NOTE — Assessment & Plan Note (Signed)
Annual exam as documented. . Immunization and cancer screening needs are specifically addressed at this visit.  

## 2023-09-28 NOTE — Assessment & Plan Note (Signed)
Needs to re establish with cardiology, currently stable and reports abstinence from alcohol x 3 weeks

## 2023-09-28 NOTE — Progress Notes (Signed)
   Alexander Simmons     MRN: [id number]      DOB: 25-Dec-1955  Chief Complaint  Patient presents with   Annual Exam    CPE patient reports being out of one of his meds can't remember name of med    HPI: Patient is in for annual physical exam. Labs are drawn on day of visit and reviewed following visit , no new medication  Immunization is reviewed , and  needs current Covid vaccine.    PE; BP 135/82 (BP Location: Right Arm, Patient Position: Sitting, Cuff Size: Large)   Pulse 78   Ht 5\' 3"  (1.6 m)   Wt 147 lb 1.3 oz (66.7 kg)   SpO2 97%   BMI 26.05 kg/m   Pleasant male, alert and oriented x 3, in no cardio-pulmonary distress. Afebrile. HEENT No facial trauma or asymetry. Sinuses non tender. EOMI External ears normal,  Neck: supple, no adenopathy,JVD or thyromegaly.No bruits.  Chest: Clear to ascultation bilaterally.No crackles or wheezes. Non tender to palpation  Cardiovascular system; Heart sounds normal,  S1 and  S2 ,no S3.  No murmur, or thrill.Irregularly irregular pulse Abdomen: Soft, non tender  Musculoskeletal exam: Adequate ROM of spine, hips , shoulders and knees. No deformity ,swelling or crepitus noted. No muscle wasting or atrophy.   Neurologic: Cranial nerves 2 to 12 intact. Power, tone ,sensation  normal throughout. No disturbance in gait. No tremor.  Skin: Intact, no ulceration, erythema , scaling or rash noted. Pigmentation normal throughout  Psych; Normal mood and affect.  Assessment & Plan:  Encounter for Medicare annual examination with abnormal findings Annual exam as documented. Immunization and cancer screening needs are specifically addressed at this visit.   Alcoholic cardiomyopathy (HCC) Needs to re establish with cardiology, currently stable and reports abstinence from alcohol x 3 weeks  Atrial fibrillation with controlled ventricular response [facility]) Cardiology evaluation and follow up needed

## 2023-11-04 LAB — FECAL OCCULT BLOOD, IMMUNOCHEMICAL: IFOBT: POSITIVE

## 2023-11-11 ENCOUNTER — Telehealth: Payer: Self-pay

## 2023-11-11 NOTE — Telephone Encounter (Signed)
 Copied from CRM [id number]. Topic: Clinical - Lab/Test Results >> Nov 10, 2023  1:34 PM Abundio Miu S wrote: Reason for CRM: Ramira with [employer] , calling to relay abnormal lab results. Contacted and transferred to CAL.    Callback # [phone] Monday-Friday 9-6 pm EST

## 2023-11-13 NOTE — Telephone Encounter (Signed)
 FYIMaralyn Simmons w/ [employer] called to report a abnormal lab result   At a recent wellness visit a Fecal sample was collected and an  abnormal SOBT was found.  The  Company is faxing over lab results .   Company is requesting that if additional screening needs to be completed for this office to please order the testing.

## 2023-11-13 NOTE — Telephone Encounter (Signed)
 Positive colon screen test and colonoscopy past due explain to opt he NEEDS to see GI specialist for colonoscopy. If he agrees pls refer I will sign ( use most recent GI Dr he has seen Thank uyou

## 2023-11-14 ENCOUNTER — Encounter: Payer: Self-pay | Admitting: Family Medicine

## 2023-11-14 NOTE — Telephone Encounter (Signed)
Called patient and left message for them to return call at the office   

## 2023-12-18 ENCOUNTER — Ambulatory Visit: Payer: 59 | Admitting: Internal Medicine

## 2023-12-23 ENCOUNTER — Other Ambulatory Visit: Payer: Self-pay

## 2023-12-28 ENCOUNTER — Other Ambulatory Visit: Payer: Self-pay | Admitting: Family Medicine

## 2024-02-03 ENCOUNTER — Ambulatory Visit: Payer: 59

## 2024-02-03 VITALS — Ht 63.0 in | Wt 145.0 lb

## 2024-02-03 DIAGNOSIS — Z0000 Encounter for general adult medical examination without abnormal findings: Secondary | ICD-10-CM

## 2024-02-03 NOTE — Patient Instructions (Signed)
 [name] , Thank you for taking time out of your busy schedule to complete your Annual Wellness Visit with me. I enjoyed our conversation and look forward to speaking with you again next year. I, as well as your care team,  appreciate your ongoing commitment to your health goals. Please review the following plan we discussed and let me know if I can assist you in the future. Your Game plan/ To Do List    Referrals Placed: n/a   Labs ordered for you: n/a Follow up Visits: Next Medicare AWV with clinical staff:February 07, 2025 at 12:30 pm telephone Have you seen your provider in the last 6 months (3 months if uncontrolled diabetes)? Yes Next Office Visit with your Primary Care Doctor: April 28, 2024 in office Clinician Recommendations:   Aim for 30 minutes of exercise or brisk walking, 6-8 glasses of water , and 5 servings of fruits and vegetables each day.  I enjoyed our conversation today and look forward to talking with you again next year!! Have a wonderful and safe year.  All the best, Asahel Risden  This is a list of the screening recommended for you and due dates:  Health Maintenance  Topic Date Due   Colon Cancer Screening  01/29/2023   COVID-19 Vaccine (2 - 2024-25 season) 07/30/2023   Flu Shot  03/26/2024   Medicare Annual Wellness Visit  02/02/2025   DTaP/Tdap/Td vaccine (3 - Td or Tdap) 06/27/2026   Pneumonia Vaccine  Completed   Hepatitis C Screening  Completed   Zoster (Shingles) Vaccine  Completed   HPV Vaccine  Aged Out   Meningitis B Vaccine  Aged Out    Advanced directives: (Declined) Advance directive discussed with you today. Even though you declined this today, please call our office should you change your mind, and we can give you the proper paperwork for you to fill out. Advance Care Planning is important because it:  [x]  Makes sure you receive the medical care that is consistent with your values, goals, and preferences  [x]  It provides guidance to your family and  loved ones and reduces their decisional burden about whether or not they are making the right decisions based on your wishes.  Follow the link provided in your after visit summary or read over the paperwork we have mailed to you to help you started getting your Advance Directives in place. If you need assistance in completing these, please reach out to us  so that we can help you!  See attachments for Preventive Care and Fall Prevention Tips.  Understanding Your Risk for Falls Millions of people have serious injuries from falls each year. It is important to understand your risk of falling. Talk with your health care provider about your risk and what you can do to lower it. If you do have a serious fall, make sure to tell your provider. Falling once raises your risk of falling again. How can falls affect me? Serious injuries from falls are common. These include: Broken bones, such as hip fractures. Head injuries, such as traumatic brain injuries (TBI) or concussions. A fear of falling can cause you to avoid activities and stay at home. This can make your muscles weaker and raise your risk for a fall. What can increase my risk? There are a number of risk factors that increase your risk for falling. The more risk factors you have, the higher your risk of falling. Serious injuries from a fall happen most often to people who are older than 69  years old. Teenagers and young adults ages 94-29 are also at higher risk. Common risk factors include: Weakness in the lower body. Being generally weak or confused due to long-term (chronic) illness. Dizziness or balance problems. Poor vision. Medicines that cause dizziness or drowsiness. These may include: Medicines for your blood pressure, heart, anxiety, insomnia, or swelling (edema). Pain medicines. Muscle relaxants. Other risk factors include: Drinking alcohol. Having had a fall in the past. Having foot pain or wearing improper footwear. Working at a  dangerous job. Having any of the following in your home: Tripping hazards, such as floor clutter or loose rugs. Poor lighting. Pets. Having dementia or memory loss. What actions can I take to lower my risk of falling?     Physical activity Stay physically fit. Do strength and balance exercises. Consider taking a regular class to build strength and balance. Yoga and tai chi are good options. Vision Have your eyes checked every year and your prescription for glasses or contacts updated as needed. Shoes and walking aids Wear non-skid shoes. Wear shoes that have rubber soles and low heels. Do not wear high heels. Do not walk around the house in socks or slippers. Use a cane or walker as told by your provider. Home safety Attach secure railings on both sides of your stairs. Install grab bars for your bathtub, shower, and toilet. Use a non-skid mat in your bathtub or shower. Attach bath mats securely with double-sided, non-slip rug tape. Use good lighting in all rooms. Keep a flashlight near your bed. Make sure there is a clear path from your bed to the bathroom. Use night-lights. Do not use throw rugs. Make sure all carpeting is taped or tacked down securely. Remove all clutter from walkways and stairways, including extension cords. Repair uneven or broken steps and floors. Avoid walking on icy or slippery surfaces. Walk on the grass instead of on icy or slick sidewalks. Use ice melter to get rid of ice on walkways in the winter. Use a cordless phone. Questions to ask your health care provider Can you help me check my risk for a fall? Do any of my medicines make me more likely to fall? Should I take a vitamin D  supplement? What exercises can I do to improve my strength and balance? Should I make an appointment to have my vision checked? Do I need a bone density test to check for weak bones (osteoporosis)? Would it help to use a cane or a walker? Where to find more  information Centers for Disease Control and Prevention, STEADI: TonerPromos.no Community-Based Fall Prevention Programs: TonerPromos.no [employer] on Aging: BaseRingTones.pl Contact a health care provider if: You fall at home. You are afraid of falling at home. You feel weak, drowsy, or dizzy. This information is not intended to replace advice given to you by your health care provider. Make sure you discuss any questions you have with your health care provider. Document Revised: 04/15/2022 Document Reviewed: 04/15/2022 Elsevier Patient Education  2024 [employer].  Understanding Your Risk for Falls Millions of people have serious injuries from falls each year. It is important to understand your risk of falling. Talk with your health care provider about your risk and what you can do to lower it. If you do have a serious fall, make sure to tell your provider. Falling once raises your risk of falling again. How can falls affect me? Serious injuries from falls are common. These include: Broken bones, such as hip fractures. Head injuries, such as traumatic  brain injuries (TBI) or concussions. A fear of falling can cause you to avoid activities and stay at home. This can make your muscles weaker and raise your risk for a fall. What can increase my risk? There are a number of risk factors that increase your risk for falling. The more risk factors you have, the higher your risk of falling. Serious injuries from a fall happen most often to people who are older than 69 years old. Teenagers and young adults ages 37-29 are also at higher risk. Common risk factors include: Weakness in the lower body. Being generally weak or confused due to long-term (chronic) illness. Dizziness or balance problems. Poor vision. Medicines that cause dizziness or drowsiness. These may include: Medicines for your blood pressure, heart, anxiety, insomnia, or swelling (edema). Pain medicines. Muscle relaxants. Other risk factors  include: Drinking alcohol. Having had a fall in the past. Having foot pain or wearing improper footwear. Working at a dangerous job. Having any of the following in your home: Tripping hazards, such as floor clutter or loose rugs. Poor lighting. Pets. Having dementia or memory loss. What actions can I take to lower my risk of falling?     Physical activity Stay physically fit. Do strength and balance exercises. Consider taking a regular class to build strength and balance. Yoga and tai chi are good options. Vision Have your eyes checked every year and your prescription for glasses or contacts updated as needed. Shoes and walking aids Wear non-skid shoes. Wear shoes that have rubber soles and low heels. Do not wear high heels. Do not walk around the house in socks or slippers. Use a cane or walker as told by your provider. Home safety Attach secure railings on both sides of your stairs. Install grab bars for your bathtub, shower, and toilet. Use a non-skid mat in your bathtub or shower. Attach bath mats securely with double-sided, non-slip rug tape. Use good lighting in all rooms. Keep a flashlight near your bed. Make sure there is a clear path from your bed to the bathroom. Use night-lights. Do not use throw rugs. Make sure all carpeting is taped or tacked down securely. Remove all clutter from walkways and stairways, including extension cords. Repair uneven or broken steps and floors. Avoid walking on icy or slippery surfaces. Walk on the grass instead of on icy or slick sidewalks. Use ice melter to get rid of ice on walkways in the winter. Use a cordless phone. Questions to ask your health care provider Can you help me check my risk for a fall? Do any of my medicines make me more likely to fall? Should I take a vitamin D  supplement? What exercises can I do to improve my strength and balance? Should I make an appointment to have my vision checked? Do I need a bone density test  to check for weak bones (osteoporosis)? Would it help to use a cane or a walker? Where to find more information Centers for Disease Control and Prevention, STEADI: TonerPromos.no Community-Based Fall Prevention Programs: TonerPromos.no [employer] on Aging: BaseRingTones.pl Contact a health care provider if: You fall at home. You are afraid of falling at home. You feel weak, drowsy, or dizzy. This information is not intended to replace advice given to you by your health care provider. Make sure you discuss any questions you have with your health care provider. Document Revised: 04/15/2022 Document Reviewed: 04/15/2022 Elsevier Patient Education  2024 [employer].

## 2024-02-03 NOTE — Progress Notes (Signed)
 Patient is overdue for colonoscopy. Cologuard has been ordered for him in the past but has not been completed. Patient never responded to correspondence from GI referral.   Subjective:   Alexander Simmons is a 68 y.o. who presents for a Medicare Wellness preventive visit.  As a reminder, Annual Wellness Visits don't include a physical exam, and some assessments may be limited, especially if this visit is performed virtually. We may recommend an in-person follow-up visit with your provider if needed.  Visit Complete: Virtual I connected with  Betha Brookes on 02/03/24 by a audio enabled telemedicine application and verified that I am speaking with the correct person using two identifiers.  Patient Location: Home  Provider Location: Home Office  I discussed the limitations of evaluation and management by telemedicine. The patient expressed understanding and agreed to proceed.  Vital Signs: Because this visit was a virtual/telehealth visit, some criteria may be missing or patient reported. Any vitals not documented were not able to be obtained and vitals that have been documented are patient reported.  VideoDeclined- This patient declined Librarian, academic. Therefore the visit was completed with audio only.  Persons Participating in Visit: Patient.  AWV Questionnaire: No: Patient Medicare AWV questionnaire was not completed prior to this visit.  Cardiac Risk Factors include: advanced age (>61men, >28 women);dyslipidemia;hypertension;male gender     Objective:     Today's Vitals   02/03/24 1626  Weight: 145 lb (65.8 kg)  Height: 5\' 3"  (1.6 m)   Body mass index is 25.69 kg/m.     02/03/2024    4:30 PM 01/29/2023    2:09 PM 03/25/2022    6:22 PM 10/08/2021    9:49 AM 10/03/2020    1:56 PM 09/05/2020    2:23 PM 01/15/2020    9:20 PM  Advanced Directives  Does Patient Have a Medical Advance Directive? No No No No No No No  Would patient like information  on creating a medical advance directive? No - Patient declined No - Patient declined No - Patient declined Yes (ED - Information included in AVS) No - Patient declined No - Patient declined     Current Medications (verified) Outpatient Encounter Medications as of 02/03/2024  Medication Sig   amLODipine  (NORVASC ) 5 MG tablet Take 1 tablet (5 mg total) by mouth daily.   apixaban  (ELIQUIS ) 5 MG TABS tablet Take 1 tablet (5 mg total) by mouth 2 (two) times daily.   azelastine  (ASTELIN ) 0.1 % nasal spray Place 2 sprays into both nostrils 2 (two) times daily. Use in each nostril as directed   fenofibrate  (TRICOR ) 145 MG tablet Take 1 tablet by mouth once daily   folic acid  (FOLVITE ) 1 MG tablet Take 1 tablet (1 mg total) by mouth daily.   hydrOXYzine  (ATARAX ) 10 MG tablet TAKE 1 TABLET BY MOUTH AT BEDTIME   pantoprazole  (PROTONIX ) 40 MG tablet Take 1 tablet (40 mg total) by mouth daily.   No facility-administered encounter medications on file as of 02/03/2024.    Allergies (verified) Patient has no known allergies.   History: Past Medical History:  Diagnosis Date   Alcohol abuse    Allergy    Cor pulmonale (HCC)    By echocardiography   Glaucoma    Humerus head fracture-left 2021   Hyperlipidemia    Hypertension    Small bowel obstruction (HCC)    Syncope 12/24/2009   Attributed to use of narcotics plus alcohol   Tobacco abuse  Past Surgical History:  Procedure Laterality Date   ABDOMINAL SURGERY     COLONOSCOPY N/A 01/28/2013   Procedure: COLONOSCOPY;  Surgeon: Ruby Corporal, MD;  Location: [facility];  Service: Endoscopy;  Laterality: N/A;  1225   IR ANGIO INTRA EXTRACRAN SEL COM CAROTID INNOMINATE BILAT MOD SED  03/25/2022   IR ANGIO VERTEBRAL SEL SUBCLAVIAN INNOMINATE UNI R MOD SED  03/27/2022   PILONIDAL CYST EXCISION  2005   Dr. Merna Aase   RADIOLOGY WITH ANESTHESIA N/A 03/25/2022   Procedure: IR WITH ANESTHESIA;  Surgeon: Radiologist, Medication, MD;  Location: [facility] OR;   Service: Radiology;  Laterality: N/A;   TOOTH EXTRACTION     Family History  Problem Relation Age of Onset   Other Other        FH unknown-patient raised in foster care system   Colon cancer Neg Hx    Social History   Socioeconomic History   Marital status: Single    Spouse name: Not on file   Number of children: 1   Years of education: 62   Highest education level: 12th grade  Occupational History   Occupation: disabled   Tobacco Use   Smoking status: Never   Smokeless tobacco: Current    Types: Snuff, Chew   Tobacco comments:    chews tobacco since age 58.  Vaping Use   Vaping status: Former  Substance and Sexual Activity   Alcohol use: Yes    Alcohol/week: 20.0 standard drinks of alcohol    Types: 20 Shots of liquor per week    Comment: 1 pint of gin daily (4cups daily)   Drug use: No   Sexual activity: Not Currently  Other Topics Concern   Not on file  Social History Narrative   Lives with his niece    Social Drivers of Health   Financial Resource Strain: Low Risk  (02/03/2024)   Overall Financial Resource Strain (CARDIA)    Difficulty of Paying Living Expenses: Not hard at all  Food Insecurity: No Food Insecurity (02/03/2024)   Hunger Vital Sign    Worried About Running Out of Food in the Last Year: Never true    Ran Out of Food in the Last Year: Never true  Transportation Needs: No Transportation Needs (02/03/2024)   PRAPARE - Administrator, Civil Service (Medical): No    Lack of Transportation (Non-Medical): No  Physical Activity: Sufficiently Active (02/03/2024)   Exercise Vital Sign    Days of Exercise per Week: 7 days    Minutes of Exercise per Session: 30 min  Stress: No Stress Concern Present (02/03/2024)   [employer] of Occupational Health - Occupational Stress Questionnaire    Feeling of Stress : Not at all  Social Connections: Unknown (02/03/2024)   Social Connection and Isolation Panel [NHANES]    Frequency of Communication with  Friends and Family: More than three times a week    Frequency of Social Gatherings with Friends and Family: More than three times a week    Attends Religious Services: More than 4 times per year    Active Member of [employer] or Organizations: No    Attends Banker Meetings: Never    Marital Status: Patient declined    Tobacco Counseling Ready to quit: Yes Counseling given: Yes Tobacco comments: chews tobacco since age 21.    Clinical Intake:  Pre-visit preparation completed: Yes  Pain : No/denies pain     BMI - recorded: 25.69 Nutritional Status:  BMI 25 -29 Overweight Nutritional Risks: None Diabetes: No  Lab Results  Component Value Date   HGBA1C 5.3 03/26/2022   HGBA1C 5.6 05/23/2021   HGBA1C 5.7 (H) 01/03/2021     How often do you need to have someone help you when you read instructions, pamphlets, or other written materials from your doctor or pharmacy?: 5 - Always (unable to read or write)  Interpreter Needed?: No  Information entered by :: Sally Crazier CMA   Activities of Daily Living     02/03/2024    4:28 PM  In your present state of health, do you have any difficulty performing the following activities:  Hearing? 0  Vision? 0  Difficulty concentrating or making decisions? 0  Walking or climbing stairs? 0  Dressing or bathing? 0  Doing errands, shopping? 0  Preparing Food and eating ? N  Using the Toilet? N  In the past six months, have you accidently leaked urine? N  Do you have problems with loss of bowel control? N  Managing your Medications? N  Managing your Finances? N  Housekeeping or managing your Housekeeping? N    Patient Care Team: Towanda Fret, MD as PCP - General (Family Medicine) Rothbart, Windsor Hatcher, MD (Inactive) (Cardiology) Lucendia Rusk, DO (Optometry)  I have updated your Care Teams any recent Medical Services you may have received from other providers in the past year.     Assessment:    This is a routine  wellness examination for [place].  Hearing/Vision screen Hearing Screening - Comments:: Patient denies any hearing difficulties.    Vision Screening - Comments:: Patient wears reading glasses only. Up to date with yearly exams.  Patient sees Dr. Lucendia Rusk w/ [facility] office.     Goals Addressed             This Visit's Progress    Patient Stated       Patient stated I'd like to remain healthy and active       Depression Screen     02/03/2024    4:29 PM 09/25/2023    3:16 PM 03/19/2023    3:55 PM 01/29/2023    2:04 PM 10/10/2022    1:30 PM 07/26/2022    1:58 PM 06/17/2022    3:10 PM  PHQ 2/9 Scores  PHQ - 2 Score 0 0 2 0 0 0 0  PHQ- 9 Score 0  7        Fall Risk     02/03/2024    4:31 PM 09/25/2023    3:16 PM 03/19/2023    3:55 PM 01/29/2023    2:04 PM 10/10/2022    1:30 PM  Fall Risk   Falls in the past year? 0 0 0 0 0  Number falls in past yr: 0 0 0 0 0  Injury with Fall? 0 0 0 0 0  Risk for fall due to : No Fall Risks No Fall Risks No Fall Risks No Fall Risks No Fall Risks  Follow up Falls evaluation completed Falls evaluation completed Falls evaluation completed Falls prevention discussed Falls evaluation completed    MEDICARE RISK AT HOME:  Medicare Risk at Home Any stairs in or around the home?: Yes If so, are there any without handrails?: No Home free of loose throw rugs in walkways, pet beds, electrical cords, etc?: Yes Adequate lighting in your home to reduce risk of falls?: Yes Life alert?: No Use of a cane, walker or w/c?: No  Grab bars in the bathroom?: No Shower chair or bench in shower?: No Elevated toilet seat or a handicapped toilet?: No  TIMED UP AND GO:  Was the test performed?  No  Cognitive Function: 6CIT completed        02/03/2024    4:31 PM 01/29/2023    2:09 PM 10/08/2021   11:21 AM 10/03/2020    1:57 PM 10/01/2019   10:40 AM  6CIT Screen  What Year? 0 points 0 points 0 points 0 points 0 points  What month? 0 points 0  points 0 points 0 points 0 points  What time? 0 points 0 points 0 points 0 points 0 points  Count back from 20 0 points 0 points   0 points  Months in reverse 0 points 0 points 4 points  4 points  Repeat phrase 0 points 0 points 2 points  2 points  Total Score 0 points 0 points   6 points    Immunizations Immunization History  Administered Date(s) Administered   Fluad Quad(high Dose 65+) 05/11/2020, 05/23/2021, 06/17/2022, 06/04/2023   Influenza Whole 08/19/2007, 06/05/2010   Influenza,inj,Quad PF,6+ Mos 06/01/2014, 06/27/2016, 09/23/2018, 05/10/2019   Moderna Covid-19 Vaccine Bivalent Booster 29yrs & up 06/04/2023   PNEUMOCOCCAL CONJUGATE-20 10/10/2021   Pneumococcal Conjugate-13 04/06/2014, 05/11/2020   Td 02/08/2004   Tdap 06/27/2016   Zoster Recombinant(Shingrix ) 10/10/2022, 06/23/2023    Screening Tests Health Maintenance  Topic Date Due   Colonoscopy  01/29/2023   COVID-19 Vaccine (2 - 2024-25 season) 07/30/2023   INFLUENZA VACCINE  03/26/2024   Medicare Annual Wellness (AWV)  02/02/2025   DTaP/Tdap/Td (3 - Td or Tdap) 06/27/2026   Pneumonia Vaccine 70+ Years old  Completed   Hepatitis C Screening  Completed   Zoster Vaccines- Shingrix   Completed   HPV VACCINES  Aged Out   Meningococcal B Vaccine  Aged Out    Health Maintenance  Health Maintenance Due  Topic Date Due   Colonoscopy  01/29/2023   COVID-19 Vaccine (2 - 2024-25 season) 07/30/2023   Health Maintenance Items Addressed: Discussed colonoscopy. Patient failed to respond to correspondence from GI referral. Has not returned Cologuard to be tested.  Additional Screening:  Vision Screening: Recommended annual ophthalmology exams for early detection of glaucoma and other disorders of the eye. Would you like a referral to an eye doctor? No    Dental Screening: Recommended annual dental exams for proper oral hygiene  Community Resource Referral / Chronic Care Management: CRR required this visit?  No    CCM required this visit?  No   Plan: Discussed colonoscopy. Patient failed to respond to correspondence from GI referral. Has not returned Cologuard to be tested. Message sent to provider    I have personally reviewed and noted the following in the patient's chart:   Medical and social history Use of alcohol, tobacco or illicit drugs  Current medications and supplements including opioid prescriptions. Patient is not currently taking opioid prescriptions. Functional ability and status Nutritional status Physical activity Advanced directives List of other physicians Hospitalizations, surgeries, and ER visits in previous 12 months Vitals Screenings to include cognitive, depression, and falls Referrals and appointments  In addition, I have reviewed and discussed with patient certain preventive protocols, quality metrics, and best practice recommendations. A written personalized care plan for preventive services as well as general preventive health recommendations were provided to patient.   Jacorey Donaway, CMA   02/03/2024   After Visit Summary: (Declined) Due to this being a telephonic  visit, with patients personalized plan was offered to patient but patient Declined AVS at this time   Notes: see info at top of progress note

## 2024-03-25 ENCOUNTER — Other Ambulatory Visit: Payer: Self-pay | Admitting: Family Medicine

## 2024-03-25 DIAGNOSIS — I10 Essential (primary) hypertension: Secondary | ICD-10-CM

## 2024-04-22 ENCOUNTER — Ambulatory Visit: Attending: Internal Medicine | Admitting: Internal Medicine

## 2024-04-22 NOTE — Progress Notes (Signed)
 Erroneous encounter - please disregard.

## 2024-04-28 ENCOUNTER — Ambulatory Visit: Payer: 59 | Admitting: Family Medicine

## 2024-05-23 ENCOUNTER — Other Ambulatory Visit: Payer: Self-pay | Admitting: Family Medicine

## 2024-05-23 DIAGNOSIS — I10 Essential (primary) hypertension: Secondary | ICD-10-CM

## 2024-07-14 ENCOUNTER — Ambulatory Visit: Attending: Internal Medicine | Admitting: Internal Medicine

## 2024-07-14 ENCOUNTER — Telehealth: Payer: Self-pay

## 2024-07-14 ENCOUNTER — Encounter: Payer: Self-pay | Admitting: Internal Medicine

## 2024-07-14 VITALS — BP 122/70 | HR 80 | Ht 61.0 in | Wt 134.2 lb

## 2024-07-14 DIAGNOSIS — I4891 Unspecified atrial fibrillation: Secondary | ICD-10-CM

## 2024-07-14 DIAGNOSIS — I5032 Chronic diastolic (congestive) heart failure: Secondary | ICD-10-CM | POA: Insufficient documentation

## 2024-07-14 DIAGNOSIS — I426 Alcoholic cardiomyopathy: Secondary | ICD-10-CM

## 2024-07-14 DIAGNOSIS — F1010 Alcohol abuse, uncomplicated: Secondary | ICD-10-CM

## 2024-07-14 DIAGNOSIS — Z7901 Long term (current) use of anticoagulants: Secondary | ICD-10-CM | POA: Diagnosis not present

## 2024-07-14 NOTE — Patient Instructions (Addendum)
 Medication Instructions:  Your physician recommends that you continue on your current medications as directed. Please refer to the Current Medication list given to you today.  *If you need a refill on your cardiac medications before your next appointment, please call your pharmacy*  Lab Work: None If you have labs (blood work) drawn today and your tests are completely normal, you will receive your results only by: MyChart Message (if you have MyChart) OR A paper copy in the mail If you have any lab test that is abnormal or we need to change your treatment, we will call you to review the results.  Testing/Procedures: Your physician has requested that you have an echocardiogram. Echocardiography is a painless test that uses sound waves to create images of your heart. It provides your doctor with information about the size and shape of your heart and how well your heart's chambers and valves are working. This procedure takes approximately one hour. There are no restrictions for this procedure. Please do NOT wear cologne, perfume, aftershave, or lotions (deodorant is allowed). Please arrive 15 minutes prior to your appointment time.  Please note: We ask at that you not bring children with you during ultrasound (echo/ vascular) testing. Due to room size and safety concerns, children are not allowed in the ultrasound rooms during exams. Our front office staff cannot provide observation of children in our lobby area while testing is being conducted. An adult accompanying a patient to their appointment will only be allowed in the ultrasound room at the discretion of the ultrasound technician under special circumstances. We apologize for any inconvenience.   Follow-Up: At [facility], you and your health needs are our priority.  As part of our continuing mission to provide you with exceptional heart care, our providers are all part of one team.  This team includes your primary Cardiologist  (physician) and Advanced Practice Providers or APPs (Physician Assistants and Nurse Practitioners) who all work together to provide you with the care you need, when you need it.  Your next appointment:   3 month(s)  Provider:   You may see Vishnu P Mallipeddi, MD or one of the following Advanced Practice Providers on your designated Care Team:   [place], PA-C  Scotesia Fowler, [place] Theotis Flake, [place]     We recommend signing up for the patient portal called "MyChart".  Sign up information is provided on this After Visit Summary.  MyChart is used to connect with patients for Virtual Visits (Telemedicine).  Patients are able to view lab/test results, encounter notes, upcoming appointments, etc.  Non-urgent messages can be sent to your provider as well.   To learn more about what you can do with MyChart, go to [email or web address].   Other Instructions

## 2024-07-14 NOTE — Telephone Encounter (Signed)
 Patient had OV with provider today and needs a refill of his Eliquis  5mg  tablets sent to his pharmacy on file

## 2024-07-14 NOTE — Progress Notes (Signed)
 Cardiology Office Note  Date: 07/14/2024   ID: Alexander Simmons, Alexander Simmons Jan 04, 1956, MRN [id number]  PCP:  Antonetta Rollene BRAVO, MD  Cardiologist:  None Electrophysiologist:  None   History of Present Illness: Alexander Simmons is a 68 y.o. male known to have persistent A-fib, alcohol abuse  Referred to cardiology clinic for the management of atrial fibrillation.  I reviewed the EKGs on the EMR that showed atrial fibrillation since July 2023.  He has been symptomatic with palpitations and intermittent SOB since then.  Denies having any angina.  He drinks alcohol daily.  Feels dizzy and falls at least once a week.  Currently not on any systemic anticoagulation.  Past Medical History:  Diagnosis Date   Alcohol abuse    Allergy    Cor pulmonale (HCC)    By echocardiography   Glaucoma    Humerus head fracture-left 2021   Hyperlipidemia    Hypertension    Small bowel obstruction (HCC)    Syncope 12/24/2009   Attributed to use of narcotics plus alcohol   Tobacco abuse     Past Surgical History:  Procedure Laterality Date   ABDOMINAL SURGERY     COLONOSCOPY N/A 01/28/2013   Procedure: COLONOSCOPY;  Surgeon: Claudis RAYMOND Rivet, MD;  Location: [facility];  Service: Endoscopy;  Laterality: N/A;  1225   IR ANGIO INTRA EXTRACRAN SEL COM CAROTID INNOMINATE BILAT MOD SED  03/25/2022   IR ANGIO VERTEBRAL SEL SUBCLAVIAN INNOMINATE UNI R MOD SED  03/27/2022   PILONIDAL CYST EXCISION  2005   Dr. Keven Sharps   RADIOLOGY WITH ANESTHESIA N/A 03/25/2022   Procedure: IR WITH ANESTHESIA;  Surgeon: Radiologist, Medication, MD;  Location: [facility] OR;  Service: Radiology;  Laterality: N/A;   TOOTH EXTRACTION      Current Outpatient Medications  Medication Sig Dispense Refill   amLODipine  (NORVASC ) 5 MG tablet Take 1 tablet by mouth once daily 30 tablet 0   azelastine  (ASTELIN ) 0.1 % nasal spray Place 2 sprays into both nostrils 2 (two) times daily. Use in each nostril as directed 30 mL 5   ELIQUIS  5 MG TABS  tablet Take 1 tablet by mouth twice daily 60 tablet 0   fenofibrate  (TRICOR ) 145 MG tablet Take 1 tablet by mouth once daily 90 tablet 0   folic acid  (FOLVITE ) 1 MG tablet Take 1 tablet by mouth once daily 30 tablet 0   hydrOXYzine  (ATARAX ) 10 MG tablet TAKE 1 TABLET BY MOUTH AT BEDTIME 30 tablet 0   pantoprazole  (PROTONIX ) 40 MG tablet Take 1 tablet by mouth once daily 30 tablet 0   No current facility-administered medications for this visit.   Allergies:  Patient has no known allergies.   Social History: The patient  reports that he has never smoked. His smokeless tobacco use includes snuff and chew. He reports current alcohol use of about 20.0 standard drinks of alcohol per week. He reports that he does not use drugs.   Family History: The patient's family history includes Other in an other family member.   ROS:  Please see the history of present illness. Otherwise, complete review of systems is positive for none.  All other systems are reviewed and negative.   Physical Exam: VS:  BP 122/70   Pulse 80   Ht 5' 1 (1.549 m)   Wt 134 lb 3.2 oz (60.9 kg)   SpO2 98%   BMI 25.36 kg/m , BMI Body mass index is 25.36 kg/m.  Wt Readings from  Last 3 Encounters:  07/14/24 134 lb 3.2 oz (60.9 kg)  02/03/24 145 lb (65.8 kg)  09/25/23 147 lb 1.3 oz (66.7 kg)    General: Patient appears comfortable at rest. HEENT: Conjunctiva and lids normal, oropharynx clear with moist mucosa. Neck: Supple, no elevated JVP or carotid bruits, no thyromegaly. Lungs: Clear to auscultation, nonlabored breathing at rest. Cardiac: Regular rate and rhythm, no S3 or significant systolic murmur, no pericardial rub. Abdomen: Soft, nontender, no hepatomegaly, bowel sounds present, no guarding or rebound. Extremities: No pitting edema, distal pulses 2+. Skin: Warm and dry. Musculoskeletal: No kyphosis. Neuropsychiatric: Alert and oriented x3, affect grossly appropriate.  Recent Labwork: 09/25/2023: ALT 33; AST 76;  BUN 9; Creatinine, Ser 1.34; Hemoglobin 13.0; Platelets 284; Potassium 4.3; Sodium 142; TSH 3.160     Component Value Date/Time   CHOL 159 09/25/2023 1611   TRIG 113 09/25/2023 1611   HDL 64 09/25/2023 1611   CHOLHDL 2.5 09/25/2023 1611   CHOLHDL 3.9 03/26/2022 0628   VLDL UNABLE TO CALCULATE IF TRIGLYCERIDE OVER 400 mg/dL 91/98/7976 9371   LDLCALC 75 09/25/2023 1611   LDLCALC 68 05/17/2020 1444   LDLDIRECT 30 03/27/2022 0641     Assessment and Plan:  Persistent atrial fibrillation - New diagnosis since July 2023. - Symptomatic with palpitations and SOB. - Daily alcohol use. - Prior echo from 2023 showed normal LVEF, mildly reduced RV systolic function, normal RV size, severe pulmonary hypertension, moderate LA enlargement, mild MR, moderate TR (multiple jets present), CVP 15 mmHg.  Repeat echocardiogram. - Prior history of acute embolic CVA in 2023. - Currently not on any rate controlling agents. - Continue Eliquis  5 mg twice daily.  He does have frequent falls, sometimes once a week.  He is in the process of quitting alcohol.  Acute embolic CVA in 2023 - Acute infarcts in the right frontal, left posterior frontal and right occipital lobe cortex in 2023.  Likely embolic in etiology.  Likely from persistent A-fib.  Continue Eliquis  5 mg twice daily.  But due to frequent falls from alcohol use, will need to avoid.  Referred him to alcohol rehab program.  Hopefully he will avoid alcohol use.   Chronic diastolic heart failure - Intermittently short of breath for the last few years.  Volume overloaded based on 2023 echocardiogram.  Will repeat echocardiogram.  Currently not on any diuretics.  Alcohol use - Daily alcohol use.  Counseling provided.  Will refer him to alcohol rehab program.  40 minutes spent in reviewing prior medical records, more than 3 labs, discussion and documentation.  Medication Adjustments/Labs and Tests Ordered: Current medicines are reviewed at length with  the patient today.  Concerns regarding medicines are outlined above.    Disposition:  Follow up 3 months  Signed, Benigna Delisi Arleta Maywood, MD, 07/14/2024 2:21 PM    [facility] at [facility] [address], [place], [place] [address]

## 2024-07-15 ENCOUNTER — Telehealth (HOSPITAL_COMMUNITY): Payer: Self-pay | Admitting: Licensed Clinical Social Worker

## 2024-07-15 DIAGNOSIS — F1010 Alcohol abuse, uncomplicated: Secondary | ICD-10-CM

## 2024-07-15 MED ORDER — APIXABAN 5 MG PO TABS: 5.0000 mg | ORAL_TABLET | Freq: Two times a day (BID) | ORAL | 1 refills | Status: AC

## 2024-07-15 NOTE — Addendum Note (Signed)
 Addended by: MEMORY DELON POUR on: 07/15/2024 07:34 AM   Modules accepted: Orders

## 2024-07-15 NOTE — Telephone Encounter (Signed)
 H&V Care Navigation CSW Progress Note  Clinical Social Worker consulted to speak with pt regarding current alcohol use.  Pt states that he drinks 2-3 liquor drinks daily and sometimes will have a drink in the morning.  States he has been drinking on and off for many years- cannot think of when it started.  Drinking more right now due to stress with nephew being ill with cancer.  Pt has never engaged in inpatient or outpatient rehab program but is agreeable to information regarding these options- request information be mailed to him.  CSW mailing information regarding [facility] in [place] for substance use and mental health services as well as list of local AA meetings.  Patient also agreeable to [facility] referral for substance use concerns to have telephonic follow up regarding his alcohol abuse and to assist in setting goals to reduce use with eventual goal of sobriety.  SDOH Screenings   Food Insecurity: No Food Insecurity (02/03/2024)  Housing: Low Risk  (02/03/2024)  Transportation Needs: No Transportation Needs (02/03/2024)  Utilities: Not At Risk (02/03/2024)  Alcohol Screen: Low Risk  (07/15/2024)  Depression (PHQ2-9): Low Risk  (02/03/2024)  Financial Resource Strain: Low Risk  (02/03/2024)  Physical Activity: Sufficiently Active (02/03/2024)  Social Connections: Unknown (02/03/2024)  Stress: No Stress Concern Present (02/03/2024)  Tobacco Use: High Risk (07/14/2024)  Health Literacy: Inadequate Health Literacy (02/03/2024)   Pt reports no further needs at this time  Alexander Heslop H. Gerlean Cid, LCSW Clinical Social Worker [facility] Desk#: [phone] Cell#: [phone]

## 2024-07-15 NOTE — Telephone Encounter (Signed)
 Prescription refill request for Eliquis  received. Indication: Last office visit: 07/14/24 Scr:  1.34 09/25/23 Age: 68 Weight: 60.9 kg

## 2024-07-16 ENCOUNTER — Telehealth: Payer: Self-pay

## 2024-07-16 NOTE — Progress Notes (Signed)
 Complex Care Management Note  Care Guide Note 07/16/2024 Name: Alexander Simmons MRN: [id number] DOB: 01-20-1956  Alexander Simmons is a 68 y.o. year old male who sees Antonetta, Rollene BRAVO, MD for primary care. I reached out to Alexander Simmons by phone today to offer complex care management services.  Mr. Sahni was given information about Complex Care Management services today including:   The Complex Care Management services include support from the care team which includes your Nurse Care Manager, Clinical Social Worker, or Pharmacist.  The Complex Care Management team is here to help remove barriers to the health concerns and goals most important to you. Complex Care Management services are voluntary, and the patient may decline or stop services at any time by request to their care team member.   Complex Care Management Consent Status: Patient agreed to services and verbal consent obtained.   Follow up plan:  Telephone appointment with complex care management team member scheduled for:  08/05/2024  Encounter Outcome:  Patient Scheduled

## 2024-07-27 ENCOUNTER — Other Ambulatory Visit: Payer: Self-pay | Admitting: Family Medicine

## 2024-07-27 DIAGNOSIS — I10 Essential (primary) hypertension: Secondary | ICD-10-CM

## 2024-08-05 ENCOUNTER — Other Ambulatory Visit: Payer: Self-pay | Admitting: Licensed Clinical Social Worker

## 2024-08-05 ENCOUNTER — Ambulatory Visit (HOSPITAL_COMMUNITY): Admission: RE | Admit: 2024-08-05 | Discharge: 2024-08-05 | Attending: Internal Medicine

## 2024-08-05 DIAGNOSIS — I4891 Unspecified atrial fibrillation: Secondary | ICD-10-CM | POA: Insufficient documentation

## 2024-08-05 LAB — ECHOCARDIOGRAM COMPLETE
AR max vel: 2.22 cm2
AV Area VTI: 2.25 cm2
AV Area mean vel: 2.46 cm2
AV Mean grad: 2.6 mmHg
AV Peak grad: 5.8 mmHg
Ao pk vel: 1.2 m/s
Area-P 1/2: 3.65 cm2
Calc EF: 42.4 %
S' Lateral: 3.4 cm
Single Plane A2C EF: 38.3 %
Single Plane A4C EF: 45.9 %

## 2024-08-05 NOTE — Patient Instructions (Signed)
 Visit Information  Thank you for taking time to visit with me today. Please don't hesitate to contact me if I can be of assistance to you before our next scheduled appointment.  Your rescheduled initial care management appointment is by telephone on 09/07/23 at 11 am  Telephone follow-up in 1 month  Please call the care guide team at [phone] if you need to cancel, schedule, or reschedule an appointment.   Please call the Suicide and Crisis Lifeline: 988 call the [place]  National Suicide Prevention Lifeline: (365) 173-2060 or TTY: 531-663-7721 TTY 253-559-5981) to talk to a trained counselor call 1-800-273-TALK (toll free, 24 hour hotline) go to [facility] [address] [place] [phone]) call the [facility] Crisis Line: 608-710-2427 call 911 if you are experiencing a Mental Health or Behavioral Health Crisis or need someone to talk to.  Lyle Rung, BSW, MSW, LCSW Licensed Clinical Social Worker [employer]   [email or web address] Direct Dial: [phone]

## 2024-08-05 NOTE — Progress Notes (Signed)
*  PRELIMINARY RESULTS* Echocardiogram 2D Echocardiogram has been performed.  Teresa Aida PARAS 08/05/2024, 2:08 PM

## 2024-08-20 ENCOUNTER — Ambulatory Visit: Payer: Self-pay | Admitting: Internal Medicine

## 2024-08-29 ENCOUNTER — Other Ambulatory Visit: Payer: Self-pay | Admitting: Family Medicine

## 2024-09-01 NOTE — Progress Notes (Signed)
 "  Cardiology Office Note    Date:  09/03/2024  ID:  Alexander, Simmons Sep 14, 1955, MRN [id number] Cardiologist: Diannah SHAUNNA Maywood, MD { : History of Present Illness:    Alexander Simmons is a 69 y.o. male with past medical history of persistent atrial fibrillation, HTN, HLD, glaucoma, prior CVA and alcohol use who presents to the office today for follow-up of his abnormal echocardiogram results.  He was last examined by Dr. Maywood in 06/2024 and had been in atrial fibrillation since at least 02/2022 and did report associated palpitations and shortness of breath. He was currently not on rate-controlling agents and was continued on Eliquis  for anticoagulation. A repeat echocardiogram was recommended for initial assessment and he was referred to an alcohol rehab program.  His echocardiogram was performed in 07/2024 and showed his EF was mildly reduced at 45 to 50% but was noted to have wall motion abnormalities with the entire anterior septum and mid inferior septal segment being hypokinetic. Was also noted to have mildly reduced RV function, moderate to severe TR, mild MR and mild AI. A follow-up appointment was recommended to arrange for an LHC.  In talking with the patient today, he reports having dyspnea on exertion over the past several months which typically occurs when exerting himself such as doing chores around the house or walking his dogs. Denies any associated chest pain or palpitations with this. He has overall been asymptomatic with his atrial fibrillation. No recent orthopnea, PND or pitting edema. Says that he has reduced his alcohol use to approximately 1 drink per day. Reports good compliance with his medications, including Eliquis . He does use a pill pack which his niece helps him with each week.  Studies Reviewed:   EKG: EKG is ordered today and demonstrates:   EKG Interpretation Date/Time:  Thursday September 02 2024 09:04:45 EST Ventricular Rate:  100 PR Interval:    QRS  Duration:  80 QT Interval:  352 QTC Calculation: 454 R Axis:   102  Text Interpretation: Atrial fibrillation with premature ventricular or aberrantly conducted complexes Rightward axis TWI along inferior and lateral leads. Confirmed by [name] ([id number]) on 09/02/2024 9:07:08 AM       Echocardiogram: 08/05/2024 IMPRESSIONS     1. Left ventricular ejection fraction, by estimation, is 45 to 50%. The  left ventricle has mildly decreased function. The left ventricle  demonstrates regional wall motion abnormalities (see scoring  diagram/findings for description). Left ventricular  diastolic parameters are indeterminate.   2. Right ventricular systolic function is mildly reduced. The right  ventricular size is mildly enlarged. There is normal pulmonary artery  systolic pressure.   3. Left atrial size was mild to moderately dilated.   4. Right atrial size was severely dilated.   5. The mitral valve is abnormal. Mild mitral valve regurgitation. No  evidence of mitral stenosis.   6. The tricuspid valve is abnormal. Tricuspid valve regurgitation is  moderate to severe.   7. The aortic valve is tricuspid. Aortic valve regurgitation is mild.   8. The inferior vena cava is normal in size with greater than 50%  respiratory variability, suggesting right atrial pressure of 3 mmHg.    Risk Assessment/Calculations:   CHA2DS2-VASc Score = 5  This indicates a 7.2% annual risk of stroke. The patient's score is based upon: CHF History: 1 HTN History: 1 Diabetes History: 0 Stroke History: 2 Vascular Disease History: 0 Age Score: 1 Gender Score: 0   Physical Exam:   VS:  BP 120/70 (BP Location: Left Arm, Cuff Size: Normal)   Pulse 76   Ht 5' 1 (1.549 m)   Wt 135 lb 9.6 oz (61.5 kg)   SpO2 99%   BMI 25.62 kg/m    Wt Readings from Last 3 Encounters:  09/02/24 135 lb 9.6 oz (61.5 kg)  07/14/24 134 lb 3.2 oz (60.9 kg)  02/03/24 145 lb (65.8 kg)     GEN: Well nourished, well  developed male appearing in no acute distress NECK: No JVD; No carotid bruits CARDIAC: Irregularly irregular, no murmurs, rubs, gallops RESPIRATORY:  Clear to auscultation without rales, wheezing or rhonchi  ABDOMEN: Appears non-distended. No obvious abdominal masses. EXTREMITIES: No clubbing or cyanosis. No pitting edema.  Distal pedal pulses are 2+ bilaterally.   Assessment and Plan:   1. Heart failure with mildly reduced ejection fraction (HFmrEF) (HCC) - Recent echocardiogram showed his EF was reduced at 45 to 50% with wall motion abnormalities as outlined above along with moderate to severe TR. Dr. Mallipeddi did recommend a LHC for definitive evaluation. Reviewed the procedure with the patient today along with risks and benefits. He is in agreement to proceed. Will recheck a CBC and BMET today. He has not been on ASA given the need for anticoagulation. Will hold Eliquis  for 48 hours prior to his cath. Will add Toprol -XL 25 mg daily in the setting of his cardiomyopathy and atrial fibrillation. Based off catheterization results and follow-up labs, can add an SGLT2i.  2. Atrial fibrillation with controlled ventricular response (HCC) - His heart rate is in the 90's to low 100's during today's visit. Question if this is possibly contributing to his cardiomyopathy. Will add Toprol -XL 25 mg daily to help with his rates and also in the setting of his cardiomyopathy. - Continue Eliquis  5 mg twice daily for anticoagulation which is the correct dose given his current age (69 yo), weight (135 lbs) and renal function (creatinine at 1.34 in 08/2023). Will recheck a CBC and BMET.   3. Alcohol Use - He has reduced his alcohol intake to approximately 1 standard drink per day. Was congratulated on his reduction with cessation advised.  4. Essential hypertension - BP is well-controlled at 120/70 during today's visit. Continue Amlodipine  5 mg daily and will add Toprol -XL 25 mg daily as discussed  above.  Informed Consent   Shared Decision Making/Informed Consent{  The risks [stroke (1 in 1000), death (1 in 1000), kidney failure [usually temporary] (1 in 500), bleeding (1 in 200), allergic reaction [possibly serious] (1 in 200)], benefits (diagnostic support and management of coronary artery disease) and alternatives of a cardiac catheterization were discussed in detail with Mr. Bontempo and he is willing to proceed.     Signed, Laymon CHRISTELLA Qua, PA-C   "

## 2024-09-01 NOTE — H&P (View-Only) (Signed)
 "  Cardiology Office Note    Date:  09/03/2024  ID:  Yousif, Edelson Sep 14, 1955, MRN [id number] Cardiologist: Diannah SHAUNNA Maywood, MD { : History of Present Illness:    Alexander Simmons is a 69 y.o. male with past medical history of persistent atrial fibrillation, HTN, HLD, glaucoma, prior CVA and alcohol use who presents to the office today for follow-up of his abnormal echocardiogram results.  He was last examined by Dr. Maywood in 06/2024 and had been in atrial fibrillation since at least 02/2022 and did report associated palpitations and shortness of breath. He was currently not on rate-controlling agents and was continued on Eliquis  for anticoagulation. A repeat echocardiogram was recommended for initial assessment and he was referred to an alcohol rehab program.  His echocardiogram was performed in 07/2024 and showed his EF was mildly reduced at 45 to 50% but was noted to have wall motion abnormalities with the entire anterior septum and mid inferior septal segment being hypokinetic. Was also noted to have mildly reduced RV function, moderate to severe TR, mild MR and mild AI. A follow-up appointment was recommended to arrange for an LHC.  In talking with the patient today, he reports having dyspnea on exertion over the past several months which typically occurs when exerting himself such as doing chores around the house or walking his dogs. Denies any associated chest pain or palpitations with this. He has overall been asymptomatic with his atrial fibrillation. No recent orthopnea, PND or pitting edema. Says that he has reduced his alcohol use to approximately 1 drink per day. Reports good compliance with his medications, including Eliquis . He does use a pill pack which his niece helps him with each week.  Studies Reviewed:   EKG: EKG is ordered today and demonstrates:   EKG Interpretation Date/Time:  Thursday September 02 2024 09:04:45 EST Ventricular Rate:  100 PR Interval:    QRS  Duration:  80 QT Interval:  352 QTC Calculation: 454 R Axis:   102  Text Interpretation: Atrial fibrillation with premature ventricular or aberrantly conducted complexes Rightward axis TWI along inferior and lateral leads. Confirmed by [name] ([id number]) on 09/02/2024 9:07:08 AM       Echocardiogram: 08/05/2024 IMPRESSIONS     1. Left ventricular ejection fraction, by estimation, is 45 to 50%. The  left ventricle has mildly decreased function. The left ventricle  demonstrates regional wall motion abnormalities (see scoring  diagram/findings for description). Left ventricular  diastolic parameters are indeterminate.   2. Right ventricular systolic function is mildly reduced. The right  ventricular size is mildly enlarged. There is normal pulmonary artery  systolic pressure.   3. Left atrial size was mild to moderately dilated.   4. Right atrial size was severely dilated.   5. The mitral valve is abnormal. Mild mitral valve regurgitation. No  evidence of mitral stenosis.   6. The tricuspid valve is abnormal. Tricuspid valve regurgitation is  moderate to severe.   7. The aortic valve is tricuspid. Aortic valve regurgitation is mild.   8. The inferior vena cava is normal in size with greater than 50%  respiratory variability, suggesting right atrial pressure of 3 mmHg.    Risk Assessment/Calculations:   CHA2DS2-VASc Score = 5  This indicates a 7.2% annual risk of stroke. The patient's score is based upon: CHF History: 1 HTN History: 1 Diabetes History: 0 Stroke History: 2 Vascular Disease History: 0 Age Score: 1 Gender Score: 0   Physical Exam:   VS:  BP 120/70 (BP Location: Left Arm, Cuff Size: Normal)   Pulse 76   Ht 5' 1 (1.549 m)   Wt 135 lb 9.6 oz (61.5 kg)   SpO2 99%   BMI 25.62 kg/m    Wt Readings from Last 3 Encounters:  09/02/24 135 lb 9.6 oz (61.5 kg)  07/14/24 134 lb 3.2 oz (60.9 kg)  02/03/24 145 lb (65.8 kg)     GEN: Well nourished, well  developed male appearing in no acute distress NECK: No JVD; No carotid bruits CARDIAC: Irregularly irregular, no murmurs, rubs, gallops RESPIRATORY:  Clear to auscultation without rales, wheezing or rhonchi  ABDOMEN: Appears non-distended. No obvious abdominal masses. EXTREMITIES: No clubbing or cyanosis. No pitting edema.  Distal pedal pulses are 2+ bilaterally.   Assessment and Plan:   1. Heart failure with mildly reduced ejection fraction (HFmrEF) (HCC) - Recent echocardiogram showed his EF was reduced at 45 to 50% with wall motion abnormalities as outlined above along with moderate to severe TR. Dr. Mallipeddi did recommend a LHC for definitive evaluation. Reviewed the procedure with the patient today along with risks and benefits. He is in agreement to proceed. Will recheck a CBC and BMET today. He has not been on ASA given the need for anticoagulation. Will hold Eliquis  for 48 hours prior to his cath. Will add Toprol -XL 25 mg daily in the setting of his cardiomyopathy and atrial fibrillation. Based off catheterization results and follow-up labs, can add an SGLT2i.  2. Atrial fibrillation with controlled ventricular response (HCC) - His heart rate is in the 90's to low 100's during today's visit. Question if this is possibly contributing to his cardiomyopathy. Will add Toprol -XL 25 mg daily to help with his rates and also in the setting of his cardiomyopathy. - Continue Eliquis  5 mg twice daily for anticoagulation which is the correct dose given his current age (69 yo), weight (135 lbs) and renal function (creatinine at 1.34 in 08/2023). Will recheck a CBC and BMET.   3. Alcohol Use - He has reduced his alcohol intake to approximately 1 standard drink per day. Was congratulated on his reduction with cessation advised.  4. Essential hypertension - BP is well-controlled at 120/70 during today's visit. Continue Amlodipine  5 mg daily and will add Toprol -XL 25 mg daily as discussed  above.  Informed Consent   Shared Decision Making/Informed Consent{  The risks [stroke (1 in 1000), death (1 in 1000), kidney failure [usually temporary] (1 in 500), bleeding (1 in 200), allergic reaction [possibly serious] (1 in 200)], benefits (diagnostic support and management of coronary artery disease) and alternatives of a cardiac catheterization were discussed in detail with Alexander Simmons and he is willing to proceed.     Signed, Laymon CHRISTELLA Qua, PA-C   "

## 2024-09-02 ENCOUNTER — Ambulatory Visit: Attending: Student | Admitting: Student

## 2024-09-02 ENCOUNTER — Ambulatory Visit: Payer: Self-pay | Admitting: Student

## 2024-09-02 ENCOUNTER — Other Ambulatory Visit (HOSPITAL_COMMUNITY)
Admission: RE | Admit: 2024-09-02 | Discharge: 2024-09-02 | Disposition: A | Discharge status: Home / Self Care | Source: Ambulatory Visit | Attending: Student | Admitting: Student

## 2024-09-02 ENCOUNTER — Encounter: Payer: Self-pay | Admitting: Student

## 2024-09-02 VITALS — BP 120/70 | HR 76 | Ht 61.0 in | Wt 135.6 lb

## 2024-09-02 DIAGNOSIS — I5020 Unspecified systolic (congestive) heart failure: Secondary | ICD-10-CM

## 2024-09-02 DIAGNOSIS — I4891 Unspecified atrial fibrillation: Secondary | ICD-10-CM

## 2024-09-02 DIAGNOSIS — I10 Essential (primary) hypertension: Secondary | ICD-10-CM | POA: Diagnosis not present

## 2024-09-02 DIAGNOSIS — Z01818 Encounter for other preprocedural examination: Secondary | ICD-10-CM | POA: Diagnosis present

## 2024-09-02 LAB — BASIC METABOLIC PANEL WITH GFR
Anion gap: 16 — ABNORMAL HIGH (ref 5–15)
BUN: 10 mg/dL (ref 8–23)
CO2: 23 mmol/L (ref 22–32)
Calcium: 9.4 mg/dL (ref 8.9–10.3)
Chloride: 100 mmol/L (ref 98–111)
Creatinine, Ser: 1.02 mg/dL (ref 0.61–1.24)
GFR, Estimated: >60 mL/min
Glucose, Bld: 90 mg/dL (ref 70–99)
Potassium: 4.1 mmol/L (ref 3.5–5.1)
Sodium: 138 mmol/L (ref 135–145)

## 2024-09-02 LAB — CBC
HCT: 36.2 % — ABNORMAL LOW (ref 39.0–52.0)
Hemoglobin: 12 g/dL — ABNORMAL LOW (ref 13.0–17.0)
MCH: 34.2 pg — ABNORMAL HIGH (ref 26.0–34.0)
MCHC: 33.1 g/dL (ref 30.0–36.0)
MCV: 103.1 fL — ABNORMAL HIGH (ref 80.0–100.0)
Platelets: 158 K/uL (ref 150–400)
RBC: 3.51 MIL/uL — ABNORMAL LOW (ref 4.22–5.81)
RDW: 14.4 % (ref 11.5–15.5)
WBC: 3.5 K/uL — ABNORMAL LOW (ref 4.0–10.5)
nRBC: 0 % (ref 0.0–0.2)

## 2024-09-02 MED ORDER — METOPROLOL SUCCINATE ER 25 MG PO TB24: 25.0000 mg | ORAL_TABLET | Freq: Every day | ORAL | 3 refills | Status: AC

## 2024-09-02 NOTE — Patient Instructions (Signed)
 Medication Instructions:  Your physician has recommended you make the following change in your medication:   Start Toprol  XL 25 mg Daily   Hold Eliquis  2 Days prior to Heart Cath. On 09/09/24   *If you need a refill on your cardiac medications before your next appointment, please call your pharmacy*  Lab Work: Your physician recommends that you return for lab work in: Today ( CBC, BMET)   Please have this done at [facility] (hours-Monday through Friday from 8:00 am to 4:00 pm except 11:30 am to 12:10 pm)  If you have labs (blood work) drawn today and your tests are completely normal, you will receive your results only by: MyChart Message (if you have MyChart) OR A paper copy in the mail If you have any lab test that is abnormal or we need to change your treatment, we will call you to review the results.  Testing/Procedures: Your physician has requested that you have a cardiac catheterization. Cardiac catheterization is used to diagnose and/or treat various heart conditions. Doctors may recommend this procedure for a number of different reasons. The most common reason is to evaluate chest pain. Chest pain can be a symptom of coronary artery disease (CAD), and cardiac catheterization can show whether plaque is narrowing or blocking your hearts arteries. This procedure is also used to evaluate the valves, as well as measure the blood flow and oxygen levels in different parts of your heart. For further information please visit [email or web address] Please follow instruction sheet, as given.   Follow-Up: At [facility], you and your health needs are our priority.  As part of our continuing mission to provide you with exceptional heart care, our providers are all part of one team.  This team includes your primary Cardiologist (physician) and Advanced Practice Providers or APPs (Physician Assistants and Nurse Practitioners) who all work together to provide you with the care you need,  when you need it.  Your next appointment:   1 month(s)  Provider:   You may see Vishnu P Mallipeddi, MD or one of the following Advanced Practice Providers on your designated Care Team:   Brittany Strader, PA-C  [place], [place] Olivia Pavy, [place]     We recommend signing up for the patient portal called MyChart.  Sign up information is provided on this After Visit Summary.  MyChart is used to connect with patients for Virtual Visits (Telemedicine).  Patients are able to view lab/test results, encounter notes, upcoming appointments, etc.  Non-urgent messages can be sent to your provider as well.   To learn more about what you can do with MyChart, go to [email or web address].   Other Instructions Thank you for choosing [facility]!           [facility] A DEPT OF [facility] [address] [place] [address] Dept: [phone] Loc: [phone]  EMMAUS BRANDI  09/02/2024  You are scheduled for a Cardiac Catheterization on Thursday, January 15 with Dr. Lonni End.  1. Please arrive at the [facility] (Main Entrance A) at [facility]: [address] [place], [place] [address] at 5:30 AM (This time is 2 hour(s) before your procedure to ensure your preparation).   Free valet parking service is available. You will check in at ADMITTING. The support person will be asked to wait in the waiting room.  It is OK to have someone drop you off and come back  when you are ready to be discharged.    Special note: Every effort is made to have your procedure done on time. Please understand that emergencies sometimes delay scheduled procedures.  2. Diet: Nothing to eat after midnight.   3. Hydration: You need to be well hydrated before your procedure. On January 15, you may drink approved liquids (see below) until 2 hours before the procedure, with 16 oz of water  as your last intake.   List of  approved liquids water , clear juice, clear tea, black coffee, fruit juices, non-citric and without pulp, carbonated beverages, Gatorade, Kool -Aid, plain Jello-O and plain ice popsicles.  4. Labs: You will need to have blood drawn on Thursday, January 8 at [facility]. You do not need to be fasting.  5. Medication instructions in preparation for your procedure:   Contrast Allergy: No  Stop taking Eliquis  (Apixiban) on Monday, January 12.  On the morning of your procedure, take your Aspirin  81 mg and any morning medicines NOT listed above.  You may use sips of water .  6. Plan to go home the same day, you will only stay overnight if medically necessary. 7. Bring a current list of your medications and current insurance cards. 8. You MUST have a responsible person to drive you home. 9. Someone MUST be with you the first 24 hours after you arrive home or your discharge will be delayed. 10. Please wear clothes that are easy to get on and off and wear slip-on shoes.  Thank you for allowing us  to care for you!   --  Invasive Cardiovascular services

## 2024-09-03 ENCOUNTER — Encounter: Payer: Self-pay | Admitting: Student

## 2024-09-06 ENCOUNTER — Other Ambulatory Visit: Payer: Self-pay | Admitting: Licensed Clinical Social Worker

## 2024-09-06 NOTE — Patient Outreach (Signed)
 Complex Care Management   Visit Note  09/06/2024  Name:  Alexander Simmons MRN: [id number] DOB: 1956-02-16  Situation: Referral received for Complex Care Management related to Mental/Behavioral Health diagnosis Alcohol Abuse. I obtained verbal consent from Patient.  Visit completed with Patient  on the phone Patient reports that he is no longer interested in CCM program enrollment and does not wish to gian alcohol abuse treatment or resources at this time. AA list offered to be sent but patient declined need but was appreciative of offer and CCM program education provided. Patient agreeable to reach out directly to [facility] LCSW in the future in the case that he changes his mind.  Background:   Past Medical History:  Diagnosis Date   Alcohol abuse    Allergy    Cor pulmonale (HCC)    By echocardiography   Glaucoma    Humerus head fracture-left 2021   Hyperlipidemia    Hypertension    Small bowel obstruction (HCC)    Syncope 12/24/2009   Attributed to use of narcotics plus alcohol   Tobacco abuse    Recommendation:   PCP Follow-up Continue Current Plan of Care  Follow Up Plan:   Closing From:  Complex Care Management Patient declines CCM enrollment at this time  Lyle Rung, BSW, MSW, LCSW Licensed Clinical Social Worker [employer]   [facility] [email or web address] Direct Dial: [phone]

## 2024-09-06 NOTE — Patient Instructions (Signed)
 Visit Information  Thank you for taking time to visit with me today. Please don't hesitate to contact me if I can be of assistance to you in the future!  Closing From: Complex Care Management.  Please call the care guide team at [phone] if you need to cancel, schedule, or reschedule an appointment.   Please call the Suicide and Crisis Lifeline: 988 call the [place]  National Suicide Prevention Lifeline: 629-121-8891 or TTY: 808 585 9055 TTY 808-712-7438) to talk to a trained counselor call 1-800-273-TALK (toll free, 24 hour hotline) go to [facility] [address] [place] [phone]) call the [facility] Crisis Line: (909)361-7919 call 911 if you are experiencing a Mental Health or Behavioral Health Crisis or need someone to talk to.  Lyle Rung, BSW, MSW, LCSW Licensed Clinical Social Worker [employer]   [email or web address] Direct Dial: [phone]

## 2024-09-07 ENCOUNTER — Telehealth: Payer: Self-pay | Admitting: *Deleted

## 2024-09-07 NOTE — Telephone Encounter (Signed)
 Cardiac Catheterization scheduled at [facility] for: Thursday September 09, 2024 7:30 AM Arrival time [facility] Main Entrance A at: 5:30 AM  Diet: -Nothing to eat after midnight.  Hydration: -May drink clear liquids until 2 hours before the procedure.  Approved liquids: Water , clear tea, black coffee, fruit juices-non-citric and without pulp,Gatorade, plain Jello/popsicles.   -Please drink 16 oz of water  2 hours before procedure.  Medication instructions: -Hold:  Eliquis -none 09/07/24 until post procedure -Other usual morning medications can be taken including aspirin  81 mg.  Plan to go home the same day, you will only stay overnight if medically necessary.  You must have responsible adult to drive you home.  Someone must be with you the first 24 hours after you arrive home.  Reviewed procedure instructions with patient.

## 2024-09-09 ENCOUNTER — Ambulatory Visit (HOSPITAL_COMMUNITY)
Admission: RE | Admit: 2024-09-09 | Discharge: 2024-09-09 | Disposition: A | Discharge status: Home / Self Care | Attending: Internal Medicine | Admitting: Internal Medicine

## 2024-09-09 ENCOUNTER — Other Ambulatory Visit: Payer: Self-pay

## 2024-09-09 ENCOUNTER — Encounter (HOSPITAL_COMMUNITY): Admission: RE | Disposition: A | Payer: Self-pay | Source: Home / Self Care | Attending: Internal Medicine

## 2024-09-09 ENCOUNTER — Encounter (HOSPITAL_COMMUNITY): Payer: Self-pay | Admitting: Internal Medicine

## 2024-09-09 DIAGNOSIS — I110 Hypertensive heart disease with heart failure: Secondary | ICD-10-CM | POA: Diagnosis not present

## 2024-09-09 DIAGNOSIS — Z79899 Other long term (current) drug therapy: Secondary | ICD-10-CM | POA: Diagnosis not present

## 2024-09-09 DIAGNOSIS — I5020 Unspecified systolic (congestive) heart failure: Secondary | ICD-10-CM | POA: Diagnosis not present

## 2024-09-09 DIAGNOSIS — I429 Cardiomyopathy, unspecified: Secondary | ICD-10-CM | POA: Diagnosis not present

## 2024-09-09 DIAGNOSIS — I4819 Other persistent atrial fibrillation: Secondary | ICD-10-CM | POA: Diagnosis not present

## 2024-09-09 DIAGNOSIS — R0609 Other forms of dyspnea: Secondary | ICD-10-CM | POA: Insufficient documentation

## 2024-09-09 DIAGNOSIS — Z7901 Long term (current) use of anticoagulants: Secondary | ICD-10-CM | POA: Insufficient documentation

## 2024-09-09 DIAGNOSIS — I4891 Unspecified atrial fibrillation: Secondary | ICD-10-CM | POA: Diagnosis not present

## 2024-09-09 DIAGNOSIS — F1091 Alcohol use, unspecified, in remission: Secondary | ICD-10-CM | POA: Insufficient documentation

## 2024-09-09 DIAGNOSIS — I428 Other cardiomyopathies: Secondary | ICD-10-CM | POA: Diagnosis present

## 2024-09-09 DIAGNOSIS — Z8673 Personal history of transient ischemic attack (TIA), and cerebral infarction without residual deficits: Secondary | ICD-10-CM | POA: Insufficient documentation

## 2024-09-09 HISTORY — PX: LEFT HEART CATH AND CORONARY ANGIOGRAPHY: CATH118249

## 2024-09-09 MED ORDER — SODIUM CHLORIDE 0.9% FLUSH: 3.0000 mL | INTRAVENOUS | Status: DC | PRN

## 2024-09-09 MED ORDER — MIDAZOLAM HCL (PF) 2 MG/2ML IJ SOLN
INTRAMUSCULAR | Status: DC | PRN
  Administered 2024-09-09: 1 mg via INTRAVENOUS

## 2024-09-09 MED ORDER — LIDOCAINE HCL (PF) 1 % IJ SOLN
INTRAMUSCULAR | Status: DC | PRN
  Administered 2024-09-09: 2 mL

## 2024-09-09 MED ORDER — ASPIRIN 81 MG PO CHEW: 81.0000 mg | CHEWABLE_TABLET | ORAL | Status: DC

## 2024-09-09 MED ORDER — SODIUM CHLORIDE 0.9 % IV SOLN: 250.0000 mL | INTRAVENOUS | Status: DC | PRN

## 2024-09-09 MED ORDER — FREE WATER
500.0000 mL | Freq: Once | Status: AC
  Administered 2024-09-09: 500 mL via ORAL

## 2024-09-09 MED ORDER — SODIUM CHLORIDE 0.9% FLUSH: 3.0000 mL | Freq: Two times a day (BID) | INTRAVENOUS | Status: DC

## 2024-09-09 MED ORDER — LABETALOL HCL 5 MG/ML IV SOLN: 10.0000 mg | INTRAVENOUS | Status: DC | PRN

## 2024-09-09 MED ORDER — LIDOCAINE HCL (PF) 1 % IJ SOLN
INTRAMUSCULAR | Status: AC
  Filled 2024-09-09: qty 30

## 2024-09-09 MED ORDER — HEPARIN SODIUM (PORCINE) 1000 UNIT/ML IJ SOLN
INTRAMUSCULAR | Status: AC
  Filled 2024-09-09: qty 10

## 2024-09-09 MED ORDER — MIDAZOLAM HCL 2 MG/2ML IJ SOLN
INTRAMUSCULAR | Status: AC
  Filled 2024-09-09: qty 2

## 2024-09-09 MED ORDER — HYDRALAZINE HCL 20 MG/ML IJ SOLN: 10.0000 mg | INTRAMUSCULAR | Status: DC | PRN

## 2024-09-09 MED ORDER — FENTANYL CITRATE (PF) 100 MCG/2ML IJ SOLN
INTRAMUSCULAR | Status: AC
  Filled 2024-09-09: qty 2

## 2024-09-09 MED ORDER — HEPARIN SODIUM (PORCINE) 1000 UNIT/ML IJ SOLN
INTRAMUSCULAR | Status: DC | PRN
  Administered 2024-09-09: 3500 [IU] via INTRAVENOUS

## 2024-09-09 MED ORDER — IOHEXOL 350 MG/ML SOLN
INTRAVENOUS | Status: DC | PRN
  Administered 2024-09-09: 40 mL

## 2024-09-09 MED ORDER — HEPARIN (PORCINE) IN NACL 1000-0.9 UT/500ML-% IV SOLN
INTRAVENOUS | Status: DC | PRN
  Administered 2024-09-09: 500 mL

## 2024-09-09 MED ORDER — FENTANYL CITRATE (PF) 100 MCG/2ML IJ SOLN
INTRAMUSCULAR | Status: DC | PRN
  Administered 2024-09-09: 25 ug via INTRAVENOUS

## 2024-09-09 MED ORDER — ACETAMINOPHEN 325 MG PO TABS: 650.0000 mg | ORAL_TABLET | ORAL | Status: DC | PRN

## 2024-09-09 MED ORDER — VERAPAMIL HCL 2.5 MG/ML IV SOLN
INTRAVENOUS | Status: AC
  Filled 2024-09-09: qty 2

## 2024-09-09 MED ORDER — VERAPAMIL HCL 2.5 MG/ML IV SOLN
INTRAVENOUS | Status: DC | PRN
  Administered 2024-09-09: 10 mL via INTRA_ARTERIAL

## 2024-09-09 MED ORDER — ONDANSETRON HCL 4 MG/2ML IJ SOLN: 4.0000 mg | Freq: Four times a day (QID) | INTRAMUSCULAR | Status: DC | PRN

## 2024-09-09 MED ORDER — FREE WATER: 250.0000 mL | Freq: Once | Status: DC

## 2024-09-09 NOTE — Brief Op Note (Signed)
 BRIEF CARDIAC CATHETERIZATION NOTE  09/09/2024  10:19 AM  PATIENT:  Alexander Simmons  69 y.o. male  PRE-OPERATIVE DIAGNOSIS:  Cardiomyopathy  POST-OPERATIVE DIAGNOSIS:  Nonischemic cardiomyopathy  PROCEDURE:  Procedures: LEFT HEART CATH AND CORONARY ANGIOGRAPHY (N/A)  SURGEON:  Surgeons and Role:    DEWAINE Mady Bruckner, MD - Primary  FINDINGS: No angiographically significant CAD. Mildly elevated left ventricular filling pressure (LVEDP 16 mmHg).  RECOMMENDATIONS: Continue medical therapy for NICM. Primary prevention of CAD. Resume apixaban  this evening if no evidence of bleeding or vascular injury at catheterization site.  Bruckner Mady, MD [facility]

## 2024-09-09 NOTE — Progress Notes (Signed)
 Pt and sister received discharge instructions, teach back preformed. Iv removed, no complications. Rt radial site is clean dry intact, site is soft, no signs of bleeding. Pt escorted out via wheelchair to sister's vehicle.

## 2024-09-09 NOTE — Discharge Instructions (Signed)

## 2024-09-09 NOTE — Interval H&P Note (Signed)
 History and Physical Interval Note:  09/09/2024 9:33 AM  Alexander Simmons  has presented today for surgery, with the diagnosis of cardiomyopathy.  The various methods of treatment have been discussed with the patient and family. After consideration of risks, benefits and other options for treatment, the patient has consented to  Procedures: LEFT HEART CATH AND CORONARY ANGIOGRAPHY (N/A) as a surgical intervention.  The patient's history has been reviewed, patient examined, no change in status, stable for surgery.  I have reviewed the patient's chart and labs.  Questions were answered to the patient's satisfaction.    Cath Lab Visit (complete for each Cath Lab visit)  Clinical Evaluation Leading to the Procedure:   ACS: No.  Non-ACS:    Anginal/Heart Failure Classification: NYHA class III  Anti-ischemic medical therapy: Maximal Therapy (2 or more classes of medications)  Non-Invasive Test Results: LVEF 45-50% by echo with WMA -> intermediate risk  Prior CABG: No previous CABG  Kaydense Rizo

## 2024-09-14 ENCOUNTER — Ambulatory Visit: Admitting: Family Medicine

## 2024-09-14 ENCOUNTER — Encounter: Payer: Self-pay | Admitting: Family Medicine

## 2024-09-14 VITALS — BP 121/74 | HR 78 | Resp 16 | Ht 61.0 in | Wt 137.0 lb

## 2024-09-14 DIAGNOSIS — E559 Vitamin D deficiency, unspecified: Secondary | ICD-10-CM

## 2024-09-14 DIAGNOSIS — I10 Essential (primary) hypertension: Secondary | ICD-10-CM

## 2024-09-14 DIAGNOSIS — Z720 Tobacco use: Secondary | ICD-10-CM | POA: Diagnosis not present

## 2024-09-14 DIAGNOSIS — E785 Hyperlipidemia, unspecified: Secondary | ICD-10-CM

## 2024-09-14 DIAGNOSIS — R7302 Impaired glucose tolerance (oral): Secondary | ICD-10-CM

## 2024-09-14 DIAGNOSIS — Z125 Encounter for screening for malignant neoplasm of prostate: Secondary | ICD-10-CM

## 2024-09-14 DIAGNOSIS — I5032 Chronic diastolic (congestive) heart failure: Secondary | ICD-10-CM

## 2024-09-14 DIAGNOSIS — I110 Hypertensive heart disease with heart failure: Secondary | ICD-10-CM

## 2024-09-14 DIAGNOSIS — Z23 Encounter for immunization: Secondary | ICD-10-CM | POA: Diagnosis not present

## 2024-09-14 DIAGNOSIS — F1029 Alcohol dependence with unspecified alcohol-induced disorder: Secondary | ICD-10-CM | POA: Diagnosis not present

## 2024-09-14 DIAGNOSIS — F17210 Nicotine dependence, cigarettes, uncomplicated: Secondary | ICD-10-CM

## 2024-09-14 DIAGNOSIS — Z1211 Encounter for screening for malignant neoplasm of colon: Secondary | ICD-10-CM | POA: Diagnosis not present

## 2024-09-14 MED ORDER — AZELASTINE HCL 0.1 % NA SOLN: 2.0000 | Freq: Two times a day (BID) | NASAL | 5 refills | Status: AC

## 2024-09-14 MED ORDER — PANTOPRAZOLE SODIUM 40 MG PO TBEC: 40.0000 mg | DELAYED_RELEASE_TABLET | Freq: Every day | ORAL | 5 refills | Status: AC

## 2024-09-14 NOTE — Assessment & Plan Note (Signed)

## 2024-09-14 NOTE — Assessment & Plan Note (Signed)
 Reports daily alcohol use  though less needs to quit has cardiomyopathy and long h/o complications linked with alcohol dependence

## 2024-09-14 NOTE — Assessment & Plan Note (Signed)
Stable , no s/s of decompensation

## 2024-09-14 NOTE — Assessment & Plan Note (Signed)
 Hyperlipidemia:Low fat diet discussed and encouraged.   Lipid Panel  Lab Results  Component Value Date   CHOL 159 09/25/2023   HDL 64 09/25/2023   LDLCALC 75 09/25/2023   LDLDIRECT 30 03/27/2022   TRIG 113 09/25/2023   CHOLHDL 2.5 09/25/2023     Updated lab needed at/ before next visit.

## 2024-09-14 NOTE — Assessment & Plan Note (Signed)
 Controlled, no change in medication DASH diet and commitment to daily physical activity for a minimum of 30 minutes discussed and encouraged, as a part of hypertension management. The importance of attaining a healthy weight is also discussed.     09/14/2024    3:05 PM 09/09/2024   11:35 AM 09/09/2024   11:10 AM 09/09/2024   10:40 AM 09/09/2024   10:20 AM 09/09/2024   10:07 AM 09/09/2024    9:52 AM  BP/Weight  Systolic BP 121 112 118 113 119 119 116  Diastolic BP 74 82 51 74 92 81 76  Wt. (Lbs) 137        BMI 25.89 kg/m2

## 2024-09-14 NOTE — Assessment & Plan Note (Signed)
 After obtaining informed consent, the influenza vaccine is  administered , with no adverse effect noted at the time of administration.

## 2024-09-14 NOTE — Patient Instructions (Addendum)
 Annual exam in May with MD  Flu vaccine today  Please get covid vaccine at pharmacy in next 1 to 2 weeks  Labs today cBC, lipid, cm and EGFr, tSH, PSA and vit D , you will be called about cholesterol medication   PLEASE stop using alcohol and snuff, not good for your heart and brain   You are referred for colonoscopy , very important you get this done  It is important that you exercise regularly at least 30 minutes 5 times a week. If you develop chest pain, have severe difficulty breathing, or feel very tired, stop exercising immediately and seek medical attention  Thanks for choosing [facility], we consider it a privelige to serve you.

## 2024-09-14 NOTE — Assessment & Plan Note (Signed)
 Updated lab needed at/ before next visit.

## 2024-09-14 NOTE — Progress Notes (Signed)
 "  Alexander Simmons     MRN: [id number]      DOB: 08-12-1956  Chief Complaint  Patient presents with   Hypertension    Follow up     HPI Alexander Simmons is here for follow up and re-evaluation of chronic medical conditions, medication management and review of any available recent lab and radiology data.  Preventive health is updated, specifically  Cancer screening and Immunization.   Questions or concerns regarding consultations or procedures which the PT has had in the interim are  addressed. The PT denies any adverse reactions to current medications since the last visit.  There are no new concerns.  There are no specific complaints   ROS Denies recent fever or chills. Denies sinus pressure, nasal congestion, ear pain or sore throat. Denies chest congestion, productive cough or wheezing. Denies chest pains, palpitations and leg swelling Denies abdominal pain, nausea, vomiting,diarrhea or constipation.   Denies dysuria, frequency, hesitancy or incontinence. Denies joint pain, swelling and limitation in mobility. Denies headaches, seizures, numbness, or tingling. Denies depression, anxiety or insomnia. Denies skin break down or rash.   PE  BP 121/74   Pulse 78   Resp 16   Ht 5' 1 (1.549 m)   Wt 137 lb (62.1 kg)   SpO2 98%   BMI 25.89 kg/m   Patient alert and oriented and in no cardiopulmonary distress.  HEENT: No facial asymmetry, EOMI,     Neck supple .  Chest: Clear to auscultation bilaterally.  CVS: S1, S2 no murmurs, no S3.Regular rate.  ABD: Soft non tender.   Ext: No edema  MS: Adequate ROM spine, shoulders, hips and knees.  Skin: Intact, no ulcerations or rash noted.  Psych: Good eye contact, normal affect. Memory intact not anxious or depressed appearing.  CNS: CN 2-12 intact, power,  normal throughout.no focal deficits noted.   Assessment & Plan  Tobacco abuse Patient counseled for approximately 5 minutes regarding the health risks of ongoing nicotine  use, specifically all types of cancer, heart disease, stroke and respiratory failure. The options available for help with cessation ,the behavioral changes to assist the process, and the option to either gradully reduce usage  Or abruptly stop.is also discussed. Pt is also encouraged to set specific goals in number of cigarettes used daily, as well as to set a quit date.    Immunization due After obtaining informed consent, the influenza  vaccine is  administered , with no adverse effect noted at the time of administration.   Hypertension, essential Controlled, no change in medication DASH diet and commitment to daily physical activity for a minimum of 30 minutes discussed and encouraged, as a part of hypertension management. The importance of attaining a healthy weight is also discussed.     09/14/2024    3:05 PM 09/09/2024   11:35 AM 09/09/2024   11:10 AM 09/09/2024   10:40 AM 09/09/2024   10:20 AM 09/09/2024   10:07 AM 09/09/2024    9:52 AM  BP/Weight  Systolic BP 121 112 118 113 119 119 116  Diastolic BP 74 82 51 74 92 81 76  Wt. (Lbs) 137        BMI 25.89 kg/m2             Vitamin D  deficiency Updated lab needed at/ before next visit.   Hyperlipidemia Hyperlipidemia:Low fat diet discussed and encouraged.   Lipid Panel  Lab Results  Component Value Date   CHOL 159 09/25/2023   HDL  64 09/25/2023   LDLCALC 75 09/25/2023   LDLDIRECT 30 03/27/2022   TRIG 113 09/25/2023   CHOLHDL 2.5 09/25/2023     Updated lab needed at/ before next visit.   Chronic diastolic heart failure (HCC) Stable, no s/s of decompensation  Alcohol dependence (HCC) Reports daily alcohol use  though less needs to quit has cardiomyopathy and long h/o complications linked with alcohol dependence  "

## 2024-09-15 ENCOUNTER — Other Ambulatory Visit: Payer: Self-pay

## 2024-09-15 ENCOUNTER — Ambulatory Visit: Payer: Self-pay | Admitting: Family Medicine

## 2024-09-15 DIAGNOSIS — R972 Elevated prostate specific antigen [PSA]: Secondary | ICD-10-CM

## 2024-09-15 DIAGNOSIS — D649 Anemia, unspecified: Secondary | ICD-10-CM

## 2024-09-15 LAB — CBC WITH DIFFERENTIAL/PLATELET
Basophils Absolute: 0 x10E3/uL (ref 0.0–0.2)
Basos: 0 %
EOS (ABSOLUTE): 0 x10E3/uL (ref 0.0–0.4)
Eos: 1 %
Hematocrit: 37.4 % — ABNORMAL LOW (ref 37.5–51.0)
Hemoglobin: 12.7 g/dL — ABNORMAL LOW (ref 13.0–17.7)
Immature Grans (Abs): 0 x10E3/uL (ref 0.0–0.1)
Immature Granulocytes: 0 %
Lymphocytes Absolute: 1.1 x10E3/uL (ref 0.7–3.1)
Lymphs: 23 %
MCH: 34.9 pg — ABNORMAL HIGH (ref 26.6–33.0)
MCHC: 34 g/dL (ref 31.5–35.7)
MCV: 103 fL — ABNORMAL HIGH (ref 79–97)
Monocytes Absolute: 0.8 x10E3/uL (ref 0.1–0.9)
Monocytes: 16 %
Neutrophils Absolute: 2.9 x10E3/uL (ref 1.4–7.0)
Neutrophils: 59 %
Platelets: 407 x10E3/uL (ref 150–450)
RBC: 3.64 x10E6/uL — ABNORMAL LOW (ref 4.14–5.80)
RDW: 13.7 % (ref 11.6–15.4)
WBC: 4.9 x10E3/uL (ref 3.4–10.8)

## 2024-09-15 LAB — CMP14+EGFR
ALT: 7 IU/L (ref 0–44)
AST: 16 IU/L (ref 0–40)
Albumin: 4 g/dL (ref 3.9–4.9)
Alkaline Phosphatase: 63 IU/L (ref 47–123)
BUN/Creatinine Ratio: 10 (ref 10–24)
BUN: 11 mg/dL (ref 8–27)
Bilirubin Total: 0.5 mg/dL (ref 0.0–1.2)
CO2: 21 mmol/L (ref 20–29)
Calcium: 9.8 mg/dL (ref 8.6–10.2)
Chloride: 100 mmol/L (ref 96–106)
Creatinine, Ser: 1.14 mg/dL (ref 0.76–1.27)
Globulin, Total: 3.6 g/dL (ref 1.5–4.5)
Glucose: 66 mg/dL — ABNORMAL LOW (ref 70–99)
Potassium: 4.9 mmol/L (ref 3.5–5.2)
Sodium: 133 mmol/L — ABNORMAL LOW (ref 134–144)
Total Protein: 7.6 g/dL (ref 6.0–8.5)
eGFR: 69 mL/min/1.73

## 2024-09-15 LAB — LIPID PANEL
Chol/HDL Ratio: 2.9 ratio (ref 0.0–5.0)
Cholesterol, Total: 137 mg/dL (ref 100–199)
HDL: 48 mg/dL
LDL Chol Calc (NIH): 71 mg/dL (ref 0–99)
Triglycerides: 98 mg/dL (ref 0–149)
VLDL Cholesterol Cal: 18 mg/dL (ref 5–40)

## 2024-09-15 LAB — TSH: TSH: 3.76 u[IU]/mL (ref 0.450–4.500)

## 2024-09-15 LAB — VITAMIN D 25 HYDROXY (VIT D DEFICIENCY, FRACTURES): Vit D, 25-Hydroxy: 25.7 ng/mL — ABNORMAL LOW (ref 30.0–100.0)

## 2024-09-15 LAB — PSA: Prostate Specific Ag, Serum: 5.2 ng/mL — ABNORMAL HIGH (ref 0.0–4.0)

## 2024-09-15 MED ORDER — VITAMIN D (ERGOCALCIFEROL) 1.25 MG (50000 UNIT) PO CAPS: 50000.0000 [IU] | ORAL_CAPSULE | ORAL | 1 refills | Status: AC

## 2024-09-15 NOTE — Telephone Encounter (Signed)
 Copied from CRM #[id number]. Topic: General - Other >> Sep 15, 2024  2:24 PM [name] wrote: Reason for CRM: Pt returning Alexander Simmons, CMA call regarding lab results, which were provided. Pt [phone] would like more information about PSA and urologist.

## 2024-09-18 LAB — VITAMIN B12: Vitamin B-12: 425 pg/mL (ref 232–1245)

## 2024-09-18 LAB — IRON

## 2024-09-18 LAB — FERRITIN

## 2024-09-18 LAB — FOLATE: Folate: >20 ng/mL

## 2024-09-18 LAB — SPECIMEN STATUS REPORT

## 2024-09-19 ENCOUNTER — Ambulatory Visit: Payer: Self-pay | Admitting: Family Medicine

## 2024-10-06 ENCOUNTER — Ambulatory Visit: Admitting: Physician Assistant

## 2024-10-14 ENCOUNTER — Ambulatory Visit: Admitting: Internal Medicine

## 2024-12-22 ENCOUNTER — Ambulatory Visit: Admitting: Urology

## 2025-01-11 ENCOUNTER — Encounter: Payer: Self-pay | Admitting: Family Medicine

## 2025-02-07 ENCOUNTER — Ambulatory Visit

## 2025-02-08 ENCOUNTER — Ambulatory Visit
